# Patient Record
Sex: Female | Born: 1970 | Race: Black or African American | Hispanic: No | Marital: Married | Smoking: Former smoker
Health system: Southern US, Community
[De-identification: ages and names within clinical notes are randomized; demographics above are authoritative.]

## PROBLEM LIST (undated history)

## (undated) DIAGNOSIS — K219 Gastro-esophageal reflux disease without esophagitis: Secondary | ICD-10-CM

## (undated) DIAGNOSIS — T7840XA Allergy, unspecified, initial encounter: Secondary | ICD-10-CM

## (undated) DIAGNOSIS — I10 Essential (primary) hypertension: Secondary | ICD-10-CM

## (undated) DIAGNOSIS — F419 Anxiety disorder, unspecified: Secondary | ICD-10-CM

## (undated) DIAGNOSIS — I493 Ventricular premature depolarization: Secondary | ICD-10-CM

## (undated) DIAGNOSIS — E785 Hyperlipidemia, unspecified: Secondary | ICD-10-CM

## (undated) DIAGNOSIS — R000 Tachycardia, unspecified: Secondary | ICD-10-CM

## (undated) HISTORY — DX: Anxiety disorder, unspecified: F41.9

## (undated) HISTORY — DX: Hyperlipidemia, unspecified: E78.5

## (undated) HISTORY — DX: Gastro-esophageal reflux disease without esophagitis: K21.9

## (undated) HISTORY — DX: Allergy, unspecified, initial encounter: T78.40XA

## (undated) HISTORY — DX: Tachycardia, unspecified: R00.0

## (undated) HISTORY — DX: Ventricular premature depolarization: I49.3

## (undated) HISTORY — PX: TUBAL LIGATION: SHX77

## (undated) HISTORY — PX: WISDOM TOOTH EXTRACTION: SHX21

## (undated) HISTORY — PX: ABDOMINAL HYSTERECTOMY: SHX81

---

## 2006-12-27 ENCOUNTER — Ambulatory Visit: Payer: Self-pay | Admitting: Oncology

## 2007-01-26 ENCOUNTER — Ambulatory Visit: Payer: Self-pay | Admitting: Family Medicine

## 2007-01-26 ENCOUNTER — Encounter (INDEPENDENT_AMBULATORY_CARE_PROVIDER_SITE_OTHER): Payer: Self-pay | Admitting: Family Medicine

## 2007-01-26 DIAGNOSIS — I10 Essential (primary) hypertension: Secondary | ICD-10-CM | POA: Insufficient documentation

## 2007-01-28 ENCOUNTER — Encounter (INDEPENDENT_AMBULATORY_CARE_PROVIDER_SITE_OTHER): Payer: Self-pay | Admitting: Family Medicine

## 2007-01-28 DIAGNOSIS — D649 Anemia, unspecified: Secondary | ICD-10-CM | POA: Insufficient documentation

## 2007-01-28 LAB — CONVERTED CEMR LAB
BUN: 15 mg/dL (ref 6–23)
Calcium: 9.1 mg/dL (ref 8.4–10.5)
Chloride: 103 meq/L (ref 96–112)
Hemoglobin: 11.7 g/dL — ABNORMAL LOW (ref 12.0–15.0)
MCHC: 31.9 g/dL (ref 30.0–36.0)
MCV: 86.4 fL (ref 78.0–100.0)
Platelets: 498 10*3/uL — ABNORMAL HIGH (ref 150–400)
Potassium: 3.7 meq/L (ref 3.5–5.3)
RDW: 15.5 % — ABNORMAL HIGH (ref 11.5–14.0)
Saturation Ratios: 25 % (ref 20–55)
Sodium: 139 meq/L (ref 135–145)
TIBC: 430 ug/dL (ref 250–470)
WBC: 11.6 10*3/uL — ABNORMAL HIGH (ref 4.0–10.5)

## 2007-03-21 ENCOUNTER — Ambulatory Visit: Payer: Self-pay | Admitting: Oncology

## 2007-05-10 ENCOUNTER — Ambulatory Visit: Payer: Self-pay | Admitting: Oncology

## 2007-05-10 LAB — CHCC SMEAR

## 2007-05-10 LAB — CBC WITH DIFFERENTIAL/PLATELET
BASO%: 0.3 % (ref 0.0–2.0)
Eosinophils Absolute: 0.1 10*3/uL (ref 0.0–0.5)
HCT: 35.8 % (ref 34.8–46.6)
LYMPH%: 27.5 % (ref 14.0–48.0)
MCH: 28.5 pg (ref 26.0–34.0)
MCHC: 34.3 g/dL (ref 32.0–36.0)
MONO#: 0.4 10*3/uL (ref 0.1–0.9)
MONO%: 4.3 % (ref 0.0–13.0)
NEUT#: 5.7 10*3/uL (ref 1.5–6.5)
Platelets: 515 10*3/uL — ABNORMAL HIGH (ref 145–400)
RDW: 15.1 % — ABNORMAL HIGH (ref 11.3–14.5)

## 2007-05-10 LAB — ERYTHROCYTE SEDIMENTATION RATE: Sed Rate: 27 mm/hr (ref 0–30)

## 2007-05-10 LAB — IRON AND TIBC: UIBC: 358 ug/dL

## 2007-08-09 ENCOUNTER — Ambulatory Visit: Payer: Self-pay | Admitting: Oncology

## 2007-10-11 ENCOUNTER — Ambulatory Visit: Payer: Self-pay | Admitting: Oncology

## 2007-11-23 ENCOUNTER — Emergency Department (HOSPITAL_COMMUNITY): Admission: EM | Admit: 2007-11-23 | Discharge: 2007-11-23 | Payer: Self-pay | Admitting: Family Medicine

## 2007-11-24 ENCOUNTER — Inpatient Hospital Stay (HOSPITAL_COMMUNITY): Admission: EM | Admit: 2007-11-24 | Discharge: 2007-11-26 | Payer: Self-pay | Admitting: Emergency Medicine

## 2007-12-02 ENCOUNTER — Encounter: Admission: RE | Admit: 2007-12-02 | Discharge: 2007-12-02 | Payer: Self-pay | Admitting: Internal Medicine

## 2007-12-19 ENCOUNTER — Inpatient Hospital Stay (HOSPITAL_COMMUNITY): Admission: EM | Admit: 2007-12-19 | Discharge: 2007-12-22 | Payer: Self-pay | Admitting: Emergency Medicine

## 2008-01-16 ENCOUNTER — Encounter (INDEPENDENT_AMBULATORY_CARE_PROVIDER_SITE_OTHER): Payer: Self-pay | Admitting: *Deleted

## 2008-01-16 ENCOUNTER — Ambulatory Visit (HOSPITAL_COMMUNITY): Admission: RE | Admit: 2008-01-16 | Discharge: 2008-01-17 | Payer: Self-pay | Admitting: Obstetrics

## 2009-09-08 ENCOUNTER — Emergency Department (HOSPITAL_COMMUNITY): Admission: EM | Admit: 2009-09-08 | Discharge: 2009-09-08 | Payer: Self-pay | Admitting: Family Medicine

## 2010-02-12 ENCOUNTER — Encounter: Admission: RE | Admit: 2010-02-12 | Discharge: 2010-02-12 | Payer: Self-pay | Admitting: Emergency Medicine

## 2010-07-27 ENCOUNTER — Encounter: Payer: Self-pay | Admitting: Internal Medicine

## 2010-08-24 ENCOUNTER — Encounter: Payer: Self-pay | Admitting: *Deleted

## 2010-09-02 ENCOUNTER — Inpatient Hospital Stay (INDEPENDENT_AMBULATORY_CARE_PROVIDER_SITE_OTHER)
Admission: RE | Admit: 2010-09-02 | Discharge: 2010-09-02 | Disposition: A | Discharge status: Home / Self Care | Payer: No Typology Code available for payment source | Source: Ambulatory Visit | Attending: Emergency Medicine | Admitting: Emergency Medicine

## 2010-09-02 ENCOUNTER — Ambulatory Visit (INDEPENDENT_AMBULATORY_CARE_PROVIDER_SITE_OTHER): Payer: No Typology Code available for payment source

## 2010-09-02 DIAGNOSIS — S20219A Contusion of unspecified front wall of thorax, initial encounter: Secondary | ICD-10-CM

## 2010-09-02 DIAGNOSIS — S139XXA Sprain of joints and ligaments of unspecified parts of neck, initial encounter: Secondary | ICD-10-CM

## 2010-11-18 NOTE — Op Note (Signed)
NAME:  [name], [name] NO.:  [id number]   MEDICAL RECORD NO.:  [id number]          PATIENT TYPE:  INP   LOCATION:  1340                         FACILITY:  [facility]   PHYSICIAN:  Graylin Shiver, M.D.   DATE OF BIRTH:  [birth year]   DATE OF PROCEDURE:  12/20/2007  DATE OF DISCHARGE:                               OPERATIVE REPORT   PROCEDURE:  Esophagogastroduodenoscopy   INDICATIONS FOR PROCEDURE:  The patient is a 40 year old black female  who presented to the hospital with epigastric abdominal pain.  She had  had this pain previously when she was seen by Dr. Matthias Hughs.  A prior CT  of the abdomen was done during her last admission which showed small-  bowel wall thickening compatible with enteritis.  It was felt to be  either infectious or inflammatory.  The patient represented to the  hospital, again, showing similar but worsening findings on CT scan with  enteritis extending from proximal jejunum and down further into the  jejunum.  She continues to experience epigastric pain.  Endoscopy is  being done to further evaluate the upper GI tract.   Informed consent was obtained after explanation of the risks of  bleeding, infection and perforation.   PREMEDICATIONS:  Fentanyl 100 mcg IV, Versed 9 mg IV.   DESCRIPTION OF PROCEDURE:  With the patient in the left lateral  decubitus position, the Pentax gastroscope was inserted into the  oropharynx and passed down into the esophagus.  It was advanced down the  esophagus, then into the stomach, and into the duodenum.  The scope was  able to be advanced further.  I advanced it well beyond the region of  the papilla of Vater which I was able to see.  I believe that I was able  to advance the scope down into the proximal jejunum area, or least to  the region of the ligament of Treitz.  The scope could not be advanced  further.   The scope was brought out.  The mucosa of the small intestine visualized  all looked normal.  The  stomach mucosa looked normal.  The esophagus  looked normal in its entirety.  After removed the scope, I attempted to  a advance the pediatric gastroscope, but the patient did not tolerate  that advancement into the esophagus.  She continued to gag and could not  tolerate the scope in the oropharynx.  She tolerated the procedure  without complications.   IMPRESSION:  Normal upper endoscopy as described above.   PLAN:  I will order a small bowel follow-through per radiology tomorrow,  and see what this shows.  The patient could have Crohn's disease which  certainly would be a possibility.  We will see what the small-bowel  series shows.  If there is no evidence of stricture, we would then  consider capsule endoscopy.           ______________________________  Graylin Shiver, M.D.     SFG/MEDQ  D:  12/20/2007  T:  12/20/2007  Job:  [id number]   cc:   [employer] [facility]   [employer],  M.D.  Fax: [phone]

## 2010-11-18 NOTE — H&P (Signed)
[name], [name] NO.:  [id number]   MEDICAL RECORD NO.:  [id number]          PATIENT TYPE:  EMS   LOCATION:  ED                           FACILITY:  [facility]   PHYSICIAN:  Michaelyn Barter, M.D. DATE OF BIRTH:  [birth year]   DATE OF ADMISSION:  12/19/2007  DATE OF DISCHARGE:                              HISTORY & PHYSICAL   PRIMARY CARE DOCTOR:  Unassigned.   CHIEF COMPLAINT:  Abdominal pain.   HISTORY OF PRESENT ILLNESS:  [name] is a 40 year old female who was  recently admitted and treated during the time-frame of Nov 24, 2007 up  until Nov 26, 2007, for abdominal pain related to enteritis.  At that  particular time, she was treated with a combination of IV ciprofloxacin  and Flagyl.  Her abdominal pain improved and she was subsequently  discharged home on p.o. antibiotics.  She indicates that she completed  the course of antibiotics during the end of May, and appeared to be  symptom-free up until Saturday night which is two nights ago, at which  time she developed centrally located upper epigastric pain.  She states  that the pain that she currently is experiencing is identical to that  she experienced during her recent hospitalization.  Last night at  approximately 11:45 p.m., she began to vomit.  She states that she has  had at least six episodes of emesis since last night.  She denies having  any fevers or chills.  There are no aggravating factors.  Her pain again  is very similar to that, that occurred during her recent presentation.   PAST MEDICAL HISTORY:  1. Significant for enteritis which was diagnosed May 2009.  2. Hypertension.   ALLERGIES:  NO KNOWN DRUG ALLERGIES.   HOME MEDICATIONS:  1. Lisinopril.  2. Protonix.   SOCIAL HISTORY:  Cigarettes.  The patient denies alcohol.   FAMILY HISTORY:  The patient's mother has a history of hypertension.   REVIEW OF SYSTEMS:  As per HPI, otherwise all other systems are  negative.   PHYSICAL  EXAMINATION:  GENERAL:  The patient is awake.  She is  cooperative.  She is in no obvious respiratory distress.  VITAL SIGNS:  Her temperature is 98.2.  Blood pressure 110/75, heart  rate 108, respirations 20.  O2 sat 97% on room air.  HEENT:  Normocephalic, atraumatic.  Anicteric.  Extraocular movements  are intact.  Pupils are slightly constricted and there is decreased  reactivity to light bilaterally.  Oral mucosa is pink.  No thrush, no  exudates.  NECK:  Supple.  No JVD, no lymphadenopathy, no thyromegaly.  CARDIAC:  S1-S2 present.  Regular rate and rhythm.  RESPIRATORY:  No crackles or wheezes.  ABDOMEN:  Flat, soft, nondistended.  There is some generalized  discomfort to palpation.  There is no rebound or guarding.  No masses  palpated.  Bowel sounds are present, however, hypoactive.  EXTREMITIES:  No leg edema.  NEUROLOGIC:  The patient is alert and oriented x3.  MUSCULOSKELETAL:  5/5 upper and lower extremity strength.   LABORATORY DATA:  Pregnancy test is negative.  Urinalysis; WBCs are 3-6.  WBC 26.8, hemoglobin 12.7, hematocrit 38.1, platelet count 425.  Sodium  138, potassium 3.0, chloride 104, glucose 128, BUN 8, creatinine 0.8.  A  CT scan of the patient's abdomen revealed interval worsening in small  bowel wall thickening, compatible with infectious or inflammatory  enteritis.  There is no abscess or free air.  CT scan of the pelvis  reveals small to moderate volume of pelvic ascites.   ASSESSMENT/PLAN:  1. Acute abdominal pain.  The etiology of this is most likely      secondary to enteritis that is depicted on the patient's CAT scan.      Will start the patient on empiric IV antibiotics.  In light of the      radiologist indicating that there has been an interval worsening as      seen on CAT scan, will consult gastroenterology for further      recommendations.  Will also provide the patient with p.r.n. pain      medications to control her symptoms.  2. Nausea and  emesis.  Will initiate p.r.n. antiemetics.  3. Leukocytosis.  This is most likely secondary to the enteritis that      is depicted on the CAT scan.  Will again start the patient on      empiric IV antibiotics.  4. Hypokalemia.  Will provide potassium supplementation.  5. Gastrointestinal prophylaxis, will provide Protonix.      Michaelyn Barter, M.D.  Electronically Signed     OR/MEDQ  D:  12/19/2007  T:  12/19/2007  Job:  [id number]

## 2010-11-18 NOTE — Consult Note (Signed)
NAMEMarland Kitchen  Melissa, Reed NO.:  [id number]   MEDICAL RECORD NO.:  [id number]          PATIENT TYPE:  INP   LOCATION:  1530                         FACILITY:  [facility]   PHYSICIAN:  Bernette Redbird, M.D.   DATE OF BIRTH:  [birth year]   DATE OF CONSULTATION:  11/26/2007  DATE OF DISCHARGE:  11/26/2007                                 CONSULTATION   Dr. 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   [name] has enjoyed excellent health in the past apart from high blood  pressure, and specifically has no prior history of GI problems.   With that background, approximately 6 days ago she began to have sharp,  crampy upper abdominal pain characterized by food intolerance and  ultimately some nausea and vomiting.  She was seen at an Urgent Care  Center, sent home, did poorly at home and ultimately came back to the  emergency room 2 days ago and was admitted, at which time an abdominal  and pelvic CT showed marked thickening of a segment of the mid small  bowel (terminal ileum looked normal) and a small amount of free fluid  within the pelvis.  The patient was not febrile and no specific etiology  for her symptoms and findings was identified, but she was admitted and  treatment was initiated with IV Cipro and Flagyl.  Over the ensuing 48  hours since admission, she has felt progressively better and at this  point is asymptomatic, tolerating a regular diet without abdominal pain,  nausea or vomiting.   Dr. Roxan Hockey wanted our opinion as to whether or not any further  evaluation might be needed, and to assist with follow-up arrangements as  might be needed in light of the radiographic findings.   PAST MEDICAL HISTORY:   ALLERGIES:  No known allergies.   OUTPATIENT MEDICATIONS:  Lisinopril, HCTZ, Prevacid p.r.n.,  Diflucan,  Vicodin and Cipro (the latter 4 medicines were just started with the  current illness).   OPERATIONS:  None.   CHRONIC MEDICAL ILLNESSES:  Hypertension, otherwise negative.   HABITS:  Nonsmoker, nondrinker.   FAMILY HISTORY:  Sister has irritable bowel and gallbladder trouble,  otherwise negative for GI illnesses and specifically negative for  inflammatory bowel disease or celiac disease.   SOCIAL HISTORY:  The patient is married.  Her husband is at the bedside.  She is about to complete a program in sign language at [employer]   REVIEW OF SYSTEMS:  Negative for essentially any upper or lower tract GI  symptoms.  She does report that when she first starts eating in the  morning, her stomach feels tight but then it loosens up and she feels  okay.  This is not a problem for her later in the day.  No problem loss  of appetite, loss of weight, chronic abdominal pain, reflux symptoms,  dysphagia, anorexia, constipation, diarrhea or rectal bleeding.   PHYSICAL EXAM:  A lean, healthy-appearing, vivacious, pleasant,  articulate African  American female in no acute distress.  Neither  anxious nor depressed.  She is anicteric without frank pallor.  Chest and heart are clear.  The abdomen has normal bowel sounds and no organomegaly, guarding, mass  or tenderness.   LABS:  Admission white count was 11,300 and the next day it had fallen  to the normal range of 10,200.  Hemoglobin following admission was 10  with an MCV of 89, platelet count normal at 341,000.  Admission  differential showed 85 polys, 11 lymphs, 4 monocytes.  Chemistry panel  shows normal BUN, creatinine, electrolytes and glucose on admission.  Fecal lactoferrin is pending.  Blood culture so far is negative.  CT  scan:  I have reviewed the CT scan with the radiologist.  There is a  segment of small bowel, neither in the duodenum nor apparently in the  terminal ileum but perhaps in the more proximal ileum or jejunum; it  is  markedly thickened.  There is also a little bit of free fluid in the  abdominal cavity.  There is no evidence of vascular calcifications or  vascular disease.   DISCUSSION AND PLAN:  Almost certainly this was an infectious process.  A vascular etiology seems unlikely given the patient's age and  radiographic findings and the fact that only a short segment of bowel  was involved and that it had sort of a stuttering subacute onset rather  than an abrupt onset.  I doubt that she has a chronic underlying  inflammatory bowel disease process given the distribution of abnormality  and the rapid onset and resolution.   RECOMMENDATIONS:  1. Okay for discharge at this time.  2. When she goes home, I will probably give 3 additional days of Cipro      and Flagyl empirically, since she improved on that medication      (whether coincidentally or causally related is not clear).  3. I have asked the patient to follow up with me in the office in      about 3 weeks and have given her my card for that purpose.  At that      time, we might obtain a tissue transglutaminase, stool Hemoccults,      and probably we will arrange for a repeat CT scan of the abdomen to      verify resolution of the current abnormalities.  If they persist,      then the patient may be a candidate for a small bowel capsule      endoscopy.   I appreciate the opportunity to have seen this patient in consultation  with you.           ______________________________  Bernette Redbird, M.D.     RB/MEDQ  D:  11/26/2007  T:  11/26/2007  Job:  [id number]

## 2010-11-18 NOTE — H&P (Signed)
[name], [name]             ACCOUNT NO.:  [id number]   MEDICAL RECORD NO.:  [id number]          PATIENT TYPE:  INP   LOCATION:  1530                         [facility]ACILITY:  [facility]   PHYSICIAN:  Madaline Savage, MD        DATE OF BIRTH:  1970-11-22   DATE OF ADMISSION:  11/24/2007  DATE OF DISCHARGE:                              HISTORY & PHYSICAL   PRIMARY CARE PHYSICIAN:  This patient is unassigned to [place].   CHIEF COMPLAINTS:  Abdominal pain.   HISTORY OF PRESENT ILLNESS:  Ms. Mcghie is a 40 year old African  American lady with a history of hypertension who comes in with abdominal  pain for the last 4 days.  She states her pain started approximately 4  days ago which was located in the upper abdomen which has progressively  been getting worse.  The pain is described as a sharp pain and it goes  up to a 10 out of 10 on a scale of 1-10.  She says the pain has a waxing  and waning nature.  On Tuesday after having her lunch, the pain  worsened, and she felt like having spasms.  She got nauseated and  vomited times one.  That date she went to the Urgent Care.  At that  time, she was told that this is likely secondary to reflux, and she was  started on Prevacid, Diflucan and ciprofloxacin.  Symptoms did not  improve in spite of being on these medications, and so she decided to  come to the ER.  In the ER, she vomited again once after taking the  contrast for the CT scan.  She denies any fever or any chills.  She  denies any diarrhea.  She denies any sick contacts.   MEDICAL HISTORY:  History of hypertension.   PAST SURGICAL HISTORY:  Tubal ligation.   ALLERGIES:  NO KNOWN DRUG ALLERGIES.   CURRENT MEDICATIONS:  1. Lisinopril 20 mg daily.  2. Hydrochlorothiazide 25 grams daily.  3. She was started on Prevacid, Diflucan and Vicodin and ciprofloxacin      2 days ago from the Urgent Care.   SOCIAL HISTORY:  She is married.  She has three kids that are healthy.  She is a Chief of Staff.  Denies any history of smoking, alcohol,  drug abuse.   FAMILY HISTORY:  Her father is 31.  He is healthy.  Mother is 63, who  has hypertension.   REVIEW OF SYSTEMS:  GENERAL:  She denies any recent weight loss or  weight gain.  No fever or chills.  HEENT:  No headaches, no blurred  vision or sore throat.  CARDIOVASCULAR:  Denies chest pain, palpitations.  RESPIRATORY:  No  shortness breath or cough.  GI:  She does have abdominal pain and  nausea, no diarrhea or constipation. GENITOURINARY:  Denies dysuria or  polyuria.  EXTREMITIES:  No tingling, numbness of toes or fingers.  NEUROLOGICAL:  She is alert and oriented x3.   PHYSICAL EXAMINATION:  VITAL SIGNS:  Temperature 98, pulse rate of 102,  pulse rate 18, blood pressure is  108/79, oxygen sats were 97% room air.  HEENT:  Head atraumatic, normocephalic.  Pupils bilaterally equal and  reacting to light.  Mucous membranes appear moist.  NECK:  Supple.  No JVD, no carotid bruit.  CARDIOVASCULAR:.  S1, S2 heard.  Regular rate and rhythm.  No murmurs,  rubs or gallops.  CHEST:  Clear to auscultation.  ABDOMEN:  There is minimal epigastric tenderness.  There is decreased  bowel sounds.  No hepatosplenomegaly.  EXTREMITIES: No edema, cyanosis or clubbing.   LABORATORY DATA:  White count of 13.8, hemoglobin 13.5, hematocrit 40.8,  platelets 528.  Sodium is 136, potassium 3.7, chloride 101, bicarb is  24, BUN 15, creatinine of 0.8, glucose 119, calcium is 9.1, lipase is  36.  Urinalysis was negative.  Urine pregnancy test was negative.  AST  is 14, ALT 8, alkaline phosphatase was 54, total bilirubin is 0.8,  albumin 3.5.  CT of the abdomen and pelvis done in the ED showed  extensive small bowel thickening with sinusitis and findings were  suggestive for small bowel inflammatory process and enteritis.  Oral  contrast is only in the proximal small bowel and an obstructive process  cannot be excluded.  The CT of the pelvis showed  lesions along the  uterine fundus that measures to 0.9 cm for which is suspicious for a  fibroid and there is free fluid in the pelvis.   IMPRESSION:  1. Small bowel enteritis, infectious versus inflammatory  2. Leukocytosis.  3. History of hypertension.  4. Possible uterine fibroid.   PLAN:  1. This is a 40 year old lady who comes in with abdominal pain and CT      scan findings consistent with small-bowel enteritis.  This is      likely infectious versus inflammatory.  I will start her      empirically on Flagyl and Cipro at this time.  Will check for stool      for WBC and will consult the gastroenterologist for possible      endoscopy to rule out inflammatory bowel disease.  I will keep her      n.p.o. for now and will follow her clinically.  Her CT scan cannot      rule out an obstruction.  We will get a follow-up abdominal CT      tomorrow.  I will put her on IV fluids and narcotics for pain.  At      this time, her blood pressure is well-controlled.  We will restart      her blood pressure medications when she starts to take pills.  I      will put her on DVT prophylaxis.      Madaline Savage, MD  Electronically Signed     PKN/MEDQ  D:  11/24/2007  T:  11/24/2007  Job:  [id number]

## 2010-11-18 NOTE — Op Note (Signed)
[name], [name]             ACCOUNT NO.:  [id number]   MEDICAL RECORD NO.:  [id number]          PATIENT TYPE:  OIB   LOCATION:  9320                          FACILITY:  [facility]   PHYSICIAN:  [place] B. Earlene Plater, M.D.  DATE OF BIRTH:  [birth year]   DATE OF PROCEDURE:  DATE OF DISCHARGE:                               OPERATIVE REPORT   PREOPERATIVE DIAGNOSES:  1. Menorrhagia.  2. Dysmenorrhea.   POSTOPERATIVE DIAGNOSES:  1. Menorrhagia.  2. Dysmenorrhea.   PROCEDURE:  Total laparoscopic hysterectomy and cystoscopy.   SURGEON:  Verne Carrow, MD.   ASSISTANT:  Alphonzo Severance, MD.   ANESTHESIA:  General.   FINDINGS:  180 gram uterus and cervix, endometriosis left ovary ablated  with harmonic.   SPECIMENS:  To pathology.   ESTIMATED BLOOD LOSS:  100 mL.   COMPLICATIONS:  None.   INDICATIONS:  The patient with above history for definitive surgical  therapy.  Advised the risks of surgery including infection, bleeding,  the damage to surrounding organs.   PROCEDURE:  The patient was taken to the operating room and general  anesthesia obtained.  She was prepped and draped in standard fashion.  Foley catheter was inserted into the bladder.  Uterus sounded to 8 cm  with #8 RUMI tip inserted and secured.   A 10-mm incision placed in the umbilicus carried sharply upto the  fascia.  The fascia was divided sharply and elevated with Kocher clamps.  Posterior sheath and peritoneum were elevated and entered sharply.  Purse-string 0-vicryl placed around the facial defect.  Hasson cannula  inserted and secured.  Pneumoperitoneum obtained with CO2 gas.   A 5-mm ancillary port was placed at the left and right periumbilical and  left lower quadrant all under direct laparoscopic visualization.   Trendelenburg position is obtained, bowel mobilized superiorly, course  of the ureter identified and found to be well away.  Left round  ligament placed on traction, sealed and divided with  the harmonic ace.  Left tube did have some endometriosis implants in the line of  dissection.  This was sealed and divided with the harmonic.  The uterine  ovarian ligament sealed and divided.  Posterior leaf of the broad  ligament was skeletonized with the harmonic.  bladder flap created  sharply on the left side with the harmonic and bladder mobilized caudad.  The the KOH ring was elevated and the uterine artery skeletonized at the  level of the KOH ring.  Artery was then sealed and divided with bipolar.  Entire procedure was repeated on the right side in the same manner.  Colpotomy then made anteriorly and posteriorly with the harmonic, angle  was taken down with combination of bipolar and harmonic.  Uterus  delivered into the vagina, which maintained pneumoperitoneum.  Bleeder  noted from the right angle which was arterial bleeding.  This was made  hemostatic with figure-of-eight stitch of PDO Quill.  Remainder of the  vaginal cuff was closed with PDO Quill in running fashion.  Given the  angle stitch was a little bit lateral indigo carmine was given while  performing vaginal  cuff closure.   The pneumoperitoneum was taken down and the cuff inspected and was  hemostatic.  Cystoscopy was then performed, bilateral ureteral reflux  noted.  No bladder injury seen.   Laparoscopy then resumed and the angle re-inspected on the right.  It  was no longer bleeding.  Pelvis was copiously irrigated.  Hemostasis  noted.  Inferior ports removed, the sites were hemostatic.  Scope were  removed, gas released, Hasson cannula removed.  Umbilical incision  elevated with [employer] retractors.  Pursestring sutures down.  This  obliterated the facial defect and no intra-abdominal content were noted  prior to closure.  Skin is closed with 4-0 Vicryl and Dermabond.   The patient tolerated the procedure well.  There were no complications.  The patient was taken to the recovery room, awake, alert, and in  stable  condition with all counts correct per the operating room staff.      Gerri Spore B. Earlene Plater, M.D.  Electronically Signed     WBD/MEDQ  D:  01/16/2008  T:  01/17/2008  Job:  [id number]

## 2010-11-18 NOTE — Consult Note (Signed)
[name], STIGGERS             ACCOUNT NO.:  [id number]   MEDICAL RECORD NO.:  [id number]          PATIENT TYPE:  INP   LOCATION:  1340                         FACILITY:  [facility]   PHYSICIAN:  Graylin Shiver, M.D.   DATE OF BIRTH:  September 10, 1970   DATE OF CONSULTATION:  12/20/2007  DATE OF DISCHARGE:                                 CONSULTATION   We were asked to see Ms. Wilhoite by Dr. Lavera Guise for jejunal thickening and  recurrent abdominal pain.   HISTORY OF PRESENT ILLNESS:  This is a very pleasant 40 year old female  who was in the hospital with the same central epigastric pain on May  21st, 2009.  She was seen by Dr. Matthias Hughs at that time and treated with  antibiotics.  She improved and went home.  She finished her Cipro and  Flagyl approximately 2 weeks ago and was due to follow up with Dr.  Matthias Hughs when her pain started again Saturday night after eating a  cheeseburger.  She describes some extensive vomiting but no hematemesis.  She also is experiencing dry heaves.  She has had no bowel movement  since Saturday, no heartburn, acid reflux, or heavy NSAID use.   PAST MEDICAL HISTORY:  Significant for hypertension and uterine  fibroids.  She has had a bilateral tubal ligation and is due for a  hysterectomy next month.   CURRENT MEDICATIONS:  Include lisinopril and Protonix.   She has no known drug allergies.   SOCIAL HISTORY:  Negative for alcohol and tobacco.  She is married.   FAMILY HISTORY:  Significant for gallbladder disease in her sister and  irritable bowel syndrome but no irritable bowel disease or other  autoimmune disorders.   REVIEW OF SYSTEMS:  Positive for palpitations when her pain occurs.  The  rest is per HPI.   PHYSICAL EXAM:  She is alert and oriented in no apparent distress, very  pleasant to speak with.  Her temperature is 99, pulse is 78, respirations are 18, blood pressure  is 115/78.  Heart and lungs are clear.  ABDOMEN:  Soft, nondistended, tender in  the right upper quadrant.  She  has good bowel sounds.   LABS:  She was admitted with a white count of 26.8 that has dropped to  10.0 today, hemoglobin 9.2 with an MCV value of 83.2, hematocrit 27.3,  platelets 328,000.  BMET is within normal limits.  LFTs are within  normal limits.  Her serum albumin is 2.6.  CT scan done yesterday shows  interval worsening in the small bowel wall thickening, compatible with  infectious or inflammatory enteritis.  This area extends from the  proximal small-bowel into the jejunum.  There is no abscess or free air.  She does have moderate pelvic ascites.   ASSESSMENT:  Dr. Herbert Moors has seen and examined the patient,  collected a history, and reviewed her chart.  His impression is that  this is a 40 year old female who has enteritis on CT scan with  epigastric pain and vomiting.  Will proceed with endoscopy/enteroscopy  this afternoon.  Thanks very much for this consultation.  Stephani Police, PA    ______________________________  Graylin Shiver, M.D.    MLY/MEDQ  D:  12/20/2007  T:  12/20/2007  Job:  [id number]   cc:   Lonia Blood, M.D.   Graylin Shiver, M.D.  Fax: [phone]   Bernette Redbird, M.D.  Fax: [phone]

## 2010-11-18 NOTE — Discharge Summary (Signed)
[name], [name]             ACCOUNT NO.:  [id number]   MEDICAL RECORD NO.:  [id number]          PATIENT TYPE:  INP   LOCATION:  1340                         FACILITY:  [facility]   PHYSICIAN:  Hillery Aldo, M.D.   DATE OF BIRTH:  [birth year]   DATE OF ADMISSION:  12/19/2007  DATE OF DISCHARGE:  12/22/2007                               DISCHARGE SUMMARY   PRIMARY CARE PHYSICIAN:  Dr. Leveda Anna at the [facility].   GASTROENTEROLOGIST:  Dr. Ewing Schlein.   DISCHARGE DIAGNOSES:  1. Recurrent enteritis  2. Hypokalemia.  3. Hypertension.  4. Normocytic anemia.   DISCHARGE MEDICATIONS:  1. Lisinopril 20 mg daily.  2. Protonix 40 mg daily.  3. Cipro 500 mg b.i.d. times 11 days.  4. Flagyl 500 mg t.i.d. times 11 days.   CONSULTATIONS:  Dr. Ewing Schlein, of gastroenterology.   BRIEF ADMISSION HPI:  The patient is a 41 year old female who has a past  medical history of enteritis and presented to the hospital with  recurrent problems with abdominal pain.  She was actually hospitalized  at the end of May this year for a similar presentation.  She was treated  with Cipro and Flagyl at that time and was subsequently discharged home.  She has been doing well up until recently when she began to develop  recurrent problems with upper epigastric pain.  For the full details,  please see the dictated report done by Dr. Roxan Hockey.  Nevertheless, she  was found to have evidence of recurrent enteritis and therefore was  admitted for further evaluation and treatment.   PROCEDURES AND DIAGNOSTIC STUDIES:  1. CT scan of the abdomen and pelvis on December 19, 2007 showed interval      worsening and small bowel wall thickening compatible with      infectious or inflammatory enteritis.  No abscess or free air.      Small to moderate volume of pelvic ascites.  2. One-view of the abdomen on December 21, 2007 showed nonspecific      nonobstructive bowel gas pattern.  Contrast material noted  throughout the colon from recent CT scan.  3. Upper GI series with small-bowel follow-through on December 22, 2007      showed no pathological findings.  The abnormalities on the recent      CT scan appeared to have completely resolved.  4. Upper endoscopy on December 20, 2007 was normal with the scope advanced      well into the proximal jejunum.   DISCHARGE LABORATORY VALUES:  Sodium was 134, potassium 3.5, chloride  104, bicarb 24, BUN 1, creatinine 0.56, glucose 87.  White blood cell  count was 8.8, hemoglobin 10, hematocrit 29.9, platelets 350,000.   HOSPITAL COURSE BY PROBLEM:  1. Recurrent enteritis:  The patient was treated in May for a bout of      what was presumed to be an infectious enteritis.  One month later      she returned with similar symptoms and worsening on radiographic      imaging.  Given this, a GI consultation was requested and kindly  provided by Dr. Ewing Schlein.  The patient underwent upper endoscopy with      no visible abnormalities noted.  She also underwent upper GI series      with a small-bowel follow-through, which did not show any      abnormalities.  At this time, her symptoms have resolved on empiric      therapy with Cipro and Flagyl.  We will continue on a full 2-week      course of antibiotic therapy and have her follow up with Dr. Ewing Schlein      as an outpatient for consideration of a capsule endoscopy.  Tissue      transglutaminase antibodies were also checked and found to be      negative.  2. Hypokalemia:  The patient was appropriately repleted.  3. Hypertension:  The patient's blood pressure has been reasonable.      She can resume her lisinopril on discharge.  4. Normocytic anemia:  Likely secondary to menorrhagia in this      menstruating female.  Heme check of her stools have been negative.      An anemia panel has been obtained but is pending at the time of      this dictation.   DISPOSITION:  The patient will be discharged home later today if she  is  able to tolerate a regular diet and if cleared by Dr. Ewing Schlein of  gastroenterology.  She should follow up with her primary care physician  as needed and with Dr. Ewing Schlein next week for consideration of capsule  endoscopy as an outpatient.      Hillery Aldo, M.D.  Electronically Signed     CR/MEDQ  D:  12/22/2007  T:  12/22/2007  Job:  [id number]   cc:   William A. Leveda Anna, M.D.   Petra Kuba, M.D.  Fax: [phone]

## 2010-11-21 NOTE — Discharge Summary (Signed)
NAMESANII, [name]             ACCOUNT NO.:  [id number]   MEDICAL RECORD NO.:  [id number]          PATIENT TYPE:  INP   LOCATION:  1530                         FACILITY:  [facility]   PHYSICIAN:  Hillery Aldo, M.D.   DATE OF BIRTH:  08/04/1970   DATE OF ADMISSION:  11/23/2007  DATE OF DISCHARGE:  11/26/2007                               DISCHARGE SUMMARY   PRIMARY CARE PHYSICIAN:  Dr. Audria Nine at [facility].   DISCHARGE DIAGNOSES:  1. Abdominal pain.  2. Hypokalemia.  3. Hypertension.  4. Leukocytosis  5. Gastroenteritis.  6. Right renal cysts.  7. Uterine fibroid.   DISCHARGE MEDICATIONS:  1. Cipro 250 mg every 12 hours x3 days.  2. Metronidazole 500 mg every 8 hours x3 days.  3. Protonix 40 mg daily.  4. Lisinopril 20 mg daily.  5. Hydrochlorothiazide 25 mg daily.  6. Prevacid 30 mg daily.  7. Vicodin 5/500 one to two every 6 hours p.r.n.   CONSULTATIONS:  Dr. Matthias Hughs of gastroenterology.   BRIEF ADMISSION HPI:  Patient is a 40 year old female who presented to  the hospital with chief complaint of abdominal pain for approximately 4  days prior to presentation.  She described the pain as epigastric and  sharp in nature.  She was admitted for further evaluation and workup.  For full details, please see the dictated report done by Dr. Darene Lamer.   PROCEDURES AND DIAGNOSTIC STUDIES:  1. CT scan of the abdomen and pelvis on Nov 24, 2007, showed extensive      small bowel wall thickening with ascites.  Findings are suggestive      for small bowel inflammatory process and enteritis.  Oral contrast      was only in the proximal small bowel and an obstructive process      could not be completely excluded or right renal cysts.  Findings in      pelvis included free fluid and a probable uterine fibroid.  2. Acute abdominal series on Nov 25, 2007, showed no acute      cardiopulmonary process.  Persistently dilated small bowel loops      without evidence for obstruction.   DISCHARGE LABORATORY VALUES:  Blood cultures were completely negative.  Sodium was 137, potassium 3.3, chloride 105, bicarb 26, BUN 3,  creatinine 0.62, glucose 94.   HOSPITAL COURSE BY PROBLEM:  1. Abdominal pain secondary to enteritis:  Patient was admitted and      treated conservatively with empiric antibiotics including Flagyl      and Cipro.  Because of the finding on CT scanning, GI consultation      was requested and kindly provided by Dr. Matthias Hughs.  Dr. Matthias Hughs felt      that this was most likely an acute self-limited process and that      the patient did not need any further diagnostic evaluation.  He      recommended 3 more days of Cipro and Flagyl on Nov 26, 2007, and      cleared her for discharge.  He also recommended that she follow up      with him  and 3 weeks time to repeat a CT scan to confirm      resolution.  Accordingly, the patient was provided with Dr.      Donavan Burnet named and contact information and advised to call for an      appointment in 2 to 3 weeks from discharge.  Over the course of her      hospital stay, her abdominal pain seemed to improve.  There is no      evidence of any obstructive process.  2. Hypokalemia:  The patient's potassium was appropriately repleted      prior to discharge.  3. Hypertension:  The patient's blood pressure remained stable.   DISPOSITION:  The patient was deemed medically stable for discharge on,  Nov 26, 2007 and instructed to follow up Dr. Matthias Hughs as described above.      Hillery Aldo, M.D.  Electronically Signed     CR/MEDQ  D:  02/08/2008  T:  02/08/2008  Job:  [id number]   cc:   Maurice March, M.D.  Fax: [phone]

## 2011-03-11 ENCOUNTER — Inpatient Hospital Stay (INDEPENDENT_AMBULATORY_CARE_PROVIDER_SITE_OTHER)
Admission: RE | Admit: 2011-03-11 | Discharge: 2011-03-11 | Disposition: A | Discharge status: Home / Self Care | Payer: BC Managed Care – PPO | Source: Ambulatory Visit | Attending: Emergency Medicine | Admitting: Emergency Medicine

## 2011-03-11 DIAGNOSIS — J019 Acute sinusitis, unspecified: Secondary | ICD-10-CM

## 2011-04-01 LAB — COMPREHENSIVE METABOLIC PANEL
Albumin: 3.5
Alkaline Phosphatase: 54
BUN: 15
CO2: 24
Chloride: 101
GFR calc non Af Amer: >60
Glucose, Bld: 119 — ABNORMAL HIGH
Potassium: 3.7
Total Bilirubin: 0.8
Total Protein: 7.6

## 2011-04-01 LAB — POCT URINALYSIS DIP (DEVICE)
Glucose, UA: NEGATIVE
pH: 5.5

## 2011-04-01 LAB — BASIC METABOLIC PANEL
BUN: 10
BUN: 3 — ABNORMAL LOW
CO2: 22
CO2: 27
Calcium: 7.8 — ABNORMAL LOW
Calcium: 8 — ABNORMAL LOW
Chloride: 107
Creatinine, Ser: 0.61
Creatinine, Ser: 0.62
GFR calc Af Amer: >60
GFR calc non Af Amer: >60
Glucose, Bld: 94
Glucose, Bld: 95
Sodium: 137
Sodium: 142

## 2011-04-01 LAB — CBC
HCT: 33.2 — ABNORMAL LOW
HCT: 40.8
Hemoglobin: 10 — ABNORMAL LOW
Hemoglobin: 11.1 — ABNORMAL LOW
MCHC: 33.1
Platelets: 528 — ABNORMAL HIGH
RBC: 4.01
RDW: 16.8 — ABNORMAL HIGH
WBC: 11.3 — ABNORMAL HIGH
WBC: 13.8 — ABNORMAL HIGH

## 2011-04-01 LAB — DIFFERENTIAL
Basophils Absolute: 0.3 — ABNORMAL HIGH
Eosinophils Absolute: 0
Eosinophils Relative: 0
Lymphocytes Relative: 11 — ABNORMAL LOW
Lymphs Abs: 1.2
Lymphs Abs: 1.7
Monocytes Absolute: 0.4
Monocytes Relative: 4
Monocytes Relative: 5
Neutro Abs: 11.1 — ABNORMAL HIGH
Neutrophils Relative %: 80 — ABNORMAL HIGH

## 2011-04-01 LAB — POCT PREGNANCY, URINE: Operator id: 239701

## 2011-04-01 LAB — LIPASE, BLOOD: Lipase: 36

## 2011-04-01 LAB — CULTURE, BLOOD (ROUTINE X 2)

## 2011-04-02 LAB — RETICULOCYTES
RBC.: 3.67 — ABNORMAL LOW
Retic Count, Absolute: 33
Retic Ct Pct: 0.9

## 2011-04-02 LAB — COMPREHENSIVE METABOLIC PANEL
ALT: 9
AST: 11
Albumin: 2.6 — ABNORMAL LOW
Alkaline Phosphatase: 40
Calcium: 7.5 — ABNORMAL LOW
GFR calc Af Amer: >60
Glucose, Bld: 70
Potassium: 3.6
Sodium: 136
Total Protein: 5.5 — ABNORMAL LOW

## 2011-04-02 LAB — FERRITIN: Ferritin: 18 (ref 10–291)

## 2011-04-02 LAB — BASIC METABOLIC PANEL
BUN: 1 — ABNORMAL LOW
BUN: 1 — ABNORMAL LOW
CO2: 23
CO2: 24
Calcium: 8.2 — ABNORMAL LOW
Chloride: 107
Creatinine, Ser: 0.64
GFR calc non Af Amer: >60
Glucose, Bld: 86
Glucose, Bld: 87
Potassium: 3.2 — ABNORMAL LOW
Sodium: 134 — ABNORMAL LOW

## 2011-04-02 LAB — CBC
HCT: 28.5 — ABNORMAL LOW
HCT: 30.8 — ABNORMAL LOW
Hemoglobin: 10 — ABNORMAL LOW
Hemoglobin: 12.7
Hemoglobin: 9.2 — ABNORMAL LOW
Hemoglobin: 10.3 — ABNORMAL LOW
MCHC: 33.1
MCHC: 33.4
MCHC: 33.6
MCHC: 32.9
MCHC: 33.5
MCV: 83.2
MCV: 85.4
Platelets: 336
Platelets: 350
Platelets: 425 — ABNORMAL HIGH
Platelets: 425 — ABNORMAL HIGH
RBC: 3.61 — ABNORMAL LOW
RBC: 4.26
RDW: 16.9 — ABNORMAL HIGH
RDW: 17.1 — ABNORMAL HIGH
RDW: 17.1 — ABNORMAL HIGH
RDW: 17.3 — ABNORMAL HIGH
RDW: 16.3 — ABNORMAL HIGH
RDW: 16.6 — ABNORMAL HIGH
WBC: 12 — ABNORMAL HIGH

## 2011-04-02 LAB — URINALYSIS, ROUTINE W REFLEX MICROSCOPIC
Bilirubin Urine: NEGATIVE
Nitrite: NEGATIVE
Protein, ur: NEGATIVE
Specific Gravity, Urine: 1.029
Urobilinogen, UA: 0.2

## 2011-04-02 LAB — DIFFERENTIAL
Basophils Absolute: 0
Basophils Absolute: 0
Basophils Relative: 0
Eosinophils Absolute: 0
Eosinophils Absolute: 0.1
Eosinophils Relative: 1
Lymphocytes Relative: 13
Lymphocytes Relative: 25
Lymphs Abs: 1.9
Monocytes Absolute: 0.4
Monocytes Relative: 5
Neutro Abs: 5.1
Neutrophils Relative %: 85 — ABNORMAL HIGH
Neutrophils Relative %: 68

## 2011-04-02 LAB — URINE CULTURE

## 2011-04-02 LAB — SEDIMENTATION RATE: Sed Rate: 42 — ABNORMAL HIGH

## 2011-04-02 LAB — PREGNANCY, URINE
Preg Test, Ur: NEGATIVE
Preg Test, Ur: NEGATIVE

## 2011-04-02 LAB — IRON AND TIBC: UIBC: 293

## 2011-04-02 LAB — POCT I-STAT, CHEM 8
Chloride: 104
HCT: 40
Hemoglobin: 13.6
Potassium: 3 — ABNORMAL LOW
Sodium: 138

## 2011-04-02 LAB — OCCULT BLOOD X 1 CARD TO LAB, STOOL: Fecal Occult Bld: NEGATIVE

## 2011-04-02 LAB — URINE MICROSCOPIC-ADD ON

## 2011-04-02 LAB — TISSUE TRANSGLUTAMINASE, IGG: Tissue Transglut Ab: 0 U/mL (ref <7)

## 2011-04-02 LAB — MONONUCLEOSIS SCREEN: Mono Screen: NEGATIVE

## 2011-11-05 ENCOUNTER — Encounter (HOSPITAL_COMMUNITY): Payer: Self-pay | Admitting: Emergency Medicine

## 2011-11-05 ENCOUNTER — Emergency Department (HOSPITAL_COMMUNITY)
Admission: EM | Admit: 2011-11-05 | Discharge: 2011-11-05 | Disposition: A | Discharge status: Home / Self Care | Payer: Self-pay | Attending: Emergency Medicine | Admitting: Emergency Medicine

## 2011-11-05 ENCOUNTER — Emergency Department (HOSPITAL_COMMUNITY): Payer: Self-pay

## 2011-11-05 DIAGNOSIS — R55 Syncope and collapse: Secondary | ICD-10-CM | POA: Insufficient documentation

## 2011-11-05 DIAGNOSIS — Z79899 Other long term (current) drug therapy: Secondary | ICD-10-CM | POA: Insufficient documentation

## 2011-11-05 DIAGNOSIS — R42 Dizziness and giddiness: Secondary | ICD-10-CM | POA: Insufficient documentation

## 2011-11-05 DIAGNOSIS — I10 Essential (primary) hypertension: Secondary | ICD-10-CM | POA: Insufficient documentation

## 2011-11-05 DIAGNOSIS — R404 Transient alteration of awareness: Secondary | ICD-10-CM | POA: Insufficient documentation

## 2011-11-05 DIAGNOSIS — E876 Hypokalemia: Secondary | ICD-10-CM | POA: Insufficient documentation

## 2011-11-05 HISTORY — DX: Essential (primary) hypertension: I10

## 2011-11-05 LAB — COMPREHENSIVE METABOLIC PANEL
ALT: 25 U/L (ref 0–35)
BUN: 12 mg/dL (ref 6–23)
CO2: 24 mEq/L (ref 19–32)
Calcium: 9.6 mg/dL (ref 8.4–10.5)
GFR calc Af Amer: >90 mL/min (ref >90)
GFR calc non Af Amer: >90 mL/min (ref >90)
Glucose, Bld: 84 mg/dL (ref 70–99)
Sodium: 136 mEq/L (ref 135–145)
Total Bilirubin: 0.1 mg/dL — ABNORMAL LOW (ref 0.3–1.2)
Total Protein: 8.1 g/dL (ref 6.0–8.3)

## 2011-11-05 LAB — URINALYSIS, ROUTINE W REFLEX MICROSCOPIC
Bilirubin Urine: NEGATIVE
Hgb urine dipstick: NEGATIVE
Ketones, ur: NEGATIVE mg/dL
Nitrite: NEGATIVE
Protein, ur: NEGATIVE mg/dL
Specific Gravity, Urine: 1.019 (ref 1.005–1.030)
Urobilinogen, UA: 1 mg/dL (ref 0.0–1.0)
pH: 6.5 (ref 5.0–8.0)

## 2011-11-05 LAB — DIFFERENTIAL
Eosinophils Relative: 1 % (ref 0–5)
Lymphocytes Relative: 32 % (ref 12–46)
Lymphs Abs: 3.2 10*3/uL (ref 0.7–4.0)
Monocytes Absolute: 0.7 10*3/uL (ref 0.1–1.0)

## 2011-11-05 LAB — CBC
HCT: 38.4 % (ref 36.0–46.0)
Hemoglobin: 13 g/dL (ref 12.0–15.0)
MCV: 86.9 fL (ref 78.0–100.0)
Platelets: 398 10*3/uL (ref 150–400)
RBC: 4.42 MIL/uL (ref 3.87–5.11)
WBC: 10.1 10*3/uL (ref 4.0–10.5)

## 2011-11-05 MED ORDER — POTASSIUM CHLORIDE CRYS ER 20 MEQ PO TBCR
40.0000 meq | EXTENDED_RELEASE_TABLET | Freq: Once | ORAL | Status: AC
  Administered 2011-11-05: 40 meq via ORAL
  Filled 2011-11-05: qty 2

## 2011-11-05 NOTE — ED Provider Notes (Signed)
History     CSN: [id number]  Arrival date & time 11/05/11  0055   First MD Initiated Contact with Patient 11/05/11 0117      Chief Complaint  Patient presents with  . Loss of Consciousness    (Consider location/radiation/quality/duration/timing/severity/associated sxs/prior treatment) HPI 41 yo female presents to the ER c/o syncope.  No prior h/o same.  Pt reports she drank a [employer] Hard Lemonade, and felt dizzy/off balance and ill.  She splashed water on her face and laid down briefly and felt better.  She then went to the kitchen to make food, and began to feel unwell again.  Pt reports vision went gray, and she passed out for about a minute per family.  Pt feeling "shaky" now.  H/o htn, on amlodipine.  No chest pain, palpitations, focal weakness, numbness, tingling, headache.    Past Medical History  Diagnosis Date  . Hypertension     History reviewed. No pertinent past surgical history.  No family history on file.  History  Substance Use Topics  . Smoking status: Not on file  . Smokeless tobacco: Not on file  . Alcohol Use:     OB History    Grav Para Term Preterm Abortions TAB SAB Ect Mult Living                  Review of Systems  All other systems reviewed and are negative.    Allergies  Review of patient's allergies indicates no known allergies.  Home Medications   Current Outpatient Rx  Name Route Sig Dispense Refill  . AMLODIPINE BESYLATE 10 MG PO TABS Oral Take 10 mg by mouth daily.      BP 123/85  Pulse 106  Temp(Src) 98.2 F (36.8 C) (Oral)  Resp 16  SpO2 100%  Physical Exam  Nursing note and vitals reviewed. Constitutional: She is oriented to person, place, and time. She appears well-developed and well-nourished.  HENT:  Head: Normocephalic and atraumatic.  Left Ear: External ear normal.  Nose: Nose normal.  Mouth/Throat: Oropharynx is clear and moist.       Right tm blocked by cerumen  Eyes: Conjunctivae and EOM are normal. Pupils  are equal, round, and reactive to light.  Neck: Normal range of motion. Neck supple. No JVD present. No tracheal deviation present. No thyromegaly present.  Cardiovascular: Normal rate, regular rhythm, normal heart sounds and intact distal pulses.  Exam reveals no gallop and no friction rub.   No murmur heard. Pulmonary/Chest: Effort normal and breath sounds normal. No stridor. No respiratory distress. She has no wheezes. She has no rales. She exhibits no tenderness.  Abdominal: Soft. Bowel sounds are normal. She exhibits no distension and no mass. There is no tenderness. There is no rebound and no guarding.  Musculoskeletal: Normal range of motion. She exhibits no edema and no tenderness.  Lymphadenopathy:    She has no cervical adenopathy.  Neurological: She is alert and oriented to person, place, and time. She has normal reflexes. No cranial nerve deficit. She exhibits normal muscle tone. Coordination normal.  Skin: Skin is dry. No rash noted. No erythema. No pallor.  Psychiatric: She has a normal mood and affect. Her behavior is normal. Judgment and thought content normal.    ED Course  Procedures (including critical care time)  Labs Reviewed  COMPREHENSIVE METABOLIC PANEL - Abnormal; Notable for the following:    Potassium 3.0 (*)    Total Bilirubin 0.1 (*)    All other components  within normal limits  URINALYSIS, ROUTINE W REFLEX MICROSCOPIC - Abnormal; Notable for the following:    APPearance CLOUDY (*)    All other components within normal limits  ETHANOL - Abnormal; Notable for the following:    Alcohol, Ethyl (B) 45 (*)    All other components within normal limits  CBC  DIFFERENTIAL   Ct Head Wo Contrast  11/05/2011  *RADIOLOGY REPORT*  Clinical Data: Syncope, head injury.  CT HEAD WITHOUT CONTRAST  Technique:  Contiguous axial images were obtained from the base of the skull through the vertex without contrast.  Comparison: None.  Findings: There is no evidence for acute  hemorrhage, hydrocephalus, mass lesion, or abnormal extra-axial fluid collection.  No definite CT evidence for acute infarction.  There is partial opacification of the left maxillary sinus.  The visualized paranasal sinuses and mastoid air cells are predominately clear.  IMPRESSION: No acute intracranial abnormality.  Partially opacified left maxillary sinus, may represent acute sinusitis.  Original Report Authenticated By: Waneta Martins, M.D.    Date: 11/05/2011  Rate: 95  Rhythm: normal sinus rhythm  QRS Axis: normal  Intervals: normal  ST/T Wave abnormalities: nonspecific T wave changes  Conduction Disutrbances:none  Narrative Interpretation:   Old EKG Reviewed: none available    1. Syncope   2. Hypokalemia       MDM  41 yo female with episode of syncope.  Workup unremarkable.  Pt feeling better, back to baseline, ambulating without difficulty.  Will have her f/u with pcm.        Olivia Mackie, MD 11/06/11 [phone]

## 2011-11-05 NOTE — Discharge Instructions (Signed)
Drink plenty of fluids. Followup with your doctor for recheck in 3-5 days. Return to the emergency room for worsening condition or new concerning symptoms  Hypokalemia Hypokalemia means a low potassium level in the blood.Potassium is an electrolyte that helps regulate the amount of fluid in the body. It also stimulates muscle contraction and maintains a stable acid-base balance.Most of the body's potassium is inside of cells, and only a very small amount is in the blood. Because the amount in the blood is so small, minor changes can have big effects. PREPARATION FOR TEST Testing for potassium requires taking a blood sample taken by needle from a vein in the arm. The skin is cleaned thoroughly before the sample is drawn. There is no other special preparation needed. NORMAL VALUES Potassium levels below 3.5 mEq/L are abnormally low. Levels above 5.1 mEq/L are abnormally high. Ranges for normal findings may vary among different laboratories and hospitals. You should always check with your doctor after having lab work or other tests done to discuss the meaning of your test results and whether your values are considered within normal limits. MEANING OF TEST  Your caregiver will go over the test results with you and discuss the importance and meaning of your results, as well as treatment options and the need for additional tests, if necessary. A potassium level is frequently part of a routine medical exam. It is usually included as part of a whole "panel" of tests for several blood salts (such as Sodium and Chloride). It may be done as part of follow-up when a low potassium level was found in the past or other blood salts are suspected of being out of balance. A low potassium level might be suspected if you have one or more of the following:  Symptoms of weakness.   Abnormal heart rhythms.   High blood pressure and are taking medication to control this, especially water pills (diuretics).   Kidney  disease that can affect your potassium level .   Diabetes requiring the use of insulin. The potassium may fall after taking insulin, especially if the diabetes had been out of control for a while.   A condition requiring the use of cortisone-type medication or certain types of antibiotics.   Vomiting and/or diarrhea for more than a day or two.   A stomach or intestinal condition that may not permit appropriate absorption of potassium.   Fainting episodes.   Mental confusion.  OBTAINING TEST RESULTS It is your responsibility to obtain your test results. Ask the lab or department performing the test when and how you will get your results.  Please contact your caregiver directly if you have not received the results within one week. At that time, ask if there is anything different or new you should be doing in relation to the results. TREATMENT Hypokalemia can be treated with potassium supplements taken by mouth and/or adjustments in your current medications. A diet high in potassium is also helpful. Foods with high potassium content are:  Peas, lentils, lima beans, nuts, and dried fruit.   Whole grain and bran cereals and breads.   Fresh fruit, vegetables (bananas, cantaloupe, grapefruit, oranges, tomatoes, honeydew melons, potatoes).   Orange and tomato juices.   Meats. If potassium supplement has been prescribed for you today or your medications have been adjusted, see your personal caregiver in time02 for a re-check.  SEEK MEDICAL CARE IF:  There is a feeling of worsening weakness.   You experience repeated chest palpitations.   You  are diabetic and having difficulty keeping your blood sugars in the normal range.   You are experiencing vomiting and/or diarrhea.   You are having difficulty with any of your regular medications.  SEEK IMMEDIATE MEDICAL CARE IF:  You experience chest pain, shortness of breath, or episodes of dizziness.   You have been having vomiting or  diarrhea for more than 2 days.   You have a fainting episode.  MAKE SURE YOU:   Understand these instructions.   Will watch your condition.   Will get help right away if you are not doing well or get worse.  Document Released: 06/22/2005 Document Revised: 06/11/2011 Document Reviewed: 06/02/2008 [facility] Patient Information 2012 [place], [place].  Syncope You have had a fainting (syncopal) spell. A fainting episode is a sudden, short-lived loss of consciousness. It results in complete recovery. It occurs because there has been a temporary shortage of oxygen and/or sugar (glucose) to the brain. CAUSES   Blood pressure pills and other medications that may lower blood pressure below normal. Sudden changes in posture (sudden standing).   Over-medication. Take your medications as directed.   Standing too long. This can cause blood to pool in the legs.   Seizure disorders.   Low blood sugar (hypoglycemia) of diabetes. This more commonly causes coma.   Bearing down to go to the bathroom. This can cause your blood pressure to rise suddenly. Your body compensates by making the blood pressure too low when you stop bearing down.   Hardening of the arteries where the brain temporarily does not receive enough blood.   Irregular heart beat and circulatory problems.   Fear, emotional distress, injury, sight of blood, or illness.  Your caregiver will send you home if the syncope was from non-worrisome causes (benign). Depending on your age and health, you may stay to be monitored and observed. If you return home, have someone stay with you if your caregiver feels that is desirable. It is very important to keep all follow-up referrals and appointments in order to properly manage this condition. This is a serious problem which can lead to serious illness and death if not carefully managed.  WARNING: Do not drive or operate machinery until your caregiver feels that it is safe for you to do so. SEEK  IMMEDIATE MEDICAL CARE IF:   You have another fainting episode or faint while lying or sitting down. DO NOT DRIVE YOURSELF. Call 911 if no other help is available.   You have chest pain, are feeling sick to your stomach (nausea), vomiting or abdominal pain.   You have an irregular heartbeat or one that is very fast (pulse over 120 beats per minute).   You have a loss of feeling in some part of your body or lose movement in your arms or legs.   You have difficulty with speech, confusion, severe weakness, or visual problems.   You become sweaty and/or feel light headed.  Make sure you are rechecked as instructed. Document Released: 06/22/2005 Document Revised: 06/11/2011 Document Reviewed: 02/10/2007 [facility] Patient Information 2012 [place], [place].

## 2011-11-05 NOTE — ED Notes (Signed)
JYN:[id number]<AO> Expected date:<BR> Expected time:<BR> Means of arrival:<BR> Comments:<BR> EMS/syncopal episode

## 2011-11-05 NOTE — ED Notes (Signed)
Pt states she drank a [employer] Hard Lemonade, didn't feel right, put water on her face, went to the kitchen to make herself some food, passed out for approx a min per family. Carpet burn on chin. SP02 initially was 84 but came back up to 98 on RA. CBG 112.

## 2012-03-17 ENCOUNTER — Other Ambulatory Visit (HOSPITAL_COMMUNITY): Payer: Self-pay | Admitting: Family Medicine

## 2012-03-17 DIAGNOSIS — Z1231 Encounter for screening mammogram for malignant neoplasm of breast: Secondary | ICD-10-CM

## 2012-04-05 ENCOUNTER — Ambulatory Visit (HOSPITAL_COMMUNITY): Payer: Self-pay

## 2012-04-14 ENCOUNTER — Ambulatory Visit (HOSPITAL_COMMUNITY)
Admission: RE | Admit: 2012-04-14 | Discharge: 2012-04-14 | Disposition: A | Discharge status: Home / Self Care | Payer: Self-pay | Source: Ambulatory Visit | Attending: Family Medicine | Admitting: Family Medicine

## 2012-04-14 DIAGNOSIS — Z1231 Encounter for screening mammogram for malignant neoplasm of breast: Secondary | ICD-10-CM

## 2014-08-14 ENCOUNTER — Emergency Department (INDEPENDENT_AMBULATORY_CARE_PROVIDER_SITE_OTHER)
Admission: EM | Admit: 2014-08-14 | Discharge: 2014-08-14 | Disposition: A | Discharge status: Home / Self Care | Payer: BC Managed Care – PPO | Source: Home / Self Care | Attending: Family Medicine | Admitting: Family Medicine

## 2014-08-14 ENCOUNTER — Encounter (HOSPITAL_COMMUNITY): Payer: Self-pay | Admitting: *Deleted

## 2014-08-14 DIAGNOSIS — I10 Essential (primary) hypertension: Secondary | ICD-10-CM

## 2014-08-14 DIAGNOSIS — J069 Acute upper respiratory infection, unspecified: Secondary | ICD-10-CM

## 2014-08-14 MED ORDER — AMLODIPINE BESYLATE 10 MG PO TABS: 10.0000 mg | ORAL_TABLET | Freq: Every day | ORAL | Status: DC

## 2014-08-14 MED ORDER — DEXTROMETHORPHAN POLISTIREX 30 MG/5ML PO LQCR: 30.0000 mg | Freq: Two times a day (BID) | ORAL | Status: DC

## 2014-08-14 MED ORDER — IPRATROPIUM BROMIDE 0.06 % NA SOLN: 2.0000 | Freq: Four times a day (QID) | NASAL | Status: DC

## 2014-08-14 NOTE — ED Notes (Signed)
Pt      Reports     Symptoms  Of  Cough    Congestion        Sinus  Drainage           With   Symptoms  X  6  Days            Bp  Is  Elevated  As   Well     Has  Not  Taken  meds    Lately     She  Reports  Has  Been   Taking    OTC    meds    At  [facility]

## 2014-08-14 NOTE — Discharge Instructions (Signed)
No more salt, drink plenty of water, see your doctor for blood pressure recheck.

## 2014-08-14 NOTE — ED Provider Notes (Signed)
CSN: [id number]     Arrival date & time 08/14/14  1213 History   First MD Initiated Contact with Patient 08/14/14 1319     Chief Complaint  Patient presents with  . URI   (Consider location/radiation/quality/duration/timing/severity/associated sxs/prior Treatment) Patient is a 44 y.o. female presenting with URI. The history is provided by the patient.  URI Presenting symptoms: congestion, cough and rhinorrhea   Presenting symptoms: no fever and no sore throat   Severity:  Mild Onset quality:  Gradual Duration:  6 days Progression:  Unchanged Chronicity:  New Ineffective treatments:  Decongestant Associated symptoms: no headaches and no myalgias     Past Medical History  Diagnosis Date  . Hypertension    History reviewed. No pertinent past surgical history. History reviewed. No pertinent family history. History  Substance Use Topics  . Smoking status: Never Smoker   . Smokeless tobacco: Not on file  . Alcohol Use: Yes   OB History    No data available     Review of Systems  Constitutional: Negative.  Negative for fever and chills.  HENT: Positive for congestion, postnasal drip and rhinorrhea. Negative for sore throat.   Respiratory: Positive for cough.   Cardiovascular: Negative.   Gastrointestinal: Negative.   Genitourinary: Negative.   Musculoskeletal: Negative for myalgias.  Neurological: Negative for headaches.    Allergies  Review of patient's allergies indicates no known allergies.  Home Medications   Prior to Admission medications   Medication Sig Start Date End Date Taking? Authorizing Provider  amLODipine (NORVASC) 10 MG tablet Take 1 tablet (10 mg total) by mouth daily. 08/14/14   Linna HoffJames D Willadean Guyton, MD  dextromethorphan (DELSYM) 30 MG/5ML liquid Take 5 mLs (30 mg total) by mouth 2 (two) times daily. 08/14/14   Linna HoffJames D Diavian Furgason, MD  ipratropium (ATROVENT) 0.06 % nasal spray Place 2 sprays into the nose 4 (four) times daily. 08/14/14   Linna HoffJames D Ameri Cahoon, MD   BP  206/120 mmHg  Pulse 72  Temp(Src) 98.6 F (37 C) (Oral)  Resp 18  SpO2 100% Physical Exam  Constitutional: She is oriented to person, place, and time. She appears well-developed and well-nourished.  HENT:  Head: Normocephalic.  Right Ear: External ear normal.  Left Ear: External ear normal.  Mouth/Throat: Oropharynx is clear and moist.  Eyes: Conjunctivae are normal. Pupils are equal, round, and reactive to light.  Neck: Normal range of motion. Neck supple.  Cardiovascular: Regular rhythm, normal heart sounds and intact distal pulses.   Pulmonary/Chest: Effort normal and breath sounds normal.  Musculoskeletal: She exhibits no edema.  Lymphadenopathy:    She has no cervical adenopathy.  Neurological: She is alert and oriented to person, place, and time.  Skin: Skin is warm and dry.  Nursing note and vitals reviewed.   ED Course  Procedures (including critical care time) Labs Review Labs Reviewed - No data to display  Imaging Review No results found.   MDM   1. URI (upper respiratory infection)   2. Hypertension, essential, benign        Linna HoffJames D Keoni Risinger, MD 08/14/14 [phone]

## 2014-09-09 ENCOUNTER — Emergency Department (HOSPITAL_COMMUNITY)
Admission: EM | Admit: 2014-09-09 | Discharge: 2014-09-09 | Disposition: A | Discharge status: Home / Self Care | Payer: BC Managed Care – PPO | Attending: Emergency Medicine | Admitting: Emergency Medicine

## 2014-09-09 ENCOUNTER — Encounter (HOSPITAL_COMMUNITY): Payer: Self-pay | Admitting: *Deleted

## 2014-09-09 ENCOUNTER — Emergency Department (HOSPITAL_COMMUNITY): Payer: BC Managed Care – PPO

## 2014-09-09 DIAGNOSIS — R079 Chest pain, unspecified: Secondary | ICD-10-CM

## 2014-09-09 DIAGNOSIS — Z79899 Other long term (current) drug therapy: Secondary | ICD-10-CM | POA: Insufficient documentation

## 2014-09-09 DIAGNOSIS — R05 Cough: Secondary | ICD-10-CM | POA: Insufficient documentation

## 2014-09-09 DIAGNOSIS — M79602 Pain in left arm: Secondary | ICD-10-CM | POA: Insufficient documentation

## 2014-09-09 DIAGNOSIS — R0789 Other chest pain: Secondary | ICD-10-CM | POA: Insufficient documentation

## 2014-09-09 DIAGNOSIS — R000 Tachycardia, unspecified: Secondary | ICD-10-CM | POA: Insufficient documentation

## 2014-09-09 DIAGNOSIS — R0981 Nasal congestion: Secondary | ICD-10-CM | POA: Insufficient documentation

## 2014-09-09 DIAGNOSIS — I10 Essential (primary) hypertension: Secondary | ICD-10-CM | POA: Diagnosis not present

## 2014-09-09 LAB — CBC WITH DIFFERENTIAL/PLATELET
BASOS PCT: 0 % (ref 0–1)
Basophils Absolute: 0 10*3/uL (ref 0.0–0.1)
EOS ABS: 0 10*3/uL (ref 0.0–0.7)
EOS PCT: 0 % (ref 0–5)
HEMATOCRIT: 41.5 % (ref 36.0–46.0)
Hemoglobin: 13.6 g/dL (ref 12.0–15.0)
LYMPHS ABS: 2.4 10*3/uL (ref 0.7–4.0)
LYMPHS PCT: 16 % (ref 12–46)
MCH: 29.7 pg (ref 26.0–34.0)
MCHC: 32.8 g/dL (ref 30.0–36.0)
MCV: 90.6 fL (ref 78.0–100.0)
MONOS PCT: 6 % (ref 3–12)
Monocytes Absolute: 0.8 10*3/uL (ref 0.1–1.0)
NEUTROS PCT: 78 % — AB (ref 43–77)
Neutro Abs: 12.2 10*3/uL — ABNORMAL HIGH (ref 1.7–7.7)
Platelets: 413 10*3/uL — ABNORMAL HIGH (ref 150–400)
RBC: 4.58 MIL/uL (ref 3.87–5.11)
RDW: 15 % (ref 11.5–15.5)
WBC: 15.4 10*3/uL — ABNORMAL HIGH (ref 4.0–10.5)

## 2014-09-09 LAB — COMPREHENSIVE METABOLIC PANEL
ALBUMIN: 3.8 g/dL (ref 3.5–5.2)
ALT: 25 U/L (ref 0–35)
AST: 19 U/L (ref 0–37)
Alkaline Phosphatase: 77 U/L (ref 39–117)
Anion gap: 6 (ref 5–15)
BILIRUBIN TOTAL: 0.5 mg/dL (ref 0.3–1.2)
BUN: 11 mg/dL (ref 6–23)
CO2: 29 mmol/L (ref 19–32)
Calcium: 8.8 mg/dL (ref 8.4–10.5)
Chloride: 103 mmol/L (ref 96–112)
Creatinine, Ser: 0.6 mg/dL (ref 0.50–1.10)
GFR calc Af Amer: >90 mL/min (ref >90)
GFR calc non Af Amer: >90 mL/min (ref >90)
GLUCOSE: 90 mg/dL (ref 70–99)
POTASSIUM: 3.3 mmol/L — AB (ref 3.5–5.1)
Sodium: 138 mmol/L (ref 135–145)
Total Protein: 7.2 g/dL (ref 6.0–8.3)

## 2014-09-09 LAB — I-STAT TROPONIN, ED
TROPONIN I, POC: 0 ng/mL (ref 0.00–0.08)
Troponin i, poc: 0 ng/mL (ref 0.00–0.08)

## 2014-09-09 LAB — D-DIMER, QUANTITATIVE: D-Dimer, Quant: 0.44 ug/mL-FEU (ref 0.00–0.48)

## 2014-09-09 MED ORDER — ONDANSETRON 4 MG PO TBDP
8.0000 mg | ORAL_TABLET | Freq: Once | ORAL | Status: AC
  Administered 2014-09-09: 8 mg via ORAL
  Filled 2014-09-09: qty 2

## 2014-09-09 MED ORDER — OXYCODONE-ACETAMINOPHEN 5-325 MG PO TABS
2.0000 | ORAL_TABLET | Freq: Once | ORAL | Status: AC
  Administered 2014-09-09: 2 via ORAL
  Filled 2014-09-09: qty 2

## 2014-09-09 MED ORDER — ASPIRIN 81 MG PO CHEW
324.0000 mg | CHEWABLE_TABLET | Freq: Once | ORAL | Status: AC
  Administered 2014-09-09: 324 mg via ORAL
  Filled 2014-09-09: qty 4

## 2014-09-09 NOTE — ED Notes (Signed)
Pt reports recent cold symptoms, having productive cough. Pt began having left shoulder pain last night, radiates down her left arm and also has mid chest tightness and palpitations.

## 2014-09-09 NOTE — Discharge Instructions (Signed)

## 2014-09-09 NOTE — ED Provider Notes (Signed)
CSN: [id number]     Arrival date & time 09/09/14  4098 History   First MD Initiated Contact with Patient 09/09/14 1028     Chief Complaint  Patient presents with  . Arm Pain  . Chest Pain     (Consider location/radiation/quality/duration/timing/severity/associated sxs/prior Treatment) HPI  44 year old female who comes in today complaining of left chest tightness. She states that this began yesterday. She has been treated for upper respiratory infection over the past 2 and half weeks. She was not improving so her primary care physician on Wednesday which time she was started on an antibiotic and prednisone. She states last night she had some tightness in the left anterior chest radiating down her arm. It was present all night. She had an episode attendance was not present this morning and then returned. She has continued to have some coughing but it is nonproductive. Was productive previously. She has a history of hypertension and is taking antihypertensive medications. She denies any headache, neck stiffness, history of lung disease, nausea, vomiting, or diarrhea. She has had a hysterectomy.  Past Medical History  Diagnosis Date  . Hypertension    Past Surgical History  Procedure Laterality Date  . Abdominal hysterectomy     History reviewed. No pertinent family history. History  Substance Use Topics  . Smoking status: Never Smoker   . Smokeless tobacco: Not on file  . Alcohol Use: Yes   OB History    No data available     Review of Systems  Constitutional: Negative.   HENT: Positive for congestion.   Respiratory: Positive for cough and chest tightness.   Cardiovascular: Positive for chest pain. Negative for palpitations and leg swelling.  Gastrointestinal: Negative for abdominal pain and blood in stool.  Endocrine: Negative.   Genitourinary: Negative.   Musculoskeletal: Negative.   Allergic/Immunologic: Negative.   Neurological: Negative.   Hematological: Negative.     Psychiatric/Behavioral: Negative.       Allergies  Review of patient's allergies indicates no known allergies.  Home Medications   Prior to Admission medications   Medication Sig Start Date End Date Taking? Authorizing Provider  amLODipine (NORVASC) 10 MG tablet Take 1 tablet (10 mg total) by mouth daily. 08/14/14   Linna Hoff, MD  dextromethorphan (DELSYM) 30 MG/5ML liquid Take 5 mLs (30 mg total) by mouth 2 (two) times daily. 08/14/14   Linna Hoff, MD  ipratropium (ATROVENT) 0.06 % nasal spray Place 2 sprays into the nose 4 (four) times daily. 08/14/14   Linna Hoff, MD   BP 134/93 mmHg  Pulse 106  Temp(Src) 98.3 F (36.8 C) (Oral)  Resp 21  SpO2 100% Physical Exam  Constitutional: She is oriented to person, place, and time. She appears well-developed and well-nourished.  HENT:  Head: Normocephalic and atraumatic.  Right Ear: External ear normal.  Left Ear: External ear normal.  Nose: Nose normal.  Mouth/Throat: Oropharynx is clear and moist.  Eyes: Conjunctivae and EOM are normal. Pupils are equal, round, and reactive to light.  Neck: Normal range of motion. Neck supple.  Cardiovascular: Regular rhythm, normal heart sounds and intact distal pulses.  Tachycardia present.   Pulmonary/Chest: Effort normal and breath sounds normal.    Abdominal: Soft. Bowel sounds are normal.  Musculoskeletal: Normal range of motion.  Neurological: She is alert and oriented to person, place, and time. She has normal reflexes.  Skin: Skin is warm and dry.  Psychiatric: She has a normal mood and affect. Her behavior is  normal. Judgment and thought content normal.  Nursing note and vitals reviewed.   ED Course  Procedures (including critical care time) Labs Review Labs Reviewed  CBC WITH DIFFERENTIAL/PLATELET  COMPREHENSIVE METABOLIC PANEL  D-DIMER, QUANTITATIVE  I-STAT TROPOININ, ED    Imaging Review No results found.   EKG Interpretation   Date/Time:  Sunday September 09 2014  09:51:41 EST Ventricular Rate:  114 PR Interval:  152 QRS Duration: 80 QT Interval:  346 QTC Calculation: 476 R Axis:   55 Text Interpretation:  Sinus tachycardia Biatrial enlargement Cannot rule  out Anteroseptal infarct , age undetermined Abnormal ECG rate has  increased 19 b/m since last tracing of 05 Nov 2011 Confirmed by Maylin Freeburg MD,  Duwayne HeckANIELLE [phone]([id number]) on 09/09/2014 10:29:13 AM      MDM   Final diagnoses:  Chest pain    44 year old female smoker with recent URI symptoms present today with left-sided chest pain that this does not appear consistent with coronary artery disease. EKG here shows sinus tachycardia without any acute ischemic changes. Troponins were obtained 2 and are normal. Patient had chest x-Casey Maxfield that does not show any evidence of acute infiltrate. Pain is  reproducible.    Hilario Quarryanielle S Braelynne Garinger, MD 09/09/14 1556

## 2015-03-26 ENCOUNTER — Emergency Department (INDEPENDENT_AMBULATORY_CARE_PROVIDER_SITE_OTHER)
Admission: EM | Admit: 2015-03-26 | Discharge: 2015-03-26 | Disposition: A | Discharge status: Home / Self Care | Payer: BC Managed Care – PPO | Source: Home / Self Care | Attending: Emergency Medicine | Admitting: Emergency Medicine

## 2015-03-26 ENCOUNTER — Encounter (HOSPITAL_COMMUNITY): Payer: Self-pay | Admitting: Emergency Medicine

## 2015-03-26 DIAGNOSIS — J40 Bronchitis, not specified as acute or chronic: Secondary | ICD-10-CM | POA: Diagnosis not present

## 2015-03-26 DIAGNOSIS — I10 Essential (primary) hypertension: Secondary | ICD-10-CM

## 2015-03-26 MED ORDER — BENZONATATE 200 MG PO CAPS: 200.0000 mg | ORAL_CAPSULE | Freq: Two times a day (BID) | ORAL | Status: DC | PRN

## 2015-03-26 MED ORDER — AMLODIPINE BESYLATE 10 MG PO TABS: 10.0000 mg | ORAL_TABLET | Freq: Every day | ORAL | Status: DC

## 2015-03-26 MED ORDER — PREDNISONE 50 MG PO TABS: ORAL_TABLET | ORAL | Status: DC

## 2015-03-26 NOTE — Discharge Instructions (Signed)
You likely have a viral upper respiratory infection. I am concerned it is starting to move into your airways. Please take prednisone daily for 5 days. Use the Tessalon twice a day as needed for cough. Get over-the-counter Mucinex and take that twice a day. Please do not get any medications that have decongestants in them. I have provided a prescription for your amlodipine with several refills. Follow-up as needed.

## 2015-03-26 NOTE — ED Notes (Signed)
Pt c/o cold sx onset 4 days Sx incude cough, congestion, ST, HA and SOB  Also reports she has been out of her BP medication, amlodipine, x6 months BP today is 203/120... Denies CP Alert and oriented x4... No acute distress.

## 2015-03-26 NOTE — ED Provider Notes (Signed)
CSN: [id number]     Arrival date & time 03/26/15  1310 History   First MD Initiated Contact with Patient 03/26/15 1422     Chief Complaint  Patient presents with  . URI  . Hypertension   (Consider location/radiation/quality/duration/timing/severity/associated sxs/prior Treatment) HPI  She is a 44 year old woman here for evaluation of cough. She states her symptoms started 4 days ago with nasal congestion and a scratchy throat. Her symptoms gradually got worse over the last 4 days. She now describes nasal congestion, scratchy throat, headaches, cough, and wheeze. She denies any shortness of breath or fevers. No nausea or vomiting. No ear pain. She has taken several different over-the-counter medications, including decongestant, without improvement.  She also has a history of hypertension. She has not had her amlodipine in several months. She denies any chest pain, shortness of breath, vision changes. She does report some intermittent swelling in her feet, but states that this is overall improved from prior.  Past Medical History  Diagnosis Date  . Hypertension    Past Surgical History  Procedure Laterality Date  . Abdominal hysterectomy     No family history on file. Social History  Substance Use Topics  . Smoking status: Never Smoker   . Smokeless tobacco: None  . Alcohol Use: Yes   OB History    No data available     Review of Systems As in history of present illness Allergies  Review of patient's allergies indicates no known allergies.  Home Medications   Prior to Admission medications   Medication Sig Start Date End Date Taking? Authorizing Provider  amLODipine (NORVASC) 10 MG tablet Take 1 tablet (10 mg total) by mouth daily. 03/26/15   Charm Rings, MD  benzonatate (TESSALON) 200 MG capsule Take 1 capsule (200 mg total) by mouth 2 (two) times daily as needed for cough. 03/26/15   Charm Rings, MD  predniSONE (DELTASONE) 50 MG tablet Take 1 pill daily for 5 days. 03/26/15    Charm Rings, MD   Meds Ordered and Administered this Visit  Medications - No data to display  BP 203/120 mmHg  Pulse 88  Temp(Src) 98.2 F (36.8 C) (Oral)  Resp 16  SpO2 98% No data found.   Physical Exam  Constitutional: She is oriented to person, place, and time. She appears well-developed and well-nourished. No distress.  HENT:  Mouth/Throat: No oropharyngeal exudate.  Clear nasal discharge seen. Nasal term and slightly erythematous. Mild cobblestoning present.  Neck: Neck supple.  Cardiovascular: Normal rate, regular rhythm and normal heart sounds.   No murmur heard. Pulmonary/Chest: Effort normal and breath sounds normal. No respiratory distress. She has no wheezes. She has no rales.  Lymphadenopathy:    She has no cervical adenopathy.  Neurological: She is alert and oriented to person, place, and time.    ED Course  Procedures (including critical care time)  Labs Review Labs Reviewed - No data to display  Imaging Review No results found.    MDM   1. Bronchitis   2. Essential hypertension    I have provided a refill of her amlodipine with several refills as she is in the process of changing her doctor. We'll treat early bronchitis with prednisone and Tessalon. Follow-up as needed.    Charm Rings, MD 03/26/15 1452

## 2015-03-27 ENCOUNTER — Emergency Department (HOSPITAL_COMMUNITY)
Admission: EM | Admit: 2015-03-27 | Discharge: 2015-03-27 | Disposition: A | Discharge status: Home / Self Care | Payer: BC Managed Care – PPO | Attending: Emergency Medicine | Admitting: Emergency Medicine

## 2015-03-27 ENCOUNTER — Encounter (HOSPITAL_COMMUNITY): Payer: Self-pay | Admitting: Emergency Medicine

## 2015-03-27 DIAGNOSIS — G4489 Other headache syndrome: Secondary | ICD-10-CM | POA: Diagnosis not present

## 2015-03-27 DIAGNOSIS — I10 Essential (primary) hypertension: Secondary | ICD-10-CM | POA: Diagnosis not present

## 2015-03-27 DIAGNOSIS — R51 Headache: Secondary | ICD-10-CM | POA: Diagnosis present

## 2015-03-27 DIAGNOSIS — R05 Cough: Secondary | ICD-10-CM | POA: Insufficient documentation

## 2015-03-27 MED ORDER — DIPHENHYDRAMINE HCL 50 MG/ML IJ SOLN
25.0000 mg | Freq: Once | INTRAMUSCULAR | Status: AC
  Administered 2015-03-27: 25 mg via INTRAVENOUS
  Filled 2015-03-27: qty 1

## 2015-03-27 MED ORDER — KETOROLAC TROMETHAMINE 30 MG/ML IJ SOLN
30.0000 mg | Freq: Once | INTRAMUSCULAR | Status: AC
  Administered 2015-03-27: 30 mg via INTRAVENOUS
  Filled 2015-03-27: qty 1

## 2015-03-27 MED ORDER — METOCLOPRAMIDE HCL 5 MG/ML IJ SOLN
10.0000 mg | Freq: Once | INTRAMUSCULAR | Status: AC
  Administered 2015-03-27: 10 mg via INTRAVENOUS
  Filled 2015-03-27: qty 2

## 2015-03-27 NOTE — ED Provider Notes (Signed)
CSN: [id number]     Arrival date & time 03/27/15  0325 History   First MD Initiated Contact with Patient 03/27/15 0531     Chief Complaint  Patient presents with  . Headache    Patient is a 44 y.o. female presenting with headaches. The history is provided by the patient.  Headache Pain location:  Generalized Quality:  Dull Onset quality:  Gradual Duration:  1 week Timing:  Intermittent Progression:  Worsening Chronicity:  New Relieved by:  Nothing Exacerbated by: OTC meds. Associated symptoms: cough   Associated symptoms: no fever, no vomiting and no weakness   Pt presents for HA   She reports recent cough/congestion.  She has been taking OTC meds that likely caused her BP to elevate She reports gradual onset of HA She reports she has these headaches frequently but this episode lasted longer No fever/vomiting No CP She reports mild cough/sob No focal weakness She reports she has not been compliant with her BP meds but just got her prescriptions refilled She denies h/o CVA   Past Medical History  Diagnosis Date  . Hypertension    Past Surgical History  Procedure Laterality Date  . Abdominal hysterectomy     No family history on file. Social History  Substance Use Topics  . Smoking status: Never Smoker   . Smokeless tobacco: None  . Alcohol Use: Yes   OB History    No data available     Review of Systems  Constitutional: Negative for fever.  Respiratory: Positive for cough.   Cardiovascular: Negative for chest pain.  Gastrointestinal: Negative for vomiting.  Neurological: Positive for headaches. Negative for weakness.  All other systems reviewed and are negative.     Allergies  Review of patient's allergies indicates no known allergies.  Home Medications   Prior to Admission medications   Medication Sig Start Date End Date Taking? Authorizing Provider  amLODipine (NORVASC) 10 MG tablet Take 1 tablet (10 mg total) by mouth daily. 03/26/15   Charm Rings, MD  benzonatate (TESSALON) 200 MG capsule Take 1 capsule (200 mg total) by mouth 2 (two) times daily as needed for cough. 03/26/15   Charm Rings, MD  predniSONE (DELTASONE) 50 MG tablet Take 1 pill daily for 5 days. 03/26/15   Charm Rings, MD   BP 149/93 mmHg  Pulse 87  Temp(Src) 98.3 F (36.8 C) (Oral)  Resp 16  Ht 5' 3.5" (1.613 m)  Wt 143 lb (64.864 kg)  BMI 24.93 kg/m2  SpO2 95% Physical Exam CONSTITUTIONAL: Well developed/well nourished HEAD: Normocephalic/atraumatic EYES: EOMI/PERRL, no nystagmus, no ptosis ENMT: Mucous membranes moist NECK: supple no meningeal signs, no bruits SPINE/BACK:entire spine nontender CV: S1/S2 noted, no murmurs/rubs/gallops noted LUNGS: Lungs are clear to auscultation bilaterally, no apparent distress ABDOMEN: soft, nontender, no rebound or guarding GU:no cva tenderness NEURO:Awake/alert, facies symmetric, no arm or leg drift is noted Equal 5/5 strength with shoulder abduction, elbow flex/extension, wrist flex/extension in upper extremities and equal hand grips bilaterally Equal 5/5 strength with hip flexion,knee flex/extension, foot dorsi/plantar flexion Cranial nerves 3/4/5/6/01/11/09/11/12 tested and intact Gait normal without ataxia No past pointing Sensation to light touch intact in all extremities EXTREMITIES: pulses normal, full ROM SKIN: warm, color normal PSYCH: no abnormalities of mood noted, alert and oriented to situation   ED Course  Procedures   Medications  metoCLOPramide (REGLAN) injection 10 mg (10 mg Intravenous Given 03/27/15 0603)  diphenhydrAMINE (BENADRYL) injection 25 mg (25 mg Intravenous Given 03/27/15  1610)  ketorolac (TORADOL) 30 MG/ML injection 30 mg (30 mg Intravenous Given 03/27/15 0610)    Pt well appearing She is feeling improved She reports long h/o headaches but this episode was longer No signs of meningitis She has no neuro deficits I doubt SAH or other acute neurologic emergency Will d/c  home Pt is comfortable with this plan We discussed strict ER return precautions  MDM   Final diagnoses:  Other headache syndrome  Essential hypertension   Nursing notes including past medical history and social history reviewed and considered in documentation   Zadie Rhine, MD 03/27/15 [phone]

## 2015-03-27 NOTE — Discharge Instructions (Signed)

## 2015-03-27 NOTE — ED Notes (Signed)
Pt. Reports headache with nausea and photophobia onset last week , hypertensive at triage , pt. stated she has not taken he antihypertensive  medication for 3 months .

## 2015-03-27 NOTE — ED Notes (Signed)
Pt reports cold symptoms since 9/14.

## 2015-06-04 ENCOUNTER — Ambulatory Visit: Payer: BC Managed Care – PPO | Admitting: Internal Medicine

## 2015-06-04 DIAGNOSIS — Z0289 Encounter for other administrative examinations: Secondary | ICD-10-CM

## 2015-08-16 ENCOUNTER — Encounter (HOSPITAL_COMMUNITY): Payer: Self-pay | Admitting: *Deleted

## 2015-08-16 ENCOUNTER — Emergency Department (HOSPITAL_COMMUNITY)
Admission: EM | Admit: 2015-08-16 | Discharge: 2015-08-16 | Disposition: A | Discharge status: Home / Self Care | Payer: BC Managed Care – PPO | Attending: Emergency Medicine | Admitting: Emergency Medicine

## 2015-08-16 ENCOUNTER — Emergency Department (HOSPITAL_COMMUNITY): Payer: BC Managed Care – PPO

## 2015-08-16 DIAGNOSIS — I158 Other secondary hypertension: Secondary | ICD-10-CM | POA: Diagnosis not present

## 2015-08-16 DIAGNOSIS — Z79899 Other long term (current) drug therapy: Secondary | ICD-10-CM | POA: Diagnosis not present

## 2015-08-16 DIAGNOSIS — B349 Viral infection, unspecified: Secondary | ICD-10-CM

## 2015-08-16 DIAGNOSIS — M546 Pain in thoracic spine: Secondary | ICD-10-CM | POA: Insufficient documentation

## 2015-08-16 DIAGNOSIS — R05 Cough: Secondary | ICD-10-CM | POA: Diagnosis present

## 2015-08-16 LAB — CBC
HCT: 43.7 % (ref 36.0–46.0)
Hemoglobin: 14.1 g/dL (ref 12.0–15.0)
MCH: 30.9 pg (ref 26.0–34.0)
MCHC: 32.3 g/dL (ref 30.0–36.0)
MCV: 95.6 fL (ref 78.0–100.0)
PLATELETS: 366 10*3/uL (ref 150–400)
RBC: 4.57 MIL/uL (ref 3.87–5.11)
RDW: 15.3 % (ref 11.5–15.5)
WBC: 7.7 10*3/uL (ref 4.0–10.5)

## 2015-08-16 LAB — I-STAT TROPONIN, ED: Troponin i, poc: 0.02 ng/mL (ref 0.00–0.08)

## 2015-08-16 LAB — BASIC METABOLIC PANEL
Anion gap: 13 (ref 5–15)
BUN: 7 mg/dL (ref 6–20)
CO2: 28 mmol/L (ref 22–32)
Calcium: 9.4 mg/dL (ref 8.9–10.3)
Chloride: 101 mmol/L (ref 101–111)
Creatinine, Ser: 0.69 mg/dL (ref 0.44–1.00)
GFR calc Af Amer: >60 mL/min (ref >60)
GFR calc non Af Amer: >60 mL/min (ref >60)
Glucose, Bld: 94 mg/dL (ref 65–99)
Potassium: 3.4 mmol/L — ABNORMAL LOW (ref 3.5–5.1)
SODIUM: 142 mmol/L (ref 135–145)

## 2015-08-16 MED ORDER — METHOCARBAMOL 750 MG PO TABS: 750.0000 mg | ORAL_TABLET | Freq: Four times a day (QID) | ORAL | Status: DC

## 2015-08-16 NOTE — ED Notes (Signed)
Pt says she originally went to Urgent Care for back pain "like a knife being twisted in my back." Sts it started yesterday, went away and has returned, worse in the last hour.

## 2015-08-16 NOTE — ED Notes (Signed)
Pt has been off BP meds since Monday because she has been sick with the flu and has not felt like taking her meds. Went to urgent care this am, they were concerned for "ekg changes" which ems did not see any abnormalities. EKG will be obtained to confirm this.

## 2015-08-16 NOTE — ED Provider Notes (Signed)
CSN: [id number]     Arrival date & time 08/16/15  1224 History   First MD Initiated Contact with Patient 08/16/15 1537     Chief Complaint  Patient presents with  . Hypertension     (Consider location/radiation/quality/duration/timing/severity/associated sxs/prior Treatment) HPI Comments: Pt here c/o flu sx x several days--sx consist of low grade to 100.4, emesis, and myalgias--some cough and runny nose  Emesis has improved and pt using OTC meds for other sx Developed pin point left scapula back pain x 2 days that's  sharp and worse with movement and better w/ rest--no assoc. Angina or sob Went to urgent care center and was mildly hypertensive and ecg was abnl--pt recently non-compliant w/ her htn meds No tx given at urgent care  Pt sent here for eval  Patient is a 45 y.o. female presenting with hypertension. The history is provided by the patient.  Hypertension    Past Medical History  Diagnosis Date  . Hypertension    Past Surgical History  Procedure Laterality Date  . Abdominal hysterectomy     No family history on file. Social History  Substance Use Topics  . Smoking status: Never Smoker   . Smokeless tobacco: None  . Alcohol Use: Yes   OB History    No data available     Review of Systems  All other systems reviewed and are negative.     Allergies  Review of patient's allergies indicates no known allergies.  Home Medications   Prior to Admission medications   Medication Sig Start Date End Date Taking? Authorizing Provider  amLODipine (NORVASC) 10 MG tablet Take 1 tablet (10 mg total) by mouth daily. 03/26/15  Yes Charm Rings, MD  benzonatate (TESSALON) 200 MG capsule Take 1 capsule (200 mg total) by mouth 2 (two) times daily as needed for cough. 03/26/15  Yes Charm Rings, MD  ibuprofen (ADVIL,MOTRIN) 200 MG tablet Take 800 mg by mouth 2 (two) times daily as needed for headache.   Yes Historical Provider, MD  OVER THE COUNTER MEDICATION Take 2 tablets by  mouth 2 (two) times daily as needed (cold symptoms). Cold and allergy otc equate brand   Yes Historical Provider, MD   BP 172/109 mmHg  Pulse 87  Temp(Src) 98.6 F (37 C) (Oral)  Resp 18  SpO2 100% Physical Exam  Constitutional: She is oriented to person, place, and time. She appears well-developed and well-nourished.  Non-toxic appearance. No distress.  HENT:  Head: Normocephalic and atraumatic.  Eyes: Conjunctivae, EOM and lids are normal. Pupils are equal, round, and reactive to light.  Neck: Normal range of motion. Neck supple. No tracheal deviation present. No thyroid mass present.  Cardiovascular: Normal rate, regular rhythm and normal heart sounds.  Exam reveals no gallop.   No murmur heard. Pulmonary/Chest: Effort normal and breath sounds normal. No stridor. No respiratory distress. She has no decreased breath sounds. She has no wheezes. She has no rhonchi. She has no rales.  Abdominal: Soft. Normal appearance and bowel sounds are normal. She exhibits no distension. There is no tenderness. There is no rebound and no CVA tenderness.  Musculoskeletal: Normal range of motion. She exhibits no edema or tenderness.       Back:  Neurological: She is alert and oriented to person, place, and time. She has normal strength. No cranial nerve deficit or sensory deficit. GCS eye subscore is 4. GCS verbal subscore is 5. GCS motor subscore is 6.  Skin: Skin is warm and  dry. No abrasion and no rash noted.  Psychiatric: She has a normal mood and affect. Her speech is normal and behavior is normal.  Nursing note and vitals reviewed.   ED Course  Procedures (including critical care time) Labs Review Labs Reviewed  BASIC METABOLIC PANEL - Abnormal; Notable for the following:    Potassium 3.4 (*)    All other components within normal limits  CBC  I-STAT TROPOININ, ED    Imaging Review Dg Chest 2 View  08/16/2015  CLINICAL DATA:  45 year old female with right sided back and scapula pain for  2 days. Questionable EKG changes. EXAM: CHEST  2 VIEW COMPARISON:  09/09/2014 and 09/02/2010 FINDINGS: The cardiomediastinal silhouette is unremarkable. There is no evidence of focal airspace disease, pulmonary edema, suspicious pulmonary nodule/mass, pleural effusion, or pneumothorax. No acute bony abnormalities are identified. IMPRESSION: No active cardiopulmonary disease. Electronically Signed   By: Harmon Pier M.D.   On: 08/16/2015 13:39   I have personally reviewed and evaluated these images and lab results as part of my medical decision-making.   EKG Interpretation   Date/Time:  Friday August 16 2015 12:39:39 EST Ventricular Rate:  92 PR Interval:  175 QRS Duration: 91 QT Interval:  371 QTC Calculation: 459 R Axis:   27 Text Interpretation:  Sinus rhythm Left atrial enlargement Anterior  infarct, old No significant change since last tracing Confirmed by Camilo Mander   MD, Fallon Howerter ([id number]) on 08/16/2015 3:38:20 PM      MDM   Final diagnoses:  None    pts ecg w/o acute changes when compared to prior Current sx likely from viral illness No concern for acs/pe htn noted and pt hasn't taken her htn meds x 3 days--she was encouraged to take her meds as directed Stable for d/c    Lorre Nick, MD 08/16/15 1607

## 2015-08-16 NOTE — Discharge Instructions (Signed)
Hypertension Hypertension, commonly called high blood pressure, is when the force of blood pumping through your arteries is too strong. Your arteries are the blood vessels that carry blood from your heart throughout your body. A blood pressure reading consists of a higher number over a lower number, such as 110/72. The higher number (systolic) is the pressure inside your arteries when your heart pumps. The lower number (diastolic) is the pressure inside your arteries when your heart relaxes. Ideally you want your blood pressure below 120/80. Hypertension forces your heart to work harder to pump blood. Your arteries may become narrow or stiff. Having untreated or uncontrolled hypertension can cause heart attack, stroke, kidney disease, and other problems. RISK FACTORS Some risk factors for high blood pressure are controllable. Others are not.  Risk factors you cannot control include:   Race. You may be at higher risk if you are African American.  Age. Risk increases with age.  Gender. Men are at higher risk than women before age 52 years. After age 51, women are at higher risk than men. Risk factors you can control include:  Not getting enough exercise or physical activity.  Being overweight.  Getting too much fat, sugar, calories, or salt in your diet.  Drinking too much alcohol. SIGNS AND SYMPTOMS Hypertension does not usually cause signs or symptoms. Extremely high blood pressure (hypertensive crisis) may cause headache, anxiety, shortness of breath, and nosebleed. DIAGNOSIS To check if you have hypertension, your health care provider will measure your blood pressure while you are seated, with your arm held at the level of your heart. It should be measured at least twice using the same arm. Certain conditions can cause a difference in blood pressure between your right and left arms. A blood pressure reading that is higher than normal on one occasion does not mean that you need treatment.  If it is not clear whether you have high blood pressure, you may be asked to return on a different day to have your blood pressure checked again. Or, you may be asked to monitor your blood pressure at home for 1 or more weeks. TREATMENT Treating high blood pressure includes making lifestyle changes and possibly taking medicine. Living a healthy lifestyle can help lower high blood pressure. You may need to change some of your habits. Lifestyle changes may include:  Following the DASH diet. This diet is high in fruits, vegetables, and whole grains. It is low in salt, red meat, and added sugars.  Keep your sodium intake below 2,300 mg per day.  Getting at least 30-45 minutes of aerobic exercise at least 4 times per week.  Losing weight if necessary.  Not smoking.  Limiting alcoholic beverages.  Learning ways to reduce stress. Your health care provider may prescribe medicine if lifestyle changes are not enough to get your blood pressure under control, and if one of the following is true:  You are 34-43 years of age and your systolic blood pressure is above 140.  You are 20 years of age or older, and your systolic blood pressure is above 150.  Your diastolic blood pressure is above 90.  You have diabetes, and your systolic blood pressure is over [id number] or your diastolic blood pressure is over 90.  You have kidney disease and your blood pressure is above 140/90.  You have heart disease and your blood pressure is above 140/90. Your personal target blood pressure may vary depending on your medical conditions, your age, and other factors. HOME CARE INSTRUCTIONS  Have your blood pressure rechecked as directed by your health care provider.   Take medicines only as directed by your health care provider. Follow the directions carefully. Blood pressure medicines must be taken as prescribed. The medicine does not work as well when you skip doses. Skipping doses also puts you at risk for  problems.  Do not smoke.   Monitor your blood pressure at home as directed by your health care provider. SEEK MEDICAL CARE IF:   You think you are having a reaction to medicines taken.  You have recurrent headaches or feel dizzy.  You have swelling in your ankles.  You have trouble with your vision. SEEK IMMEDIATE MEDICAL CARE IF:  You develop a severe headache or confusion.  You have unusual weakness, numbness, or feel faint.  You have severe chest or abdominal pain.  You vomit repeatedly.  You have trouble breathing. MAKE SURE YOU:   Understand these instructions.  Will watch your condition.  Will get help right away if you are not doing well or get worse.   This information is not intended to replace advice given to you by your health care provider. Make sure you discuss any questions you have with your health care provider.   Document Released: 06/22/2005 Document Revised: 11/06/2014 Document Reviewed: 04/14/2013 Elsevier Interactive Patient Education 2016 Elsevier Inc[employer] Viral Infections A virus is a type of germ. Viruses can cause:  Minor sore throats.  Aches and pains.  Headaches.  Runny nose.  Rashes.  Watery eyes.  Tiredness.  Coughs.  Loss of appetite.  Feeling sick to your stomach (nausea).  Throwing up (vomiting).  Watery poop (diarrhea). HOME CARE   Only take medicines as told by your doctor.  Drink enough water and fluids to keep your pee (urine) clear or pale yellow. Sports drinks are a good choice.  Get plenty of rest and eat healthy. Soups and broths with crackers or rice are fine. GET HELP RIGHT AWAY IF:   You have a very bad headache.  You have shortness of breath.  You have chest pain or neck pain.  You have an unusual rash.  You cannot stop throwing up.  You have watery poop that does not stop.  You cannot keep fluids down.  You or your child has a temperature by mouth above 102 F (38.9 C), not controlled by  medicine.  Your baby is older than 3 months with a rectal temperature of 102 F (38.9 C) or higher.  Your baby is 53 months old or younger with a rectal temperature of 100.4 F (38 C) or higher. MAKE SURE YOU:   Understand these instructions.  Will watch this condition.  Will get help right away if you are not doing well or get worse.   This information is not intended to replace advice given to you by your health care provider. Make sure you discuss any questions you have with your health care provider.   Document Released: 06/04/2008 Document Revised: 09/14/2011 Document Reviewed: 11/28/2014 Elsevier Interactive Patient Education [employer]

## 2015-09-27 ENCOUNTER — Ambulatory Visit: Payer: BC Managed Care – PPO | Admitting: Internal Medicine

## 2015-11-22 ENCOUNTER — Ambulatory Visit: Payer: BC Managed Care – PPO | Admitting: Internal Medicine

## 2015-11-22 DIAGNOSIS — Z0289 Encounter for other administrative examinations: Secondary | ICD-10-CM

## 2015-12-30 ENCOUNTER — Encounter: Payer: Self-pay | Admitting: Internal Medicine

## 2015-12-30 ENCOUNTER — Ambulatory Visit (INDEPENDENT_AMBULATORY_CARE_PROVIDER_SITE_OTHER): Payer: BC Managed Care – PPO | Admitting: Internal Medicine

## 2015-12-30 VITALS — BP 192/120 | HR 70 | Temp 98.3°F | Ht 63.5 in | Wt 136.0 lb

## 2015-12-30 DIAGNOSIS — R4586 Emotional lability: Secondary | ICD-10-CM

## 2015-12-30 DIAGNOSIS — N951 Menopausal and female climacteric states: Secondary | ICD-10-CM

## 2015-12-30 DIAGNOSIS — R232 Flushing: Secondary | ICD-10-CM

## 2015-12-30 DIAGNOSIS — F39 Unspecified mood [affective] disorder: Secondary | ICD-10-CM

## 2015-12-30 DIAGNOSIS — I10 Essential (primary) hypertension: Secondary | ICD-10-CM

## 2015-12-30 DIAGNOSIS — R002 Palpitations: Secondary | ICD-10-CM

## 2015-12-30 LAB — CBC
HCT: 43 % (ref 36.0–46.0)
Hemoglobin: 14 g/dL (ref 12.0–15.0)
MCHC: 32.6 g/dL (ref 30.0–36.0)
MCV: 93.7 fl (ref 78.0–100.0)
PLATELETS: 397 10*3/uL (ref 150.0–400.0)
RBC: 4.58 Mil/uL (ref 3.87–5.11)
RDW: 15.1 % (ref 11.5–15.5)
WBC: 11.6 10*3/uL — AB (ref 4.0–10.5)

## 2015-12-30 LAB — COMPREHENSIVE METABOLIC PANEL
ALT: 22 U/L (ref 0–35)
AST: 22 U/L (ref 0–37)
Albumin: 4.2 g/dL (ref 3.5–5.2)
Alkaline Phosphatase: 66 U/L (ref 39–117)
BUN: 11 mg/dL (ref 6–23)
CALCIUM: 9.5 mg/dL (ref 8.4–10.5)
CHLORIDE: 102 meq/L (ref 96–112)
CO2: 28 meq/L (ref 19–32)
Creatinine, Ser: 0.63 mg/dL (ref 0.40–1.20)
GFR: 130.82 mL/min (ref >60.00)
Glucose, Bld: 94 mg/dL (ref 70–99)
POTASSIUM: 3.5 meq/L (ref 3.5–5.1)
Sodium: 137 mEq/L (ref 135–145)
TOTAL PROTEIN: 7.3 g/dL (ref 6.0–8.3)
Total Bilirubin: 0.4 mg/dL (ref 0.2–1.2)

## 2015-12-30 LAB — LUTEINIZING HORMONE: LH: 72.14 m[IU]/mL

## 2015-12-30 LAB — MAGNESIUM: MAGNESIUM: 2 mg/dL (ref 1.5–2.5)

## 2015-12-30 LAB — TSH: TSH: 0.93 u[IU]/mL (ref 0.35–4.50)

## 2015-12-30 LAB — FOLLICLE STIMULATING HORMONE: FSH: 23.9 m[IU]/mL

## 2015-12-30 MED ORDER — AMLODIPINE BESY-BENAZEPRIL HCL 10-20 MG PO CAPS: 1.0000 | ORAL_CAPSULE | Freq: Every day | ORAL | Status: DC

## 2015-12-30 NOTE — Assessment & Plan Note (Addendum)
BP recheck 200/13221m given 0.1 mg Clonidine PO BP recheck after 15 minutes 190/120, given additional 0.1 mg Clonidine PO BP recheck 15 minutes after 180/100  eRx for Amlodipine-Benazeparil 10-20 mg daily, start today Return precautions given  RTC on Friday for BP check

## 2015-12-30 NOTE — Progress Notes (Signed)
HPI  Patient presents to the clinic today to establish care and for management of the conditions listed below. She is transferring care from [facility] urgent care.  Flu: 03/2014 Tetanus: < 10 years ago, 2011 [facility]([place]) Pap Smear: 2012, partial hysterectomy in 2009 [facility]: [facility] Vision Screening: yearly Dentist: biannually  HTN: Her blood pressure today is 192/120. She is prescribed Norvasc 10 mg daily  but reports she has not taken it in the last 2 weeks. Even when she was taking it, she did not feel like it was effective. She does not monitor her blood pressure at home. She is experiencing headaches and intermittent dizziness. The headaches are located in the back of her head. She describes the pain as throbbing. She denies visual changes. She is having some palpitations, feeling like her heart races for 10-15 seconds and then resolves after deep breathing. She had a EKG in February 2017 which was normal. She does feel anxious at times. She was on lisinopril in the past but reports it caused a cough.  Past Medical History  Diagnosis Date  . Hypertension     Current Outpatient Prescriptions  Medication Sig Dispense Refill  . amLODipine (NORVASC) 10 MG tablet Take 1 tablet (10 mg total) by mouth daily. 30 tablet 2  . [DISCONTINUED] ipratropium (ATROVENT) 0.06 % nasal spray Place 2 sprays into the nose 4 (four) times daily. (Patient not taking: Reported on 09/09/2014) 15 mL 12   No current facility-administered medications for this visit.    No Known Allergies  Family History  Problem Relation Age of Onset  . Hypertension Mother   . Ovarian cancer Paternal Grandmother     Social History   Social History  . Marital Status: Married    Spouse Name: N/A  . Number of Children: N/A  . Years of Education: N/A   Occupational History  . Not on file.   Social History Main Topics  . Smoking status: Light Tobacco Smoker -- 0.30 packs/day  . Smokeless  tobacco: Not on file  . Alcohol Use: Yes     Comment: occasional   . Drug Use: Not on file  . Sexual Activity: Yes    Birth Control/ Protection: Surgical   Other Topics Concern  . Not on file   Social History Narrative    ROS:  Constitutional: Pt reports headache, hot flashes. Denies fever, malaise, fatigue, or abrupt weight changes.  HEENT: Denies eye pain, eye redness, ear pain, ringing in the ears, wax buildup, runny nose, nasal congestion, bloody nose, or sore throat. Respiratory: Denies difficulty breathing, shortness of breath, cough or sputum production.   Cardiovascular: Denies chest pain, chest tightness, palpitations or swelling in the hands or feet.  Gastrointestinal: Pt reports occasional reflux. Denies abdominal pain, bloating, constipation, diarrhea or blood in the stool.  Neurological: Pt reports intermittent dizziness. Denies difficulty with memory, difficulty with speech or problems with balance and coordination.  Psych: Pt reports irritability and anxiety. Denies depression, SI/HI.  No other specific complaints in a complete review of systems (except as listed in HPI above).  PE:  BP 192/120 mmHg  Pulse 70  Temp(Src) 98.3 F (36.8 C)  Ht 5' 3.5" (1.613 m)  Wt 136 lb (61.689 kg)  BMI 23.71 kg/m2  SpO2 98% Wt Readings from Last 3 Encounters:  12/30/15 136 lb (61.689 kg)  03/27/15 143 lb (64.864 kg)  01/26/07 117 lb 11.2 oz (53.388 kg)    General: Appears her stated age, well  developed, well nourished in NAD. Skin: Dry and intact. HEENT: Head: normal shape and size; Eyes: sclera white, no icterus, conjunctiva pink, PERRLA and EOMs intact;  Neck: Neck supple, trachea midline. No masses, lumps or thyromegaly present.  Cardiovascular: Normal rate and rhythm. S1,S2 noted.  No murmur, rubs or gallops noted. Trace BLE edema.  Pulmonary/Chest: Normal effort and positive vesicular breath sounds. No respiratory distress. No wheezes, rales or ronchi noted.   Neurological: Alert and oriented.  Coordination normal.  Psychiatric: She is mildly anxious appearing today.  BMET    Component Value Date/Time   NA 142 08/16/2015 1302   K 3.4* 08/16/2015 1302   CL 101 08/16/2015 1302   CO2 28 08/16/2015 1302   GLUCOSE 94 08/16/2015 1302   BUN 7 08/16/2015 1302   CREATININE 0.69 08/16/2015 1302   CALCIUM 9.4 08/16/2015 1302   GFRNONAA >60 08/16/2015 1302   GFRAA >60 08/16/2015 1302    Lipid Panel  No results found for: CHOL, TRIG, HDL, CHOLHDL, VLDL, LDLCALC  CBC    Component Value Date/Time   WBC 7.7 08/16/2015 1302   WBC 8.6 05/10/2007 1305   RBC 4.57 08/16/2015 1302   RBC 3.67* 12/22/2007 0405   RBC 4.32 05/10/2007 1305   HGB 14.1 08/16/2015 1302   HGB 12.3 05/10/2007 1305   HCT 43.7 08/16/2015 1302   HCT 35.8 05/10/2007 1305   PLT 366 08/16/2015 1302   PLT 515* 05/10/2007 1305   MCV 95.6 08/16/2015 1302   MCV 82.8 05/10/2007 1305   MCH 30.9 08/16/2015 1302   MCH 28.5 05/10/2007 1305   MCHC 32.3 08/16/2015 1302   MCHC 34.3 05/10/2007 1305   RDW 15.3 08/16/2015 1302   RDW 15.1* 05/10/2007 1305   LYMPHSABS 2.4 09/09/2014 1054   LYMPHSABS 2.4 05/10/2007 1305   MONOABS 0.8 09/09/2014 1054   MONOABS 0.4 05/10/2007 1305   EOSABS 0.0 09/09/2014 1054   EOSABS 0.1 05/10/2007 1305   BASOSABS 0.0 09/09/2014 1054   BASOSABS 0.0 05/10/2007 1305    Hgb A1C No results found for: HGBA1C   Assessment and Plan:  Hot flashes and mood changes hot flashes and mood changes:  Will check FSH and LH today  Palpitations:  EKG reviewed We'll check CBC, TSH, CMET and magnesium If labs are normal, will consider cardiology referral for Holter Monitor

## 2015-12-30 NOTE — Patient Instructions (Signed)
Hypertension Hypertension, commonly called high blood pressure, is when the force of blood pumping through your arteries is too strong. Your arteries are the blood vessels that carry blood from your heart throughout your body. A blood pressure reading consists of a higher number over a lower number, such as 110/72. The higher number (systolic) is the pressure inside your arteries when your heart pumps. The lower number (diastolic) is the pressure inside your arteries when your heart relaxes. Ideally you want your blood pressure below 120/80. Hypertension forces your heart to work harder to pump blood. Your arteries may become narrow or stiff. Having untreated or uncontrolled hypertension can cause heart attack, stroke, kidney disease, and other problems. RISK FACTORS Some risk factors for high blood pressure are controllable. Others are not.  Risk factors you cannot control include:   Race. You may be at higher risk if you are African American.  Age. Risk increases with age.  Gender. Men are at higher risk than women before age 45 years. After age 65, women are at higher risk than men. Risk factors you can control include:  Not getting enough exercise or physical activity.  Being overweight.  Getting too much fat, sugar, calories, or salt in your diet.  Drinking too much alcohol. SIGNS AND SYMPTOMS Hypertension does not usually cause signs or symptoms. Extremely high blood pressure (hypertensive crisis) may cause headache, anxiety, shortness of breath, and nosebleed. DIAGNOSIS To check if you have hypertension, your health care provider will measure your blood pressure while you are seated, with your arm held at the level of your heart. It should be measured at least twice using the same arm. Certain conditions can cause a difference in blood pressure between your right and left arms. A blood pressure reading that is higher than normal on one occasion does not mean that you need treatment. If  it is not clear whether you have high blood pressure, you may be asked to return on a different day to have your blood pressure checked again. Or, you may be asked to monitor your blood pressure at home for 1 or more weeks. TREATMENT Treating high blood pressure includes making lifestyle changes and possibly taking medicine. Living a healthy lifestyle can help lower high blood pressure. You may need to change some of your habits. Lifestyle changes may include:  Following the DASH diet. This diet is high in fruits, vegetables, and whole grains. It is low in salt, red meat, and added sugars.  Keep your sodium intake below 2,300 mg per day.  Getting at least 30-45 minutes of aerobic exercise at least 4 times per week.  Losing weight if necessary.  Not smoking.  Limiting alcoholic beverages.  Learning ways to reduce stress. Your health care provider may prescribe medicine if lifestyle changes are not enough to get your blood pressure under control, and if one of the following is true:  You are 18-59 years of age and your systolic blood pressure is above 140.  You are 60 years of age or older, and your systolic blood pressure is above 150.  Your diastolic blood pressure is above 90.  You have diabetes, and your systolic blood pressure is over 140 or your diastolic blood pressure is over 90.  You have kidney disease and your blood pressure is above 140/90.  You have heart disease and your blood pressure is above 140/90. Your personal target blood pressure may vary depending on your medical conditions, your age, and other factors. HOME CARE INSTRUCTIONS    Have your blood pressure rechecked as directed by your health care provider.   Take medicines only as directed by your health care provider. Follow the directions carefully. Blood pressure medicines must be taken as prescribed. The medicine does not work as well when you skip doses. Skipping doses also puts you at risk for  problems.  Do not smoke.   Monitor your blood pressure at home as directed by your health care provider. SEEK MEDICAL CARE IF:   You think you are having a reaction to medicines taken.  You have recurrent headaches or feel dizzy.  You have swelling in your ankles.  You have trouble with your vision. SEEK IMMEDIATE MEDICAL CARE IF:  You develop a severe headache or confusion.  You have unusual weakness, numbness, or feel faint.  You have severe chest or abdominal pain.  You vomit repeatedly.  You have trouble breathing. MAKE SURE YOU:   Understand these instructions.  Will watch your condition.  Will get help right away if you are not doing well or get worse.   This information is not intended to replace advice given to you by your health care provider. Make sure you discuss any questions you have with your health care provider.   Document Released: 06/22/2005 Document Revised: 11/06/2014 Document Reviewed: 04/14/2013 Elsevier Interactive Patient Education 2016 Elsevier Inc.  

## 2016-01-03 ENCOUNTER — Encounter: Payer: Self-pay | Admitting: Internal Medicine

## 2016-01-03 ENCOUNTER — Ambulatory Visit (INDEPENDENT_AMBULATORY_CARE_PROVIDER_SITE_OTHER): Payer: BC Managed Care – PPO | Admitting: Internal Medicine

## 2016-01-03 VITALS — BP 154/108 | HR 84 | Temp 98.8°F | Wt 134.0 lb

## 2016-01-03 DIAGNOSIS — I10 Essential (primary) hypertension: Secondary | ICD-10-CM

## 2016-01-03 MED ORDER — AMLODIPINE BESY-BENAZEPRIL HCL 10-40 MG PO CAPS: 1.0000 | ORAL_CAPSULE | Freq: Every day | ORAL | Status: DC

## 2016-01-03 NOTE — Progress Notes (Signed)
Pre visit review using our clinic review tool, if applicable. No additional management support is needed unless otherwise documented below in the visit note. 

## 2016-01-03 NOTE — Assessment & Plan Note (Signed)
Better, but still not at goal.  Increase Lotrel to 10-40  RTC in 3 weeks to follow up BP

## 2016-01-03 NOTE — Progress Notes (Signed)
Subjective:    Patient ID: [name], female    DOB: February 05, 1971, 45 y.o.   MRN: [id number]  HPI  Pt presents to the clinic today for follow up of HTN. At her establish care apt, her BP was 192/120. She was given Clonidine x 2 in the office and given RX for Amlodipine-Benazepril 10-20. She has been taking the medication as directed and denies adverse side effects. Her BP today is. ECG from 08/2015 reviewed.  Review of Systems      Past Medical History  Diagnosis Date  . Hypertension     Current Outpatient Prescriptions  Medication Sig Dispense Refill  . amLODipine (NORVASC) 10 MG tablet Take 1 tablet (10 mg total) by mouth daily. 30 tablet 2  . amLODipine-benazepril (LOTREL) 10-20 MG capsule Take 1 capsule by mouth daily. 30 capsule 0  . [DISCONTINUED] ipratropium (ATROVENT) 0.06 % nasal spray Place 2 sprays into the nose 4 (four) times daily. (Patient not taking: Reported on 09/09/2014) 15 mL 12   No current facility-administered medications for this visit.    No Known Allergies  Family History  Problem Relation Age of Onset  . Hypertension Mother   . Ovarian cancer Paternal Grandmother     Social History   Social History  . Marital Status: Married    Spouse Name: N/A  . Number of Children: N/A  . Years of Education: N/A   Occupational History  . Not on file.   Social History Main Topics  . Smoking status: Light Tobacco Smoker -- 0.30 packs/day  . Smokeless tobacco: Not on file  . Alcohol Use: Yes     Comment: occasional   . Drug Use: Not on file  . Sexual Activity: Yes    Birth Control/ Protection: Surgical   Other Topics Concern  . Not on file   Social History Narrative     Constitutional: Denies fever, malaise, fatigue, headache or abrupt weight changes.  Respiratory: Denies difficulty breathing, shortness of breath, cough or sputum production.   Cardiovascular: Denies chest pain, chest tightness, palpitations or swelling in the hands or feet.   Neurological: Denies dizziness, difficulty with memory, difficulty with speech or problems with balance and coordination.    No other specific complaints in a complete review of systems (except as listed in HPI above).  Objective:   Physical Exam  BP 154/108 mmHg  Pulse 84  Temp(Src) 98.8 F (37.1 C) (Oral)  Wt 134 lb (60.782 kg)  SpO2 98% Wt Readings from Last 3 Encounters:  01/03/16 134 lb (60.782 kg)  12/30/15 136 lb (61.689 kg)  03/27/15 143 lb (64.864 kg)    General: Appears her stated age, well developed, well nourished in NAD. HEENT: Head: normal shape and size; Eyes: sclera white, no icterus, conjunctiva pink, PERRLA and EOMs intact;  Cardiovascular: Normal rate and rhythm. S1,S2 noted.  No murmur, rubs or gallops noted.  Pulmonary/Chest: Normal effort and positive vesicular breath sounds. No respiratory distress. No wheezes, rales or ronchi noted.  Neurological: Alert and oriented.    BMET    Component Value Date/Time   NA 137 12/30/2015 1201   K 3.5 12/30/2015 1201   CL 102 12/30/2015 1201   CO2 28 12/30/2015 1201   GLUCOSE 94 12/30/2015 1201   BUN 11 12/30/2015 1201   CREATININE 0.63 12/30/2015 1201   CALCIUM 9.5 12/30/2015 1201   GFRNONAA >60 08/16/2015 1302   GFRAA >60 08/16/2015 1302    Lipid Panel  No results found for:  CHOL, TRIG, HDL, CHOLHDL, VLDL, LDLCALC  CBC    Component Value Date/Time   WBC 11.6* 12/30/2015 1201   WBC 8.6 05/10/2007 1305   RBC 4.58 12/30/2015 1201   RBC 3.67* 12/22/2007 0405   RBC 4.32 05/10/2007 1305   HGB 14.0 12/30/2015 1201   HGB 12.3 05/10/2007 1305   HCT 43.0 12/30/2015 1201   HCT 35.8 05/10/2007 1305   PLT 397.0 12/30/2015 1201   PLT 515* 05/10/2007 1305   MCV 93.7 12/30/2015 1201   MCV 82.8 05/10/2007 1305   MCH 30.9 08/16/2015 1302   MCH 28.5 05/10/2007 1305   MCHC 32.6 12/30/2015 1201   MCHC 34.3 05/10/2007 1305   RDW 15.1 12/30/2015 1201   RDW 15.1* 05/10/2007 1305   LYMPHSABS 2.4 09/09/2014 1054     LYMPHSABS 2.4 05/10/2007 1305   MONOABS 0.8 09/09/2014 1054   MONOABS 0.4 05/10/2007 1305   EOSABS 0.0 09/09/2014 1054   EOSABS 0.1 05/10/2007 1305   BASOSABS 0.0 09/09/2014 1054   BASOSABS 0.0 05/10/2007 1305    Hgb A1C No results found for: HGBA1C       Assessment & Plan:

## 2016-01-03 NOTE — Patient Instructions (Signed)
Hypertension Hypertension, commonly called high blood pressure, is when the force of blood pumping through your arteries is too strong. Your arteries are the blood vessels that carry blood from your heart throughout your body. A blood pressure reading consists of a higher number over a lower number, such as 110/72. The higher number (systolic) is the pressure inside your arteries when your heart pumps. The lower number (diastolic) is the pressure inside your arteries when your heart relaxes. Ideally you want your blood pressure below 120/80. Hypertension forces your heart to work harder to pump blood. Your arteries may become narrow or stiff. Having untreated or uncontrolled hypertension can cause heart attack, stroke, kidney disease, and other problems. RISK FACTORS Some risk factors for high blood pressure are controllable. Others are not.  Risk factors you cannot control include:   Race. You may be at higher risk if you are African American.  Age. Risk increases with age.  Gender. Men are at higher risk than women before age 45 years. After age 65, women are at higher risk than men. Risk factors you can control include:  Not getting enough exercise or physical activity.  Being overweight.  Getting too much fat, sugar, calories, or salt in your diet.  Drinking too much alcohol. SIGNS AND SYMPTOMS Hypertension does not usually cause signs or symptoms. Extremely high blood pressure (hypertensive crisis) may cause headache, anxiety, shortness of breath, and nosebleed. DIAGNOSIS To check if you have hypertension, your health care provider will measure your blood pressure while you are seated, with your arm held at the level of your heart. It should be measured at least twice using the same arm. Certain conditions can cause a difference in blood pressure between your right and left arms. A blood pressure reading that is higher than normal on one occasion does not mean that you need treatment. If  it is not clear whether you have high blood pressure, you may be asked to return on a different day to have your blood pressure checked again. Or, you may be asked to monitor your blood pressure at home for 1 or more weeks. TREATMENT Treating high blood pressure includes making lifestyle changes and possibly taking medicine. Living a healthy lifestyle can help lower high blood pressure. You may need to change some of your habits. Lifestyle changes may include:  Following the DASH diet. This diet is high in fruits, vegetables, and whole grains. It is low in salt, red meat, and added sugars.  Keep your sodium intake below 2,300 mg per day.  Getting at least 30-45 minutes of aerobic exercise at least 4 times per week.  Losing weight if necessary.  Not smoking.  Limiting alcoholic beverages.  Learning ways to reduce stress. Your health care provider may prescribe medicine if lifestyle changes are not enough to get your blood pressure under control, and if one of the following is true:  You are 18-59 years of age and your systolic blood pressure is above 140.  You are 60 years of age or older, and your systolic blood pressure is above 150.  Your diastolic blood pressure is above 90.  You have diabetes, and your systolic blood pressure is over 140 or your diastolic blood pressure is over 90.  You have kidney disease and your blood pressure is above 140/90.  You have heart disease and your blood pressure is above 140/90. Your personal target blood pressure may vary depending on your medical conditions, your age, and other factors. HOME CARE INSTRUCTIONS    Have your blood pressure rechecked as directed by your health care provider.   Take medicines only as directed by your health care provider. Follow the directions carefully. Blood pressure medicines must be taken as prescribed. The medicine does not work as well when you skip doses. Skipping doses also puts you at risk for  problems.  Do not smoke.   Monitor your blood pressure at home as directed by your health care provider. SEEK MEDICAL CARE IF:   You think you are having a reaction to medicines taken.  You have recurrent headaches or feel dizzy.  You have swelling in your ankles.  You have trouble with your vision. SEEK IMMEDIATE MEDICAL CARE IF:  You develop a severe headache or confusion.  You have unusual weakness, numbness, or feel faint.  You have severe chest or abdominal pain.  You vomit repeatedly.  You have trouble breathing. MAKE SURE YOU:   Understand these instructions.  Will watch your condition.  Will get help right away if you are not doing well or get worse.   This information is not intended to replace advice given to you by your health care provider. Make sure you discuss any questions you have with your health care provider.   Document Released: 06/22/2005 Document Revised: 11/06/2014 Document Reviewed: 04/14/2013 Elsevier Interactive Patient Education 2016 Elsevier Inc.  

## 2016-01-28 ENCOUNTER — Ambulatory Visit (INDEPENDENT_AMBULATORY_CARE_PROVIDER_SITE_OTHER): Payer: BC Managed Care – PPO | Admitting: Internal Medicine

## 2016-01-28 ENCOUNTER — Encounter: Payer: Self-pay | Admitting: Internal Medicine

## 2016-01-28 VITALS — BP 130/90 | HR 98 | Temp 98.9°F | Wt 134.0 lb

## 2016-01-28 DIAGNOSIS — R002 Palpitations: Secondary | ICD-10-CM

## 2016-01-28 DIAGNOSIS — I10 Essential (primary) hypertension: Secondary | ICD-10-CM | POA: Diagnosis not present

## 2016-01-28 DIAGNOSIS — R0602 Shortness of breath: Secondary | ICD-10-CM

## 2016-01-28 MED ORDER — AMLODIPINE BESY-BENAZEPRIL HCL 10-20 MG PO CAPS: 1.0000 | ORAL_CAPSULE | Freq: Every day | ORAL | 5 refills | Status: DC

## 2016-01-28 NOTE — Patient Instructions (Signed)
Hypertension Hypertension, commonly called high blood pressure, is when the force of blood pumping through your arteries is too strong. Your arteries are the blood vessels that carry blood from your heart throughout your body. A blood pressure reading consists of a higher number over a lower number, such as 110/72. The higher number (systolic) is the pressure inside your arteries when your heart pumps. The lower number (diastolic) is the pressure inside your arteries when your heart relaxes. Ideally you want your blood pressure below 120/80. Hypertension forces your heart to work harder to pump blood. Your arteries may become narrow or stiff. Having untreated or uncontrolled hypertension can cause heart attack, stroke, kidney disease, and other problems. RISK FACTORS Some risk factors for high blood pressure are controllable. Others are not.  Risk factors you cannot control include:   Race. You may be at higher risk if you are African American.  Age. Risk increases with age.  Gender. Men are at higher risk than women before age 45 years. After age 65, women are at higher risk than men. Risk factors you can control include:  Not getting enough exercise or physical activity.  Being overweight.  Getting too much fat, sugar, calories, or salt in your diet.  Drinking too much alcohol. SIGNS AND SYMPTOMS Hypertension does not usually cause signs or symptoms. Extremely high blood pressure (hypertensive crisis) may cause headache, anxiety, shortness of breath, and nosebleed. DIAGNOSIS To check if you have hypertension, your health care provider will measure your blood pressure while you are seated, with your arm held at the level of your heart. It should be measured at least twice using the same arm. Certain conditions can cause a difference in blood pressure between your right and left arms. A blood pressure reading that is higher than normal on one occasion does not mean that you need treatment. If  it is not clear whether you have high blood pressure, you may be asked to return on a different day to have your blood pressure checked again. Or, you may be asked to monitor your blood pressure at home for 1 or more weeks. TREATMENT Treating high blood pressure includes making lifestyle changes and possibly taking medicine. Living a healthy lifestyle can help lower high blood pressure. You may need to change some of your habits. Lifestyle changes may include:  Following the DASH diet. This diet is high in fruits, vegetables, and whole grains. It is low in salt, red meat, and added sugars.  Keep your sodium intake below 2,300 mg per day.  Getting at least 30-45 minutes of aerobic exercise at least 4 times per week.  Losing weight if necessary.  Not smoking.  Limiting alcoholic beverages.  Learning ways to reduce stress. Your health care provider may prescribe medicine if lifestyle changes are not enough to get your blood pressure under control, and if one of the following is true:  You are 18-59 years of age and your systolic blood pressure is above 140.  You are 60 years of age or older, and your systolic blood pressure is above 150.  Your diastolic blood pressure is above 90.  You have diabetes, and your systolic blood pressure is over 140 or your diastolic blood pressure is over 90.  You have kidney disease and your blood pressure is above 140/90.  You have heart disease and your blood pressure is above 140/90. Your personal target blood pressure may vary depending on your medical conditions, your age, and other factors. HOME CARE INSTRUCTIONS    Have your blood pressure rechecked as directed by your health care provider.   Take medicines only as directed by your health care provider. Follow the directions carefully. Blood pressure medicines must be taken as prescribed. The medicine does not work as well when you skip doses. Skipping doses also puts you at risk for  problems.  Do not smoke.   Monitor your blood pressure at home as directed by your health care provider. SEEK MEDICAL CARE IF:   You think you are having a reaction to medicines taken.  You have recurrent headaches or feel dizzy.  You have swelling in your ankles.  You have trouble with your vision. SEEK IMMEDIATE MEDICAL CARE IF:  You develop a severe headache or confusion.  You have unusual weakness, numbness, or feel faint.  You have severe chest or abdominal pain.  You vomit repeatedly.  You have trouble breathing. MAKE SURE YOU:   Understand these instructions.  Will watch your condition.  Will get help right away if you are not doing well or get worse.   This information is not intended to replace advice given to you by your health care provider. Make sure you discuss any questions you have with your health care provider.   Document Released: 06/22/2005 Document Revised: 11/06/2014 Document Reviewed: 04/14/2013 Elsevier Interactive Patient Education 2016 Elsevier Inc.  

## 2016-01-28 NOTE — Progress Notes (Signed)
Pre visit review using our clinic review tool, if applicable. No additional management support is needed unless otherwise documented below in the visit note. 

## 2016-01-28 NOTE — Progress Notes (Signed)
   Subjective:    Patient ID: [name], female    DOB: 03/28/1971, 45 y.o.   MRN: [id number]  HPI  Pt presents to the clinic today for follow-up of hypertension.  At her visit to establish care on 01/03/16, her blood pressure was 192/120, and she was started on Amlodipine-Benazapril 10-20.  At the follow-up visit 4 days later her blood pressure was 154/108, and she was prescribed Amlodipine-Benazapril 10-40, but reports she never took the higher dose and continued the lower dose.  Today, her blood pressure is 130/74mmHg and she reports feeling much better overall.  She admits to daily palpitations several times a day x 1 month that last a few seconds at a time and resolve on their own, and daily shortness of breath with activity.  She also reports daily cough with occasional clear sputum production, but notes the cough is manageable and was much worse when she took Lisinopril in the past.  She denies dizziness, headaches, vision changes, chest pain, or swelling in extremities.     Review of Systems   Past Medical History:  Diagnosis Date  . Hypertension     Current Outpatient Prescriptions  Medication Sig Dispense Refill  . amLODipine-benazepril (LOTREL) 10-20 MG capsule Take 1 capsule by mouth daily. 30 capsule 5   No current facility-administered medications for this visit.     No Known Allergies  Family History  Problem Relation Age of Onset  . Hypertension Mother   . Ovarian cancer Paternal Grandmother     Social History   Social History  . Marital status: Married    Spouse name: N/A  . Number of children: N/A  . Years of education: N/A   Occupational History  . Not on file.   Social History Main Topics  . Smoking status: Light Tobacco Smoker    Packs/day: 0.30  . Smokeless tobacco: Never Used  . Alcohol use Yes     Comment: occasional   . Drug use: Unknown  . Sexual activity: Yes    Birth control/ protection: Surgical   Other Topics Concern  . Not  on file   Social History Narrative  . No narrative on file     Const: Denies dizziness or headaches. Pulm: Pt reports shortness of breath, cough, and sputum production. CV: Pt reports palpitations. Denies chest pain. Ext: Denies swelling in hands or feet.   No other specific complaints in a complete review of systems (except as listed in HPI above).     Objective:   Physical Exam BP 130/90   Pulse 98   Temp 98.9 F (37.2 C) (Oral)   Wt 134 lb (60.8 kg)   SpO2 99%   BMI 23.36 kg/m   General:  Well-appearing, in no acute distress. Pulm: Clear to auscultation bilaterally.  No wheezes, rales, or rhonchi. CV:  Regular rate and rhythm.  No murmurs, rubs, or gallops.  No carotid bruits noted.   Ext: No peripheral edema present.      Assessment & Plan:

## 2016-01-28 NOTE — Assessment & Plan Note (Addendum)
Now at goal Some palpitations, intermittent shortness of breath- referral to cardiology to assess need for stress test ECG, TSH and CMET reviewed Continue Lotrel 10-20 at this time  RTC in 6 months for your annual exam

## 2016-02-04 ENCOUNTER — Encounter: Payer: Self-pay | Admitting: Cardiology

## 2016-02-17 NOTE — Progress Notes (Signed)
Cardiology Office Note    Date:  02/18/2016   ID:  Melissa PeeksMichelle R Delaluz, DOB [birth year], MRN [id number]  PCP:  Nicki ReaperBAITY, REGINA, NP  Cardiologist:  Armanda Magicraci Derrell Milanes, MD   Chief Complaint  Patient presents with  . Palpitations  . Shortness of Breath  . Hypertension    History of Present Illness:  [name] is a 45 y.o. female with a history of HTN who presents today for evaluation of palpitations.  She says that sometimes her heart will feel like it is racing and then sometimes it is jumping.  This has been going on for about 2-3 months.  The episodes last about 5 minutes and then resolve but will occur several times a day.  She denies any dizziness or syncope.  She occasionally will notice some SOB with the episodes.  She drinks ice tea all day but no other caffeine.  She has also noticed some DOE when doing some that strenuous activity.  She denies any chest pain but occasionally notices pressure in her chest but only with the palpitations.      Past Medical History:  Diagnosis Date  . Hypertension     Past Surgical History:  Procedure Laterality Date  . ABDOMINAL HYSTERECTOMY    . TUBAL LIGATION      Current Medications: Outpatient Medications Prior to Visit  Medication Sig Dispense Refill  . amLODipine-benazepril (LOTREL) 10-20 MG capsule Take 1 capsule by mouth daily. 30 capsule 5   No facility-administered medications prior to visit.      Allergies:   Review of patient's allergies indicates no known allergies.   Social History   Social History  . Marital status: Married    Spouse name: N/A  . Number of children: N/A  . Years of education: N/A   Social History Main Topics  . Smoking status: Light Tobacco Smoker    Packs/day: 0.30  . Smokeless tobacco: Never Used  . Alcohol use Yes     Comment: occasional   . Drug use: Unknown  . Sexual activity: Yes    Birth control/ protection: Surgical   Other Topics Concern  . None   Social History Narrative  .  None     Family History:  The patient's family history includes Heart murmur in her father; Hypertension in her mother; Lupus in her sister; Ovarian cancer in her paternal grandmother.   ROS:   Please see the history of present illness.    ROS All other systems reviewed and are negative.   PHYSICAL EXAM:   VS:  BP 124/86   Pulse 96   Ht 5' 3.5" (1.613 m)   Wt 133 lb 1.9 oz (60.4 kg)   SpO2 97%   BMI 23.21 kg/m    GEN: Well nourished, well developed, in no acute distress  HEENT: normal  Neck: no JVD, carotid bruits, or masses Cardiac: RRR; no murmurs, rubs, or gallops,no edema.  Intact distal pulses bilaterally.  Respiratory:  clear to auscultation bilaterally, normal work of breathing GI: soft, nontender, nondistended, + BS MS: no deformity or atrophy  Skin: warm and dry, no rash Neuro:  Alert and Oriented x 3, Strength and sensation are intact Psych: euthymic mood, full affect  Wt Readings from Last 3 Encounters:  02/18/16 133 lb 1.9 oz (60.4 kg)  01/28/16 134 lb (60.8 kg)  01/03/16 134 lb (60.8 kg)      Studies/Labs Reviewed:   EKG:  EKG is ordered today.  The ekg ordered today  demonstrates NSR at 87bpm with no ST changes and septal infarct  Recent Labs: 12/30/2015: ALT 22; BUN 11; Creatinine, Ser 0.63; Hemoglobin 14.0; Magnesium 2.0; Platelets 397.0; Potassium 3.5; Sodium 137; TSH 0.93   Lipid Panel No results found for: CHOL, TRIG, HDL, CHOLHDL, VLDL, LDLCALC, LDLDIRECT  Additional studies/ records that were reviewed today include:  OV notes from PCP    ASSESSMENT:    1. Heart palpitations   2. HYPERTENSION, BENIGN ESSENTIAL   3. SOB (shortness of breath)      PLAN:  In order of problems listed above:  1. Heart Palpitations - I will get a 30 day heart monitor to assess for arrhythmias.   2. HTN - BP controlled on current medical therapy.  Continue Lotrel. 3. 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  She has no exertional CP but does have some CP with the  palpitations.  I suspect this is due to poorly controlled BP and diastolic dysfunction but she does have CRF including HTN and occasional tobacco use so will get a stress myoview to rule out ischemia and Check 2D echo to assess LVF and diastolic function.     Medication Adjustments/Labs and Tests Ordered: Current medicines are reviewed at length with the patient today.  Concerns regarding medicines are outlined above.  Medication changes, Labs and Tests ordered today are listed in the Patient Instructions below.  Patient Instructions  Medication Instructions:  Your physician recommends that you continue on your current medications as directed. Please refer to the Current Medication list given to you today.   Labwork: None  Testing/Procedures: Your physician has requested that you have an echocardiogram. Echocardiography is a painless test that uses sound waves to create images of your heart. It provides your doctor with information about the size and shape of your heart and how well your heart's chambers and valves are working. This procedure takes approximately one hour. There are no restrictions for this procedure.  Your physician has recommended that you wear an event monitor. Event monitors are medical devices that record the heart's electrical activity. Doctors most often us these monitors to diagnose arrhythmias. Arrhythmias are problems with the speed or rhythm of the heartbeat. The monitor is a small, portable device. You can wear one while you do your normal daily activities. This is usually used to diagnose what is causing palpitations/syncope (passing out).   Dr. Mayford Knifeurner recommends you have a NUCLEAR STRESS TEST.  Follow-Up: Your physician recommends that you schedule a follow-up appointment AS NEEDED with Dr. Mayford Knifeurner pending study results.   Any Other Special Instructions Will Be Listed Below (If Applicable).     If you need a refill on your cardiac medications before your next  appointment, please call your pharmacy.      Signed, Armanda Magicraci Sricharan Lacomb, MD  02/18/2016 8:06 PM    [facility] [address], [place], [place]  [address] Phone: [phone]; Fax: [phone]

## 2016-02-18 ENCOUNTER — Encounter: Payer: Self-pay | Admitting: Cardiology

## 2016-02-18 ENCOUNTER — Ambulatory Visit (INDEPENDENT_AMBULATORY_CARE_PROVIDER_SITE_OTHER): Payer: BC Managed Care – PPO | Admitting: Cardiology

## 2016-02-18 VITALS — BP 124/86 | HR 96 | Ht 63.5 in | Wt 133.1 lb

## 2016-02-18 DIAGNOSIS — I10 Essential (primary) hypertension: Secondary | ICD-10-CM

## 2016-02-18 DIAGNOSIS — R0602 Shortness of breath: Secondary | ICD-10-CM | POA: Diagnosis not present

## 2016-02-18 DIAGNOSIS — R002 Palpitations: Secondary | ICD-10-CM

## 2016-02-18 NOTE — Patient Instructions (Signed)
Medication Instructions:  Your physician recommends that you continue on your current medications as directed. Please refer to the Current Medication list given to you today.   Labwork: None  Testing/Procedures: Your physician has requested that you have an echocardiogram. Echocardiography is a painless test that uses sound waves to create images of your heart. It provides your doctor with information about the size and shape of your heart and how well your heart's chambers and valves are working. This procedure takes approximately one hour. There are no restrictions for this procedure.   Your physician has recommended that you wear an event monitor. Event monitors are medical devices that record the heart's electrical activity. Doctors most often us these monitors to diagnose arrhythmias. Arrhythmias are problems with the speed or rhythm of the heartbeat. The monitor is a small, portable device. You can wear one while you do your normal daily activities. This is usually used to diagnose what is causing palpitations/syncope (passing out).  Dr. Turner recommends you have a NUCLEAR STRESS TEST.  Follow-Up: Your physician recommends that you schedule a follow-up appointment AS NEEDED with Dr. Turner pending study results.   Any Other Special Instructions Will Be Listed Below (If Applicable).     If you need a refill on your cardiac medications before your next appointment, please call your pharmacy.    

## 2016-02-19 ENCOUNTER — Ambulatory Visit: Payer: BC Managed Care – PPO | Admitting: Cardiology

## 2016-02-21 ENCOUNTER — Telehealth: Payer: Self-pay

## 2016-02-21 DIAGNOSIS — R079 Chest pain, unspecified: Secondary | ICD-10-CM

## 2016-02-21 NOTE — Telephone Encounter (Signed)
-----   Message from [facility] LLPCharmaine M Hall sent at 02/21/2016  9:09 AM EDT ----- Regarding: FW: Almost denied myoview Katie,  Can you put order in for gxt?  Will need to let pt know.    Deb  Can you please canx nuc test and schedule gxt?  Thanks  Charmaine ----- Message ----- From: Quintella Reichertraci R Turner, MD Sent: 02/20/2016   4:17 PM To: Britt Bologneseharmaine M Hall Subject: RE: Almost denied myoview                      Change to GXT  Armanda Magicraci Turner, MD ----- Message ----- From: Britt Bologneseharmaine M Hall Sent: 02/20/2016   2:33 PM To: Quintella Reichertraci R Turner, MD, Henrietta DineKathryn A Jalacia Mattila, RN Subject: Almost denied myoview                          Dr. Mayford Knifeurner  Case is pending denial by insurance MD director. Case closes tomorrow.  They state pt need to have a regular treadmill.  I discussed w/nurse sob, cp, and septal infarct ekg.  No go.  Do you want to do md review or change to gxt?  Please call [employer], [phone] and follow prompts for MD review  Policy#[id number]  Thanks  Charmaine

## 2016-02-21 NOTE — Telephone Encounter (Signed)
Informed patient [name] was not covered by insurance, so nuclear stress test is cancelled. Reviewed GXT instructions with patient.  GXT ordered for scheduling. Patient was grateful for call.

## 2016-02-27 ENCOUNTER — Ambulatory Visit (INDEPENDENT_AMBULATORY_CARE_PROVIDER_SITE_OTHER): Payer: BC Managed Care – PPO

## 2016-02-27 DIAGNOSIS — R002 Palpitations: Secondary | ICD-10-CM

## 2016-02-28 ENCOUNTER — Ambulatory Visit: Payer: BC Managed Care – PPO | Admitting: Cardiology

## 2016-02-28 ENCOUNTER — Other Ambulatory Visit (HOSPITAL_COMMUNITY): Payer: BC Managed Care – PPO

## 2016-02-28 ENCOUNTER — Encounter (HOSPITAL_COMMUNITY): Payer: BC Managed Care – PPO

## 2016-03-05 ENCOUNTER — Other Ambulatory Visit: Payer: Self-pay

## 2016-03-05 ENCOUNTER — Ambulatory Visit (INDEPENDENT_AMBULATORY_CARE_PROVIDER_SITE_OTHER): Payer: BC Managed Care – PPO

## 2016-03-05 ENCOUNTER — Ambulatory Visit (HOSPITAL_COMMUNITY): Payer: BC Managed Care – PPO | Attending: Cardiology

## 2016-03-05 DIAGNOSIS — R079 Chest pain, unspecified: Secondary | ICD-10-CM | POA: Diagnosis not present

## 2016-03-05 DIAGNOSIS — R0600 Dyspnea, unspecified: Secondary | ICD-10-CM | POA: Diagnosis present

## 2016-03-05 DIAGNOSIS — R002 Palpitations: Secondary | ICD-10-CM | POA: Insufficient documentation

## 2016-03-05 DIAGNOSIS — I119 Hypertensive heart disease without heart failure: Secondary | ICD-10-CM | POA: Insufficient documentation

## 2016-03-05 DIAGNOSIS — R0602 Shortness of breath: Secondary | ICD-10-CM | POA: Diagnosis not present

## 2016-03-05 LAB — EXERCISE TOLERANCE TEST
CHL RATE OF PERCEIVED EXERTION: 12
CSEPED: 5 min
CSEPHR: 87 %
Estimated workload: 7 METS
Exercise duration (sec): 1 s
MPHR: 174 {beats}/min
Peak HR: 153 {beats}/min
Rest HR: 92 {beats}/min

## 2016-03-10 ENCOUNTER — Telehealth: Payer: Self-pay

## 2016-03-10 NOTE — Telephone Encounter (Signed)
Can you call and get more info on her cough. Is she wanting to change BP medication?

## 2016-03-10 NOTE — Telephone Encounter (Signed)
Pt left v/m; pt was seen 01/28/16; pts cough has worsened; pt thinks related to amlodipine-benazepril. Pt request cb [facility].

## 2016-03-11 ENCOUNTER — Telehealth: Payer: Self-pay

## 2016-03-11 DIAGNOSIS — I10 Essential (primary) hypertension: Secondary | ICD-10-CM

## 2016-03-11 NOTE — Telephone Encounter (Signed)
Patient returned Melanie's call. Please call patient back at [phone].

## 2016-03-11 NOTE — Telephone Encounter (Signed)
Informed patient of results and verbal understanding expressed.  24 hour BP monitor ordered for scheduling. Patient agrees with treatment plan. 

## 2016-03-11 NOTE — Telephone Encounter (Signed)
-----   Message from Quintella Reichertraci R Turner, MD sent at 03/06/2016 12:59 PM EDT ----- No ischemia on stress test.  Please order a 24 hour Holter due to hypertensive BP on stress test

## 2016-03-11 NOTE — Telephone Encounter (Signed)
Left message on voicemail.

## 2016-03-12 NOTE — Telephone Encounter (Signed)
This sounds allergy related and I think it is coincidental that it worsened with the BP medication. Is she taking any Zyrtec or Flonase OTC?

## 2016-03-12 NOTE — Telephone Encounter (Signed)
Pt reports she is having a wet cough and productive at times clear sputum---assoc. Sx of facial pressure---pt reports the cough was intermittent in the beginning and worse at night---but pt reports she had a cough before starting medication but has increased in severity since starting BP meds... Please advise

## 2016-03-17 ENCOUNTER — Telehealth: Payer: Self-pay | Admitting: Cardiology

## 2016-03-17 NOTE — Telephone Encounter (Signed)
[name] is calling because her monitor keeps going off , she states that she has changed the battery and it's still going off and its's also irritating her skin . Please call   Thanks

## 2016-03-17 NOTE — Telephone Encounter (Signed)
Instructed patient to call the company of the monitor she has on. Informed patient that they can trouble shoot her issues, and they can also send her skin sensitive leads. Patient verbalized understanding.

## 2016-03-17 NOTE — Telephone Encounter (Signed)
Left detailed msg on VM per HIPAA for update on Sx

## 2016-03-18 NOTE — Telephone Encounter (Signed)
Patient said she started taking Zyrtec today and her cough is not as bad today.  It is a productive cough. Patient said if it gets worse, she'll call back.

## 2016-03-18 NOTE — Telephone Encounter (Signed)
noted 

## 2016-03-23 ENCOUNTER — Encounter: Payer: Self-pay | Admitting: Cardiology

## 2016-03-24 ENCOUNTER — Ambulatory Visit (INDEPENDENT_AMBULATORY_CARE_PROVIDER_SITE_OTHER): Payer: BC Managed Care – PPO

## 2016-03-24 DIAGNOSIS — I10 Essential (primary) hypertension: Secondary | ICD-10-CM | POA: Diagnosis not present

## 2016-03-26 NOTE — Telephone Encounter (Signed)
Patient's daughter,Aunyae,called.  Patient's cough has gotten worse.  She has been taking the Zyrtec every day. Please cal patient back after 3:30.

## 2016-03-26 NOTE — Telephone Encounter (Signed)
I recommend she make an appt to be seen

## 2016-03-27 NOTE — Telephone Encounter (Signed)
Spoke to pt and [facility]. States she has taken some OTC meds and has begin to feel better

## 2016-04-14 ENCOUNTER — Telehealth: Payer: Self-pay

## 2016-04-14 DIAGNOSIS — I471 Supraventricular tachycardia: Secondary | ICD-10-CM

## 2016-04-14 DIAGNOSIS — R000 Tachycardia, unspecified: Secondary | ICD-10-CM

## 2016-04-14 NOTE — Telephone Encounter (Signed)
24 hour holter ordered for scheduling. Patient agrees with treatment plan.

## 2016-04-14 NOTE — Telephone Encounter (Signed)
-----   Message from Quintella Reichertraci R Turner, MD sent at 04/06/2016  7:45 PM EDT ----- Please get a 24 hour HOlter to assess average heart rate

## 2016-04-16 ENCOUNTER — Telehealth: Payer: Self-pay

## 2016-04-16 MED ORDER — AMLODIPINE BESYLATE 10 MG PO TABS: 10.0000 mg | ORAL_TABLET | Freq: Every day | ORAL | 0 refills | Status: DC

## 2016-04-16 MED ORDER — HYDROCHLOROTHIAZIDE 12.5 MG PO TABS: 12.5000 mg | ORAL_TABLET | Freq: Every day | ORAL | 0 refills | Status: DC

## 2016-04-16 NOTE — Addendum Note (Signed)
Addended by: Roena MaladyEVONTENNO, Laney Louderback Y on: 04/16/2016 03:46 PM   Modules accepted: Orders

## 2016-04-16 NOTE — Telephone Encounter (Signed)
Pt left v/m; amlodipine-benazepril is causing a cough; pt said cough is worse and request different BP med to [facility].pt has been taking Zyrtec with no relief of cough. There is slight congestion but pt thinks cough still related to BP med. Pt request cb. Seen 01/28/16.

## 2016-04-16 NOTE — Telephone Encounter (Signed)
D/c Lotrel Send in Amlodipine 10 mg and HCTZ 12.5 mg daily and have her follow up in 2 weeks to recheck BP

## 2016-04-16 NOTE — Telephone Encounter (Signed)
Pt is aware and is agreeable to the change---Rx sent through e-scribe 2 wk OV scheduled

## 2016-04-28 ENCOUNTER — Encounter (INDEPENDENT_AMBULATORY_CARE_PROVIDER_SITE_OTHER): Payer: Self-pay

## 2016-04-28 ENCOUNTER — Ambulatory Visit (INDEPENDENT_AMBULATORY_CARE_PROVIDER_SITE_OTHER): Payer: BC Managed Care – PPO

## 2016-04-28 DIAGNOSIS — R000 Tachycardia, unspecified: Secondary | ICD-10-CM

## 2016-05-01 ENCOUNTER — Ambulatory Visit: Payer: BC Managed Care – PPO | Admitting: Internal Medicine

## 2016-05-04 ENCOUNTER — Ambulatory Visit (INDEPENDENT_AMBULATORY_CARE_PROVIDER_SITE_OTHER): Payer: BC Managed Care – PPO | Admitting: Internal Medicine

## 2016-05-04 ENCOUNTER — Encounter: Payer: Self-pay | Admitting: Internal Medicine

## 2016-05-04 VITALS — BP 120/86 | HR 96 | Temp 98.2°F | Wt 137.0 lb

## 2016-05-04 DIAGNOSIS — J329 Chronic sinusitis, unspecified: Secondary | ICD-10-CM

## 2016-05-04 DIAGNOSIS — I10 Essential (primary) hypertension: Secondary | ICD-10-CM | POA: Diagnosis not present

## 2016-05-04 DIAGNOSIS — B9789 Other viral agents as the cause of diseases classified elsewhere: Secondary | ICD-10-CM

## 2016-05-04 MED ORDER — HYDROCODONE-HOMATROPINE 5-1.5 MG/5ML PO SYRP
5.0000 mL | ORAL_SOLUTION | Freq: Three times a day (TID) | ORAL | 0 refills | Status: DC | PRN
Start: 2016-05-04 — End: 2016-05-27

## 2016-05-04 MED ORDER — HYDROCHLOROTHIAZIDE 25 MG PO TABS: 25.0000 mg | ORAL_TABLET | Freq: Every day | ORAL | 2 refills | Status: DC

## 2016-05-04 NOTE — Patient Instructions (Signed)

## 2016-05-04 NOTE — Progress Notes (Signed)
Subjective:    Patient ID: Jetty PeeksMichelle R Wesche, female    DOB: [birth year], 45 y.o.   MRN: [id number]  HPI  Pt presents to the clinic today to follow up HTN. She was changed from Amlodipine-Benazapril secondary to cough. She was started on Amlodipine and HCTZ. She has been taking the medication as directed and reports the cough has improved. Her BP today is 120/86.  She also c/o headache, facial pian and pressure, nasal congestion, sore throat and cough. This started 4-5 days ago. She is blowing green mucous out of her nose. She denies difficulty swallowing. The cough is productive of green mucous. She denies fever, chills or body aches. She has taken Tessalon and Coricidin with minimal relief. She does have a history of seasonal allergies. She has had sick contacts.  Review of Systems      Past Medical History:  Diagnosis Date  . Hypertension     Current Outpatient Prescriptions  Medication Sig Dispense Refill  . amLODipine (NORVASC) 10 MG tablet Take 1 tablet (10 mg total) by mouth daily. 30 tablet 0  . hydrochlorothiazide (HYDRODIURIL) 25 MG tablet Take 1 tablet (25 mg total) by mouth daily. 30 tablet 2  . HYDROcodone-homatropine (HYCODAN) 5-1.5 MG/5ML syrup Take 5 mLs by mouth every 8 (eight) hours as needed for cough. 120 mL 0   No current facility-administered medications for this visit.     No Known Allergies  Family History  Problem Relation Age of Onset  . Hypertension Mother   . Heart murmur Father   . Ovarian cancer Paternal Grandmother   . Lupus Sister     Social History   Social History  . Marital status: Married    Spouse name: N/A  . Number of children: N/A  . Years of education: N/A   Occupational History  . Not on file.   Social History Main Topics  . Smoking status: Light Tobacco Smoker    Packs/day: 0.30  . Smokeless tobacco: Never Used  . Alcohol use Yes     Comment: occasional   . Drug use: No  . Sexual activity: Yes    Birth control/  protection: Surgical   Other Topics Concern  . Not on file   Social History Narrative  . No narrative on file     Constitutional: Pt reports headache. Denies fever, malaise, fatigue, or abrupt weight changes.  HEENT: Pt reports facial pain and pressure, nasal congestion, sore throat. Denies eye pain, eye redness, ear pain, ringing in the ears, wax buildup, runny nose, bloody nose. Respiratory: Pt reports cough. Denies difficulty breathing, shortness of breath.   Cardiovascular: Denies chest pain, chest tightness, palpitations or swelling in the hands or feet.  Neurological: Denies dizziness, difficulty with memory, difficulty with speech or problems with balance and coordination.    No other specific complaints in a complete review of systems (except as listed in HPI above).  Objective:   Physical Exam  BP 120/86   Pulse 96   Temp 98.2 F (36.8 C) (Oral)   Wt 137 lb (62.1 kg)   SpO2 98%   BMI 23.89 kg/m  Wt Readings from Last 3 Encounters:  05/04/16 137 lb (62.1 kg)  02/18/16 133 lb 1.9 oz (60.4 kg)  01/28/16 134 lb (60.8 kg)    General: Appears her stated age, well developed, well nourished in NAD. HEENT: Head: normal shape and size, maxillary sinus tenderness noted; Eyes: sclera white, no icterus, conjunctiva pink; Ears: Tm's gray and intact, normal  light reflex; Nose: mucosa boggy and moist, septum midline; Throat/Mouth: Teeth present, mucosa erythematous and moist, no exudate, lesions or ulcerations noted.  Neck:  No adenopathy noted Cardiovascular: Normal rate and rhythm. S1,S2 noted.  No murmur, rubs or gallops noted.  Pulmonary/Chest: Normal effort and positive vesicular breath sounds. No respiratory distress. No wheezes, rales or ronchi noted.  Neurological: Alert and oriented.   BMET    Component Value Date/Time   NA 137 12/30/2015 1201   K 3.5 12/30/2015 1201   CL 102 12/30/2015 1201   CO2 28 12/30/2015 1201   GLUCOSE 94 12/30/2015 1201   BUN 11 12/30/2015  1201   CREATININE 0.63 12/30/2015 1201   CALCIUM 9.5 12/30/2015 1201   GFRNONAA >60 08/16/2015 1302   GFRAA >60 08/16/2015 1302    Lipid Panel  No results found for: CHOL, TRIG, HDL, CHOLHDL, VLDL, LDLCALC  CBC    Component Value Date/Time   WBC 11.6 (H) 12/30/2015 1201   RBC 4.58 12/30/2015 1201   HGB 14.0 12/30/2015 1201   HGB 12.3 05/10/2007 1305   HCT 43.0 12/30/2015 1201   HCT 35.8 05/10/2007 1305   PLT 397.0 12/30/2015 1201   PLT 515 (H) 05/10/2007 1305   MCV 93.7 12/30/2015 1201   MCV 82.8 05/10/2007 1305   MCH 30.9 08/16/2015 1302   MCHC 32.6 12/30/2015 1201   RDW 15.1 12/30/2015 1201   RDW 15.1 (H) 05/10/2007 1305   LYMPHSABS 2.4 09/09/2014 1054   LYMPHSABS 2.4 05/10/2007 1305   MONOABS 0.8 09/09/2014 1054   MONOABS 0.4 05/10/2007 1305   EOSABS 0.0 09/09/2014 1054   EOSABS 0.1 05/10/2007 1305   BASOSABS 0.0 09/09/2014 1054   BASOSABS 0.0 05/10/2007 1305    Hgb A1C No results found for: HGBA1C     Assessment & Plan:   Viral Sinusitis:  Advised her to start Flonase and Xyzal OTC RX for Hycodan for cough  HTN:  Will increase HCTZ to 25 mg daily Continue Amlodipine  RTC for your annual exam Nicki ReaperBAITY, Hilliary Jock, NP

## 2016-05-12 ENCOUNTER — Telehealth: Payer: Self-pay

## 2016-05-12 MED ORDER — METOPROLOL SUCCINATE ER 25 MG PO TB24: 25.0000 mg | ORAL_TABLET | Freq: Every day | ORAL | 11 refills | Status: DC

## 2016-05-12 NOTE — Telephone Encounter (Signed)
-----   Message from Quintella Reichertraci R Turner, MD sent at 05/10/2016 10:32 PM EST ----- Heart rate too fast overall.  Add Toprol XL 25mg  daily and had her followup in HTN clinic in 1 week.  Occasional PVCs noted which are benign

## 2016-05-12 NOTE — Telephone Encounter (Signed)
Informed patient of results and verbal understanding expressed.   Instructed patient to START TOPROL XL 25 mg daily. HTN Clinic OV scheduled 11/14. Patient agrees with treatment plan.

## 2016-05-14 ENCOUNTER — Other Ambulatory Visit: Payer: Self-pay | Admitting: Internal Medicine

## 2016-05-19 ENCOUNTER — Ambulatory Visit (INDEPENDENT_AMBULATORY_CARE_PROVIDER_SITE_OTHER): Payer: BC Managed Care – PPO | Admitting: Pharmacist

## 2016-05-19 VITALS — BP 120/82 | HR 83

## 2016-05-19 DIAGNOSIS — I10 Essential (primary) hypertension: Secondary | ICD-10-CM | POA: Diagnosis not present

## 2016-05-19 NOTE — Patient Instructions (Signed)
Continue current blood pressure medication as directed.   Follow-up with primary care provider and obstetrician concerning anxiety and pre-menopause.   Follow-up with Dr. Mayford Knifeurner to assess status of tachycardic episodes.   Call the clinic with any questions or concerns about your medication.

## 2016-05-19 NOTE — Progress Notes (Signed)
Patient ID: [name]                 DOB: [birth year]                      MRN: [id number]     HPI: [name] is a 45 y.o. female a patient of Dr. Mayford Knifeurner is referred to HTN clinic for medication management. PMH is significant for HTN, palpitations with occasional benign PVCs, and anxiety. Patient was on Lotrel for blood pressure control, but was changed to amlodipine and HCTZ due to ACE-induced cough. Her blood pressure has been controlled on this current regimen stabilizing in the 120s/80s mmHg. She was started on Toprol XL 25 mg daily on 11/07 to lower HR 2/2 holter monitor reporting sinus tachycardia.   Today pt reports feeling lightheaded to which she attributes to the Toprol XL. However, she has experienced multiple syncopal episodes in the past prior to starting Toprol XL. She endorses intermittent CP which happens with activity mostly and at rest, as well as dizziness which is resolved with sitting. She denies SOB and blurred vision. During our conversation, the patient reports that she experiences episodes of anxiety which has worsened over the past few years. She thinks she might be pre-menopausal as well. Her HR tends to go up significantly when she experiences these episodes and feels faint.   Current HTN meds: amlodipine 10 mg daily, HCTZ 25 daily, Toprol XL 25 mg daily Previously tried: amlodipine-benazapril 10-20 mg (cough) BP goal: <140/90 mmHg  Family History: HTN (mother), heart murmur (father), lupus (sister)  Social History: Smokes cigarettes, 0.3 PPD. Endorses occasional alcohol and denies illicit drug use.   Wt Readings from Last 3 Encounters:  05/04/16 137 lb (62.1 kg)  02/18/16 133 lb 1.9 oz (60.4 kg)  01/28/16 134 lb (60.8 kg)   BP Readings from Last 3 Encounters:  05/04/16 120/86  02/18/16 124/86  01/28/16 130/90   Pulse Readings from Last 3 Encounters:  05/04/16 96  02/18/16 96  01/28/16 98    Renal function: CrCl cannot be calculated  (Patient's most recent lab result is older than the maximum 21 days allowed.).  Past Medical History:  Diagnosis Date  . Hypertension     Current Outpatient Prescriptions on File Prior to Visit  Medication Sig Dispense Refill  . amLODipine (NORVASC) 10 MG tablet TAKE 1 TABLET(10 MG) BY MOUTH DAILY 30 tablet 5  . hydrochlorothiazide (HYDRODIURIL) 25 MG tablet Take 1 tablet (25 mg total) by mouth daily. 30 tablet 2  . HYDROcodone-homatropine (HYCODAN) 5-1.5 MG/5ML syrup Take 5 mLs by mouth every 8 (eight) hours as needed for cough. 120 mL 0  . metoprolol succinate (TOPROL XL) 25 MG 24 hr tablet Take 1 tablet (25 mg total) by mouth daily. 30 tablet 11  . [DISCONTINUED] ipratropium (ATROVENT) 0.06 % nasal spray Place 2 sprays into the nose 4 (four) times daily. (Patient not taking: Reported on 09/09/2014) 15 mL 12   No current facility-administered medications on file prior to visit.     No Known Allergies   Assessment/Plan:  1. Hypertension - Patient's blood pressure is still well-controlled and below the goal of <140/90 mmHg. Will continue with current medication regimen of amlodipine 10 mg daily, HCTZ 25 daily, and Toprol XL 25 mg daily. Patient's tachycardic episodes are likely exacerbated by anxiety. Advised her to follow-up with her PCP and OBGYN to assess anxiety and premenopausal symptoms. She will follow-up with Dr. Mayford Knifeurner as  needed for her tachycardia.   Patient seen by Adline PotterSabrina Dunham, [employer] student.   Megan E. Supple, PharmD, CPP [facility] [address], [place], [place] [address] Phone: [phone]; Fax: [phone] 05/19/2016 4:34 PM

## 2016-05-27 ENCOUNTER — Encounter: Payer: Self-pay | Admitting: Internal Medicine

## 2016-05-27 ENCOUNTER — Ambulatory Visit (INDEPENDENT_AMBULATORY_CARE_PROVIDER_SITE_OTHER): Payer: BC Managed Care – PPO | Admitting: Internal Medicine

## 2016-05-27 VITALS — BP 118/80 | HR 86 | Temp 98.0°F | Wt 134.5 lb

## 2016-05-27 DIAGNOSIS — F411 Generalized anxiety disorder: Secondary | ICD-10-CM | POA: Diagnosis not present

## 2016-05-27 DIAGNOSIS — R000 Tachycardia, unspecified: Secondary | ICD-10-CM | POA: Diagnosis not present

## 2016-05-27 DIAGNOSIS — N951 Menopausal and female climacteric states: Secondary | ICD-10-CM

## 2016-05-27 DIAGNOSIS — I10 Essential (primary) hypertension: Secondary | ICD-10-CM | POA: Diagnosis not present

## 2016-05-27 MED ORDER — VENLAFAXINE HCL ER 37.5 MG PO CP24: 37.5000 mg | ORAL_CAPSULE | Freq: Every day | ORAL | 1 refills | Status: DC

## 2016-05-27 NOTE — Patient Instructions (Signed)
Generalized Anxiety Disorder Generalized anxiety disorder (GAD) is a mental disorder. It interferes with life functions, including relationships, work, and school. GAD is different from normal anxiety, which everyone experiences at some point in their lives in response to specific life events and activities. Normal anxiety actually helps us prepare for and get through these life events and activities. Normal anxiety goes away after the event or activity is over.  GAD causes anxiety that is not necessarily related to specific events or activities. It also causes excess anxiety in proportion to specific events or activities. The anxiety associated with GAD is also difficult to control. GAD can vary from mild to severe. People with severe GAD can have intense waves of anxiety with physical symptoms (panic attacks).  SYMPTOMS The anxiety and worry associated with GAD are difficult to control. This anxiety and worry are related to many life events and activities and also occur more days than not for 6 months or longer. People with GAD also have three or more of the following symptoms (one or more in children):  Restlessness.   Fatigue.  Difficulty concentrating.   Irritability.  Muscle tension.  Difficulty sleeping or unsatisfying sleep. DIAGNOSIS GAD is diagnosed through an assessment by your health care provider. Your health care provider will ask you questions aboutyour mood,physical symptoms, and events in your life. Your health care provider may ask you about your medical history and use of alcohol or drugs, including prescription medicines. Your health care provider may also do a physical exam and blood tests. Certain medical conditions and the use of certain substances can cause symptoms similar to those associated with GAD. Your health care provider may refer you to a mental health specialist for further evaluation. TREATMENT The following therapies are usually used to treat GAD:    Medication. Antidepressant medication usually is prescribed for long-term daily control. Antianxiety medicines may be added in severe cases, especially when panic attacks occur.   Talk therapy (psychotherapy). Certain types of talk therapy can be helpful in treating GAD by providing support, education, and guidance. A form of talk therapy called cognitive behavioral therapy can teach you healthy ways to think about and react to daily life events and activities.  Stress managementtechniques. These include yoga, meditation, and exercise and can be very helpful when they are practiced regularly. A mental health specialist can help determine which treatment is best for you. Some people see improvement with one therapy. However, other people require a combination of therapies. This information is not intended to replace advice given to you by your health care provider. Make sure you discuss any questions you have with your health care provider. Document Released: 10/17/2012 Document Revised: 07/13/2014 Document Reviewed: 10/17/2012 Elsevier Interactive Patient Education  2017 Elsevier Inc.  

## 2016-05-27 NOTE — Progress Notes (Signed)
Subjective:    Patient ID: [name], female    DOB: 06-02-1971, 45 y.o.   MRN: [id number]  HPI  Pt presents to the clinic today to discuss anxiety. She reports she has been having symptoms of elevated blood pressure, tachycardia, chest pain, lightheadedness and dizziness. She has had a full workup done by cardiology, including ECG, echo, stress test. She is taking Norvasc, HCTZ and was started on Toprol by cardiology for her tachycardia. They felt like her symptoms listed above were likely a result of ongoing anxiety and premenopausal issues. She had her FSH/LH drawn 01/2016 which showed that she was not menopausal at that time. She reports she feels "foggy" and dizzy since starting the Metoprolol. She reports she is not sure what triggers her anxiety. She reports she has no tolerance, irritable and mood swings. She has never been treated for anxiety in the past. She denies depression, SI/HI.  Review of Systems      Past Medical History:  Diagnosis Date  . Hypertension     Current Outpatient Prescriptions  Medication Sig Dispense Refill  . amLODipine (NORVASC) 10 MG tablet TAKE 1 TABLET(10 MG) BY MOUTH DAILY 30 tablet 5  . hydrochlorothiazide (HYDRODIURIL) 25 MG tablet Take 1 tablet (25 mg total) by mouth daily. 30 tablet 2  . metoprolol succinate (TOPROL XL) 25 MG 24 hr tablet Take 1 tablet (25 mg total) by mouth daily. 30 tablet 11   No current facility-administered medications for this visit.     Allergies  Allergen Reactions  . Lotrel [Amlodipine Besy-Benazepril Hcl] Cough    Family History  Problem Relation Age of Onset  . Hypertension Mother   . Heart murmur Father   . Ovarian cancer Paternal Grandmother   . Lupus Sister     Social History   Social History  . Marital status: Married    Spouse name: N/A  . Number of children: N/A  . Years of education: N/A   Occupational History  . Not on file.   Social History Main Topics  . Smoking status: Light  Tobacco Smoker    Packs/day: 0.30  . Smokeless tobacco: Never Used  . Alcohol use Yes     Comment: occasional   . Drug use: No  . Sexual activity: Yes    Birth control/ protection: Surgical   Other Topics Concern  . Not on file   Social History Narrative  . No narrative on file     Constitutional: Denies fever, malaise, fatigue, headache or abrupt weight changes.  Respiratory: Denies difficulty breathing, shortness of breath, cough or sputum production.   Cardiovascular: Pt reports tachycardia. Denies chest pain, chest tightness, palpitations or swelling in the hands or feet.  Neurological: Pt reports dizziness. Denies difficulty with memory, difficulty with speech or problems with balance and coordination.  Psych: Pt reports anxiety. Denies depression, SI/HI.  No other specific complaints in a complete review of systems (except as listed in HPI above).  Objective:   Physical Exam  BP 118/80   Pulse 86   Temp 98 F (36.7 C) (Oral)   Wt 134 lb 8 oz (61 kg)   SpO2 98%   BMI 23.45 kg/m  Wt Readings from Last 3 Encounters:  05/27/16 134 lb 8 oz (61 kg)  05/04/16 137 lb (62.1 kg)  02/18/16 133 lb 1.9 oz (60.4 kg)    General: Appears her stated age, well developed, well nourished in NAD. Cardiovascular: Normal rate and rhythm. S1,S2 noted.  No  murmur, rubs or gallops noted. No JVD or BLE edema.  Pulmonary/Chest: Normal effort and positive vesicular breath sounds. No respiratory distress. No wheezes, rales or ronchi noted.  Neurological: Alert and oriented. Coordination normal.  Psychiatric: Mood and affect normal. Behavior is normal. Judgment and thought content normal.    BMET    Component Value Date/Time   NA 137 12/30/2015 1201   K 3.5 12/30/2015 1201   CL 102 12/30/2015 1201   CO2 28 12/30/2015 1201   GLUCOSE 94 12/30/2015 1201   BUN 11 12/30/2015 1201   CREATININE 0.63 12/30/2015 1201   CALCIUM 9.5 12/30/2015 1201   GFRNONAA >60 08/16/2015 1302   GFRAA >60  08/16/2015 1302    Lipid Panel  No results found for: CHOL, TRIG, HDL, CHOLHDL, VLDL, LDLCALC  CBC    Component Value Date/Time   WBC 11.6 (H) 12/30/2015 1201   RBC 4.58 12/30/2015 1201   HGB 14.0 12/30/2015 1201   HGB 12.3 05/10/2007 1305   HCT 43.0 12/30/2015 1201   HCT 35.8 05/10/2007 1305   PLT 397.0 12/30/2015 1201   PLT 515 (H) 05/10/2007 1305   MCV 93.7 12/30/2015 1201   MCV 82.8 05/10/2007 1305   MCH 30.9 08/16/2015 1302   MCHC 32.6 12/30/2015 1201   RDW 15.1 12/30/2015 1201   RDW 15.1 (H) 05/10/2007 1305   LYMPHSABS 2.4 09/09/2014 1054   LYMPHSABS 2.4 05/10/2007 1305   MONOABS 0.8 09/09/2014 1054   MONOABS 0.4 05/10/2007 1305   EOSABS 0.0 09/09/2014 1054   EOSABS 0.1 05/10/2007 1305   BASOSABS 0.0 09/09/2014 1054   BASOSABS 0.0 05/10/2007 1305    Hgb A1C No results found for: HGBA1C          Assessment & Plan:   Tachycardia secondary to anxiety and perimenopause:  Support offered today Discussed treatment options Will start Effexor 37.5 mg daily (will hold Metoprolol for now) Continue Amlodipine and HCTZ for now She will update me in 3 weeks to follow up on this  RTC in 3 months for your annual exam Nicki ReaperBAITY, Eupha Lobb, NP

## 2016-06-01 ENCOUNTER — Telehealth: Payer: Self-pay | Admitting: Internal Medicine

## 2016-06-01 NOTE — Telephone Encounter (Signed)
Take 1 tablet every other day x 1 week, then every 3 days x 1 week then stop

## 2016-06-01 NOTE — Telephone Encounter (Signed)
When patient went to pick up the venlafaxine XR (EFFEXOR-XR) 37.5 MG 24 hr capsule  , she was told by pharmacist that she could not "cold [place]" stop toprol medication, that she needed to be weaned off.  She would like to know how to proceed.  Please call pt at [phone]

## 2016-06-02 ENCOUNTER — Telehealth: Payer: Self-pay | Admitting: Cardiology

## 2016-06-02 NOTE — Telephone Encounter (Signed)
New message  Pt wants to know if she is to continue heart medication since she is now on anti-anxiety meds  Please call back to advise

## 2016-06-02 NOTE — Telephone Encounter (Signed)
Pt is aware as instructed 

## 2016-06-03 ENCOUNTER — Encounter: Payer: Self-pay | Admitting: Cardiology

## 2016-06-03 NOTE — Telephone Encounter (Signed)
Left message to call back  

## 2016-06-03 NOTE — Telephone Encounter (Signed)
The patient stated she started Effexor 37.5 mg daily yesterday by her PCP. She is weaning herself of Toprol. She will take a tablet every other day for a few days and notify when she is done to update medication list. She was instructed by her PCP to call and make sure the Effexor is OK to take with her other cardiac medications.  To PharmD.

## 2016-06-03 NOTE — Telephone Encounter (Signed)
New message ° °Pt is returning call  ° °Please call back °

## 2016-06-03 NOTE — Telephone Encounter (Signed)
This encounter was created in error - please disregard.

## 2016-06-04 NOTE — Telephone Encounter (Signed)
Confirmed with patient she may take Effexor with her meds. She was grateful for follow-up.

## 2016-06-04 NOTE — Telephone Encounter (Signed)
Yes - Effexor is fine to take with her other cardiac meds.

## 2016-07-09 ENCOUNTER — Encounter: Payer: BC Managed Care – PPO | Admitting: Internal Medicine

## 2016-07-09 NOTE — Progress Notes (Deleted)
   Subjective:    Patient ID: [name], female    DOB: 1971/05/19, 46 y.o.   MRN: [id number]  HPI  Pt presents to the clinic today for her annual exam.  Flu: Tetanus: Pap Smear: Mammogram: 04/2012 Vision Screening: Dentist:  Diet: Exercise:  Review of Systems      Past Medical History:  Diagnosis Date  . Hypertension     Current Outpatient Prescriptions  Medication Sig Dispense Refill  . amLODipine (NORVASC) 10 MG tablet TAKE 1 TABLET(10 MG) BY MOUTH DAILY 30 tablet 5  . hydrochlorothiazide (HYDRODIURIL) 25 MG tablet Take 1 tablet (25 mg total) by mouth daily. 30 tablet 2  . metoprolol succinate (TOPROL XL) 25 MG 24 hr tablet Take 1 tablet (25 mg total) by mouth daily. 30 tablet 11  . venlafaxine XR (EFFEXOR-XR) 37.5 MG 24 hr capsule Take 1 capsule (37.5 mg total) by mouth daily with breakfast. 30 capsule 1   No current facility-administered medications for this visit.     Allergies  Allergen Reactions  . Lotrel [Amlodipine Besy-Benazepril Hcl] Cough    Family History  Problem Relation Age of Onset  . Hypertension Mother   . Heart murmur Father   . Ovarian cancer Paternal Grandmother   . Lupus Sister     Social History   Social History  . Marital status: Married    Spouse name: N/A  . Number of children: N/A  . Years of education: N/A   Occupational History  . Not on file.   Social History Main Topics  . Smoking status: Light Tobacco Smoker    Packs/day: 0.30  . Smokeless tobacco: Never Used  . Alcohol use Yes     Comment: occasional   . Drug use: No  . Sexual activity: Yes    Birth control/ protection: Surgical   Other Topics Concern  . Not on file   Social History Narrative  . No narrative on file     Constitutional: Denies fever, malaise, fatigue, headache or abrupt weight changes.  HEENT: Denies eye pain, eye redness, ear pain, ringing in the ears, wax buildup, runny nose, nasal congestion, bloody nose, or sore  throat. Respiratory: Denies difficulty breathing, shortness of breath, cough or sputum production.   Cardiovascular: Denies chest pain, chest tightness, palpitations or swelling in the hands or feet.  Gastrointestinal: Denies abdominal pain, bloating, constipation, diarrhea or blood in the stool.  GU: Denies urgency, frequency, pain with urination, burning sensation, blood in urine, odor or discharge. Musculoskeletal: Denies decrease in range of motion, difficulty with gait, muscle pain or joint pain and swelling.  Skin: Denies redness, rashes, lesions or ulcercations.  Neurological: Denies dizziness, difficulty with memory, difficulty with speech or problems with balance and coordination.  Psych: Denies anxiety, depression, SI/HI.  No other specific complaints in a complete review of systems (except as listed in HPI above).  Objective:   Physical Exam        Assessment & Plan:

## 2016-07-20 ENCOUNTER — Ambulatory Visit (INDEPENDENT_AMBULATORY_CARE_PROVIDER_SITE_OTHER): Payer: BC Managed Care – PPO | Admitting: Cardiology

## 2016-07-20 ENCOUNTER — Encounter: Payer: Self-pay | Admitting: Cardiology

## 2016-07-20 ENCOUNTER — Telehealth: Payer: Self-pay

## 2016-07-20 ENCOUNTER — Encounter: Payer: Self-pay | Admitting: Internal Medicine

## 2016-07-20 ENCOUNTER — Ambulatory Visit (INDEPENDENT_AMBULATORY_CARE_PROVIDER_SITE_OTHER): Payer: BC Managed Care – PPO | Admitting: Internal Medicine

## 2016-07-20 ENCOUNTER — Encounter (INDEPENDENT_AMBULATORY_CARE_PROVIDER_SITE_OTHER): Payer: Self-pay

## 2016-07-20 VITALS — BP 120/76 | HR 74 | Temp 98.0°F | Ht 64.0 in | Wt 127.0 lb

## 2016-07-20 VITALS — BP 120/64 | HR 68 | Ht 63.5 in | Wt 128.0 lb

## 2016-07-20 DIAGNOSIS — Z0000 Encounter for general adult medical examination without abnormal findings: Secondary | ICD-10-CM | POA: Diagnosis not present

## 2016-07-20 DIAGNOSIS — Z1231 Encounter for screening mammogram for malignant neoplasm of breast: Secondary | ICD-10-CM | POA: Diagnosis not present

## 2016-07-20 DIAGNOSIS — I493 Ventricular premature depolarization: Secondary | ICD-10-CM | POA: Insufficient documentation

## 2016-07-20 DIAGNOSIS — R000 Tachycardia, unspecified: Secondary | ICD-10-CM | POA: Insufficient documentation

## 2016-07-20 DIAGNOSIS — E876 Hypokalemia: Secondary | ICD-10-CM | POA: Diagnosis not present

## 2016-07-20 DIAGNOSIS — I10 Essential (primary) hypertension: Secondary | ICD-10-CM | POA: Diagnosis not present

## 2016-07-20 DIAGNOSIS — Z1239 Encounter for other screening for malignant neoplasm of breast: Secondary | ICD-10-CM

## 2016-07-20 DIAGNOSIS — E785 Hyperlipidemia, unspecified: Secondary | ICD-10-CM

## 2016-07-20 HISTORY — DX: Ventricular premature depolarization: I49.3

## 2016-07-20 HISTORY — DX: Tachycardia, unspecified: R00.0

## 2016-07-20 LAB — COMPREHENSIVE METABOLIC PANEL
ALT: 18 U/L (ref 0–35)
AST: 16 U/L (ref 0–37)
Albumin: 4.5 g/dL (ref 3.5–5.2)
Alkaline Phosphatase: 62 U/L (ref 39–117)
BUN: 12 mg/dL (ref 6–23)
CALCIUM: 9.8 mg/dL (ref 8.4–10.5)
CO2: 32 meq/L (ref 19–32)
Chloride: 98 mEq/L (ref 96–112)
Creatinine, Ser: 0.58 mg/dL (ref 0.40–1.20)
GFR: 143.57 mL/min (ref >60.00)
Glucose, Bld: 80 mg/dL (ref 70–99)
Potassium: 2.7 mEq/L — CL (ref 3.5–5.1)
Sodium: 137 mEq/L (ref 135–145)
Total Bilirubin: 0.3 mg/dL (ref 0.2–1.2)
Total Protein: 8.1 g/dL (ref 6.0–8.3)

## 2016-07-20 LAB — CBC
HCT: 37.2 % (ref 36.0–46.0)
HEMOGLOBIN: 12.8 g/dL (ref 12.0–15.0)
MCHC: 34.4 g/dL (ref 30.0–36.0)
MCV: 87.8 fl (ref 78.0–100.0)
PLATELETS: 507 10*3/uL — AB (ref 150.0–400.0)
RBC: 4.23 Mil/uL (ref 3.87–5.11)
RDW: 13.6 % (ref 11.5–15.5)
WBC: 10.8 10*3/uL — ABNORMAL HIGH (ref 4.0–10.5)

## 2016-07-20 LAB — LIPID PANEL
CHOL/HDL RATIO: 4
Cholesterol: 226 mg/dL — ABNORMAL HIGH (ref 0–200)
HDL: 50.8 mg/dL (ref >39.00)
LDL CALC: 149 mg/dL — AB (ref 0–99)
NonHDL: 174.84
TRIGLYCERIDES: 129 mg/dL (ref 0.0–149.0)
VLDL: 25.8 mg/dL (ref 0.0–40.0)

## 2016-07-20 MED ORDER — HYDROCODONE-HOMATROPINE 5-1.5 MG/5ML PO SYRP: 5.0000 mL | ORAL_SOLUTION | Freq: Three times a day (TID) | ORAL | 0 refills | Status: DC | PRN

## 2016-07-20 NOTE — Telephone Encounter (Addendum)
[facility] called with critical result on potassium. Potassium is 2.7.  Melanie, CMA made aware of result.

## 2016-07-20 NOTE — Progress Notes (Signed)
Cardiology Office Note    Date:  07/20/2016   ID:  Melissa PeeksMichelle R Nill, DOB 08-04-1970, MRN [id number]  PCP:  Nicki ReaperBAITY, REGINA, NP  Cardiologist:  Armanda Magicraci Anika Shore, MD   Chief Complaint  Patient presents with  . Follow-up    PVCs, HTN, sinus tachycardia    History of Present Illness:  [name] is a 46 y.o. female with a history of HTN and  palpitations.  She wore a heart monitor showing sinus tachycardia and occasional PVCs and was started on Toprol.  2D echo showed normal LVF with Basal septal hypertrophy and ETT showed no ischemia. She saw her PCP because there was some concern that her tachycardia could be related to anxiety and perimenopausal state.  She was started on Effexor and weaned herself off the BB.  She now is here for followup.  She is doing well.  She denies any chest pain, SOB, DOE, LE edema, dizziness or syncope. Occasionally she will feel her heart race some with some skipping but only lasts a few seconds.  She drinks a lot of tea all day so I have encouraged her to cut back on that.   Past Medical History:  Diagnosis Date  . Hypertension   . PVC (premature ventricular contraction) 07/20/2016  . Sinus tachycardia 07/20/2016    Past Surgical History:  Procedure Laterality Date  . ABDOMINAL HYSTERECTOMY    . TUBAL LIGATION      Current Medications: Outpatient Medications Prior to Visit  Medication Sig Dispense Refill  . amLODipine (NORVASC) 10 MG tablet TAKE 1 TABLET(10 MG) BY MOUTH DAILY 30 tablet 5  . hydrochlorothiazide (HYDRODIURIL) 25 MG tablet Take 1 tablet (25 mg total) by mouth daily. 30 tablet 2  . venlafaxine XR (EFFEXOR-XR) 37.5 MG 24 hr capsule Take 1 capsule (37.5 mg total) by mouth daily with breakfast. 30 capsule 1   No facility-administered medications prior to visit.      Allergies:   Lotrel [amlodipine besy-benazepril hcl]   Social History   Social History  . Marital status: Married    Spouse name: N/A  . Number of children: N/A  .  Years of education: N/A   Social History Main Topics  . Smoking status: Light Tobacco Smoker    Packs/day: 0.30  . Smokeless tobacco: Never Used  . Alcohol use Yes     Comment: occasional   . Drug use: No  . Sexual activity: Yes    Birth control/ protection: Surgical   Other Topics Concern  . Not on file   Social History Narrative  . No narrative on file     Family History:  The patient's family history includes Heart murmur in her father; Hypertension in her mother; Lupus in her sister; Ovarian cancer in her paternal grandmother.   ROS:   Please see the history of present illness.    ROS All other systems reviewed and are negative.  No flowsheet data found.     PHYSICAL EXAM:   VS:  BP 120/64   Pulse 68   Ht 5' 3.5" (1.613 m)   Wt 128 lb (58.1 kg)   BMI 22.32 kg/m    GEN: Well nourished, well developed, in no acute distress  HEENT: normal  Neck: no JVD, carotid bruits, or masses Cardiac: RRR; no murmurs, rubs, or gallops,no edema.  Intact distal pulses bilaterally.  Respiratory:  clear to auscultation bilaterally, normal work of breathing GI: soft, nontender, nondistended, + BS MS: no deformity or atrophy  Skin: warm and dry, no rash Neuro:  Alert and Oriented x 3, Strength and sensation are intact Psych: euthymic mood, full affect  Wt Readings from Last 3 Encounters:  07/20/16 128 lb (58.1 kg)  05/27/16 134 lb 8 oz (61 kg)  05/04/16 137 lb (62.1 kg)      Studies/Labs Reviewed:   EKG:  EKG is not ordered today.    Recent Labs: 12/30/2015: ALT 22; BUN 11; Creatinine, Ser 0.63; Hemoglobin 14.0; Magnesium 2.0; Platelets 397.0; Potassium 3.5; Sodium 137; TSH 0.93   Lipid Panel No results found for: CHOL, TRIG, HDL, CHOLHDL, VLDL, LDLCALC, LDLDIRECT  Additional studies/ records that were reviewed today include:  none    ASSESSMENT:    1. HYPERTENSION, BENIGN ESSENTIAL   2. PVC (premature ventricular contraction)   3. Sinus tachycardia       PLAN:  In order of problems listed above:  1. HTN - BP controlled on diuretic and amlodipine. 2. PVC's - asymptomatic for the most part and I encouraged her to cut back on her caffeine intake and limit alcohol intake to 1 glass.  3. Sinus tachycardia - resolved after going on Effexor and most likely due to anxiety.    Medication Adjustments/Labs and Tests Ordered: Current medicines are reviewed at length with the patient today.  Concerns regarding medicines are outlined above.  Medication changes, Labs and Tests ordered today are listed in the Patient Instructions below.  There are no Patient Instructions on file for this visit.   Signed, Armanda Magic, MD  07/20/2016 9:41 AM    [facility] [address] [place], [place], [place]  [address] Phone: [phone]; Fax: [phone]

## 2016-07-20 NOTE — Addendum Note (Signed)
Addended by: BAITY, REGINA W on: 07/20/2016 02:06 PM   Modules accepted: Orders  

## 2016-07-20 NOTE — Progress Notes (Signed)
Subjective:    Patient ID: [name], female    DOB: January 27, 1971, 46 y.o.   MRN: [id number]  HPI  Pt presents to the clinic today for her annual exam.  Flu: 05/2016 Tetanus: 2009 at [facility] Department Pap Smear: ? 2012, partial hysterectomy Mammogram: 05/2015, [facility]: every 2 years Dentist: binnually  Diet: She does eat meat. She consumes veggies daily, fruits some days. She does eat fried food. She drinks mostly water. Exercise: She has been doing the "booty challenge", squats and lunges daily.  Review of Systems      Past Medical History:  Diagnosis Date  . Hypertension   . PVC (premature ventricular contraction) 07/20/2016  . Sinus tachycardia 07/20/2016    Current Outpatient Prescriptions  Medication Sig Dispense Refill  . amLODipine (NORVASC) 10 MG tablet TAKE 1 TABLET(10 MG) BY MOUTH DAILY 30 tablet 5  . hydrochlorothiazide (HYDRODIURIL) 25 MG tablet Take 1 tablet (25 mg total) by mouth daily. 30 tablet 2  . venlafaxine XR (EFFEXOR-XR) 37.5 MG 24 hr capsule Take 1 capsule (37.5 mg total) by mouth daily with breakfast. 30 capsule 1   No current facility-administered medications for this visit.     Allergies  Allergen Reactions  . Lotrel [Amlodipine Besy-Benazepril Hcl] Cough    Family History  Problem Relation Age of Onset  . Hypertension Mother   . Heart murmur Father   . Ovarian cancer Paternal Grandmother   . Lupus Sister     Social History   Social History  . Marital status: Married    Spouse name: N/A  . Number of children: N/A  . Years of education: N/A   Occupational History  . Not on file.   Social History Main Topics  . Smoking status: Light Tobacco Smoker    Packs/day: 0.30  . Smokeless tobacco: Never Used  . Alcohol use Yes     Comment: occasional   . Drug use: No  . Sexual activity: Yes    Birth control/ protection: Surgical   Other Topics Concern  . Not on file   Social History Narrative    . No narrative on file     Constitutional: Denies fever, malaise, fatigue, headache or abrupt weight changes.  HEENT: Denies eye pain, eye redness, ear pain, ringing in the ears, wax buildup, runny nose, nasal congestion, bloody nose, or sore throat. Respiratory: Denies difficulty breathing, shortness of breath, cough or sputum production.   Cardiovascular: Pt reports intermittent palpitations (ongoing issue, has had a full cardiac workup). Denies chest pain, chest tightness, palpitations or swelling in the hands or feet.  Gastrointestinal: Pt reports reflux. Denies abdominal pain, bloating, constipation, diarrhea or blood in the stool.  GU: Denies urgency, frequency, pain with urination, burning sensation, blood in urine, odor or discharge. Musculoskeletal: Denies decrease in range of motion, difficulty with gait, muscle pain or joint pain and swelling.  Skin: Denies redness, rashes, lesions or ulcercations.  Neurological: Denies dizziness, difficulty with memory, difficulty with speech or problems with balance and coordination.  Psych: Pt reports mild anxiety. Denies depression, SI/HI.  No other specific complaints in a complete review of systems (except as listed in HPI above).  Objective:   Physical Exam   BP 120/76   Pulse 74   Temp 98 F (36.7 C) (Oral)   Ht 5\' 4"  (1.626 m)   Wt 127 lb (57.6 kg)   SpO2 99%   BMI 21.80 kg/m  Wt Readings from Last 3 Encounters:  07/20/16 127 lb (57.6 kg)  07/20/16 128 lb (58.1 kg)  05/27/16 134 lb 8 oz (61 kg)    General: Appears her stated age, well developed, well nourished in NAD. Skin: Warm, dry and intact.  HEENT: Head: normal shape and size; Eyes: sclera white, no icterus, conjunctiva pink, PERRLA and EOMs intact; Ears: bilateral cerumen impaction; Throat/Mouth: Teeth present, mucosa pink and moist, no exudate, lesions or ulcerations noted.  Neck:  Neck supple, trachea midline. No masses, lumps or thyromegaly present.   Cardiovascular: Normal rate and rhythm. S1,S2 noted.  No murmur, rubs or gallops noted. No JVD or BLE edema. No carotid bruits noted. Pulmonary/Chest: Normal effort and positive vesicular breath sounds. No respiratory distress. No wheezes, rales or ronchi noted.  Abdomen: Soft and nontender. Normal bowel sounds. No distention or masses noted. Liver, spleen and kidneys non palpable. Pelvic: Normal female anatomy. Adnexa non palpable.  Musculoskeletal: Normal range of motion. Strength 5/5 BUE/BLE. No difficulty with gait.  Neurological: Alert and oriented. Cranial nerves II-XII grossly intact. Coordination normal.  Psychiatric: Mood and affect normal. Behavior is normal. Judgment and thought content normal.    BMET    Component Value Date/Time   NA 137 12/30/2015 1201   K 3.5 12/30/2015 1201   CL 102 12/30/2015 1201   CO2 28 12/30/2015 1201   GLUCOSE 94 12/30/2015 1201   BUN 11 12/30/2015 1201   CREATININE 0.63 12/30/2015 1201   CALCIUM 9.5 12/30/2015 1201   GFRNONAA >60 08/16/2015 1302   GFRAA >60 08/16/2015 1302    Lipid Panel  No results found for: CHOL, TRIG, HDL, CHOLHDL, VLDL, LDLCALC  CBC    Component Value Date/Time   WBC 11.6 (H) 12/30/2015 1201   RBC 4.58 12/30/2015 1201   HGB 14.0 12/30/2015 1201   HGB 12.3 05/10/2007 1305   HCT 43.0 12/30/2015 1201   HCT 35.8 05/10/2007 1305   PLT 397.0 12/30/2015 1201   PLT 515 (H) 05/10/2007 1305   MCV 93.7 12/30/2015 1201   MCV 82.8 05/10/2007 1305   MCH 30.9 08/16/2015 1302   MCHC 32.6 12/30/2015 1201   RDW 15.1 12/30/2015 1201   RDW 15.1 (H) 05/10/2007 1305   LYMPHSABS 2.4 09/09/2014 1054   LYMPHSABS 2.4 05/10/2007 1305   MONOABS 0.8 09/09/2014 1054   MONOABS 0.4 05/10/2007 1305   EOSABS 0.0 09/09/2014 1054   EOSABS 0.1 05/10/2007 1305   BASOSABS 0.0 09/09/2014 1054   BASOSABS 0.0 05/10/2007 1305    Hgb A1C No results found for: HGBA1C         Assessment & Plan:   Preventative Health Maintenance:  Flu  and tetanus UTD Pelvic exam today Mammogram ordered- she will [facility] to schedule- number provided Encouraged her to consume a balanced diet and exercise regimen Advised her to see an eye doctor and dentist annually Will check CBC, CMET, Lipid profile today  RTC in 1 year, sooner if needed Nicki ReaperBAITY, REGINA, NP

## 2016-07-20 NOTE — Patient Instructions (Signed)

## 2016-07-20 NOTE — Patient Instructions (Signed)
Medication Instructions:  Your physician recommends that you continue on your current medications as directed. Please refer to the Current Medication list given to you today.  Labwork: NONE  Testing/Procedures: NONE  Follow-Up: AS NEEDED AT THIS TIME  Any Other Special Instructions Will Be Listed Below (If Applicable).  If you need a refill on your cardiac medications before your next appointment, please call your pharmacy. 

## 2016-07-21 MED ORDER — POTASSIUM CHLORIDE ER 10 MEQ PO TBCR: 10.0000 meq | EXTENDED_RELEASE_TABLET | Freq: Two times a day (BID) | ORAL | 0 refills | Status: DC

## 2016-07-21 MED ORDER — ATORVASTATIN CALCIUM 10 MG PO TABS: 10.0000 mg | ORAL_TABLET | Freq: Every day | ORAL | 2 refills | Status: DC

## 2016-07-21 NOTE — Telephone Encounter (Signed)
Noted, will address through result note

## 2016-07-21 NOTE — Addendum Note (Signed)
Addended by: Roena MaladyEVONTENNO, Merek Niu Y on: 07/21/2016 05:27 PM   Modules accepted: Orders

## 2016-07-21 NOTE — Addendum Note (Signed)
Addended by: Lorre MunroeBAITY, REGINA W on: 07/21/2016 09:46 AM   Modules accepted: Orders

## 2016-07-23 ENCOUNTER — Ambulatory Visit: Payer: BC Managed Care – PPO | Admitting: Cardiology

## 2016-07-31 ENCOUNTER — Other Ambulatory Visit: Payer: Self-pay | Admitting: Internal Medicine

## 2016-08-01 NOTE — Telephone Encounter (Signed)
Please advise if okay to continue--last filled 05/27/16 with 1 refill

## 2016-08-03 ENCOUNTER — Other Ambulatory Visit: Payer: Self-pay | Admitting: Internal Medicine

## 2016-08-03 DIAGNOSIS — I10 Essential (primary) hypertension: Secondary | ICD-10-CM

## 2016-08-05 ENCOUNTER — Other Ambulatory Visit: Payer: BC Managed Care – PPO

## 2016-08-06 ENCOUNTER — Other Ambulatory Visit: Payer: BC Managed Care – PPO

## 2016-08-11 ENCOUNTER — Other Ambulatory Visit: Payer: BC Managed Care – PPO

## 2016-08-21 ENCOUNTER — Other Ambulatory Visit: Payer: Self-pay | Admitting: Internal Medicine

## 2016-08-26 ENCOUNTER — Other Ambulatory Visit: Payer: BC Managed Care – PPO

## 2016-08-28 ENCOUNTER — Encounter (INDEPENDENT_AMBULATORY_CARE_PROVIDER_SITE_OTHER): Payer: Self-pay

## 2016-08-28 ENCOUNTER — Other Ambulatory Visit (INDEPENDENT_AMBULATORY_CARE_PROVIDER_SITE_OTHER): Payer: BC Managed Care – PPO

## 2016-08-28 DIAGNOSIS — E785 Hyperlipidemia, unspecified: Secondary | ICD-10-CM | POA: Diagnosis not present

## 2016-08-28 DIAGNOSIS — E876 Hypokalemia: Secondary | ICD-10-CM

## 2016-08-28 LAB — LIPID PANEL
CHOL/HDL RATIO: 3
Cholesterol: 154 mg/dL (ref 0–200)
HDL: 56.6 mg/dL (ref >39.00)
LDL CALC: 75 mg/dL (ref 0–99)
NONHDL: 97.22
Triglycerides: 112 mg/dL (ref 0.0–149.0)
VLDL: 22.4 mg/dL (ref 0.0–40.0)

## 2016-08-28 LAB — POTASSIUM: Potassium: 3.5 mEq/L (ref 3.5–5.1)

## 2016-08-30 ENCOUNTER — Other Ambulatory Visit: Payer: Self-pay | Admitting: Internal Medicine

## 2016-08-31 NOTE — Telephone Encounter (Signed)
Last filled 08/03/16--please advise if okay for pt to continue

## 2016-09-07 ENCOUNTER — Other Ambulatory Visit: Payer: Self-pay | Admitting: Internal Medicine

## 2016-09-07 DIAGNOSIS — I10 Essential (primary) hypertension: Secondary | ICD-10-CM

## 2016-09-23 ENCOUNTER — Ambulatory Visit: Payer: BC Managed Care – PPO | Admitting: Family Medicine

## 2016-09-23 ENCOUNTER — Ambulatory Visit (INDEPENDENT_AMBULATORY_CARE_PROVIDER_SITE_OTHER): Payer: BC Managed Care – PPO | Admitting: Family Medicine

## 2016-09-23 ENCOUNTER — Encounter: Payer: Self-pay | Admitting: Family Medicine

## 2016-09-23 VITALS — BP 122/76 | HR 96 | Temp 98.2°F | Ht 64.0 in | Wt 128.0 lb

## 2016-09-23 DIAGNOSIS — J069 Acute upper respiratory infection, unspecified: Secondary | ICD-10-CM

## 2016-09-23 MED ORDER — HYDROCODONE-HOMATROPINE 5-1.5 MG/5ML PO SYRP: 5.0000 mL | ORAL_SOLUTION | Freq: Three times a day (TID) | ORAL | 0 refills | Status: DC | PRN

## 2016-09-23 NOTE — Progress Notes (Signed)
SUBJECTIVE:  [name] is a 46 y.o. female who complains of coryza, congestion and productive cough for 5 days. She denies a history of anorexia and chest pain and denies a history of asthma. Patient admits to smoke cigarettes.   Current Outpatient Prescriptions on File Prior to Visit  Medication Sig Dispense Refill  . amLODipine (NORVASC) 10 MG tablet TAKE 1 TABLET(10 MG) BY MOUTH DAILY 30 tablet 5  . atorvastatin (LIPITOR) 10 MG tablet Take 1 tablet (10 mg total) by mouth daily. 30 tablet 2  . hydrochlorothiazide (HYDRODIURIL) 25 MG tablet TAKE 1 TABLET(25 MG) BY MOUTH DAILY 30 tablet 8  . potassium chloride (K-DUR) 10 MEQ tablet TAKE 1 TABLET(10 MEQ) BY MOUTH TWICE DAILY 60 tablet 5  . venlafaxine XR (EFFEXOR-XR) 37.5 MG 24 hr capsule TAKE 1 CAPSULE(37.5 MG) BY MOUTH DAILY WITH BREAKFAST 30 capsule 3  . [DISCONTINUED] ipratropium (ATROVENT) 0.06 % nasal spray Place 2 sprays into the nose 4 (four) times daily. (Patient not taking: Reported on 09/09/2014) 15 mL 12   No current facility-administered medications on file prior to visit.     Allergies  Allergen Reactions  . Lotrel [Amlodipine Besy-Benazepril Hcl] Cough    Past Medical History:  Diagnosis Date  . Hypertension   . PVC (premature ventricular contraction) 07/20/2016  . Sinus tachycardia 07/20/2016    Past Surgical History:  Procedure Laterality Date  . ABDOMINAL HYSTERECTOMY    . TUBAL LIGATION      Family History  Problem Relation Age of Onset  . Hypertension Mother   . Heart murmur Father   . Ovarian cancer Paternal Grandmother   . Lupus Sister     Social History   Social History  . Marital status: Married    Spouse name: N/A  . Number of children: N/A  . Years of education: N/A   Occupational History  . Not on file.   Social History Main Topics  . Smoking status: Light Tobacco Smoker    Packs/day: 0.30  . Smokeless tobacco: Never Used  . Alcohol use Yes     Comment: occasional   . Drug use:  No  . Sexual activity: Yes    Birth control/ protection: Surgical   Other Topics Concern  . Not on file   Social History Narrative  . No narrative on file   The PMH, PSH, Social History, Family History, Medications, and allergies [facility], and have been updated if relevant.  OBJECTIVE: BP 122/76   Pulse 96   Temp 98.2 F (36.8 C)   Ht 5\' 4"  (1.626 m)   Wt 128 lb (58.1 kg)   SpO2 96%   BMI 21.97 kg/m   She appears well, vital signs are as noted. Ears normal.  Throat and pharynx normal.  Neck supple. No adenopathy in the neck. Nose is congested. Sinuses non tender. The chest is clear, without wheezes or rales.  ASSESSMENT:  viral upper respiratory illness  PLAN: Symptomatic therapy suggested: push fluids, rest and return office visit prn if symptoms persist or worsen. Lack of antibiotic effectiveness discussed with her. Call or return to clinic prn if these symptoms worsen or fail to improve as anticipated.

## 2016-09-28 ENCOUNTER — Other Ambulatory Visit: Payer: Self-pay

## 2016-09-28 ENCOUNTER — Telehealth: Payer: Self-pay | Admitting: Family Medicine

## 2016-09-28 MED ORDER — HYDROCODONE-HOMATROPINE 5-1.5 MG/5ML PO SYRP: 5.0000 mL | ORAL_SOLUTION | Freq: Three times a day (TID) | ORAL | 0 refills | Status: DC | PRN

## 2016-09-28 NOTE — Telephone Encounter (Signed)
Called pt about Rx ready to be pick up

## 2016-09-28 NOTE — Telephone Encounter (Signed)
Notified pt rx. Ready to be pick up front office

## 2016-09-28 NOTE — Telephone Encounter (Signed)
pts daughter (DPR signed) said pt is taking hycodan round the clock and pt still had lingering cough.request refill hycodan. Call when ready for pick up. Pt was seen 09/23/16 and given hycodan # 120 on 09/23/16.

## 2016-10-08 ENCOUNTER — Other Ambulatory Visit: Payer: Self-pay | Admitting: Internal Medicine

## 2016-10-22 ENCOUNTER — Other Ambulatory Visit: Payer: BC Managed Care – PPO

## 2016-10-22 ENCOUNTER — Ambulatory Visit (INDEPENDENT_AMBULATORY_CARE_PROVIDER_SITE_OTHER): Payer: BC Managed Care – PPO | Admitting: Internal Medicine

## 2016-10-22 ENCOUNTER — Ambulatory Visit (INDEPENDENT_AMBULATORY_CARE_PROVIDER_SITE_OTHER)
Admission: RE | Admit: 2016-10-22 | Discharge: 2016-10-22 | Disposition: A | Discharge status: Home / Self Care | Payer: BC Managed Care – PPO | Source: Ambulatory Visit | Attending: Internal Medicine | Admitting: Internal Medicine

## 2016-10-22 ENCOUNTER — Encounter: Payer: Self-pay | Admitting: Internal Medicine

## 2016-10-22 VITALS — BP 120/80 | HR 78 | Temp 98.3°F | Wt 129.5 lb

## 2016-10-22 DIAGNOSIS — M79644 Pain in right finger(s): Secondary | ICD-10-CM | POA: Diagnosis not present

## 2016-10-22 NOTE — Patient Instructions (Signed)
Arthritis Arthritis means joint pain. It can also mean joint disease. A joint is a place where bones come together. People who have arthritis may have:  Red joints.  Swollen joints.  Stiff joints.  Warm joints.  A fever.  A feeling of being sick. Follow these instructions at home: Pay attention to any changes in your symptoms. Take these actions to help with your pain and swelling. Medicines   Take over-the-counter and prescription medicines only as told by your doctor.  Do not take aspirin for pain if your doctor says that you may have gout. Activity   Rest your joint if your doctor tells you to.  Avoid activities that make the pain worse.  Exercise your joint regularly as told by your doctor. Try doing exercises like:  Swimming.  Water aerobics.  Biking.  Walking. Joint Care    If your joint is swollen, keep it raised (elevated) if told by your doctor.  If your joint feels stiff in the morning, try taking a warm shower.  If you have diabetes, do not apply heat without asking your doctor.  If told, apply heat to the joint:  Put a towel between the joint and the hot pack or heating pad.  Leave the heat on the area for 20-30 minutes.  If told, apply ice to the joint:  Put ice in a plastic bag.  Place a towel between your skin and the bag.  Leave the ice on for 20 minutes, 2-3 times per day.  Keep all follow-up visits as told by your doctor. Contact a doctor if:  The pain gets worse.  You have a fever. Get help right away if:  You have very bad pain in your joint.  You have swelling in your joint.  Your joint is red.  Many joints become painful and swollen.  You have very bad back pain.  Your leg is very weak.  You cannot control your pee (urine) or poop (stool). This information is not intended to replace advice given to you by your health care provider. Make sure you discuss any questions you have with your health care  provider. Document Released: 09/16/2009 Document Revised: 11/28/2015 Document Reviewed: 09/17/2014 Elsevier Interactive Patient Education  2017 [employer]  

## 2016-10-22 NOTE — Progress Notes (Signed)
Subjective:    Patient ID: [name], female    DOB: Apr 10, 1971, 46 y.o.   MRN: [id number]  HPI  Pt presents to the clinic today with c/o right thumb pain. This started 1 year ago but has gotten worse over the last few months. She reports the pain started in the right wrist, but has radiated into the right thumb. She describes the pain as throbbing. She has difficulty moving her thumb. She also reports tingling in her wrist, but denies numbness. She has tried Ibuprofen and has been wearing a brace, without any relief. She denies any injury to the area. She is a sign language interpreter.  Review of Systems      Past Medical History:  Diagnosis Date  . Hypertension   . PVC (premature ventricular contraction) 07/20/2016  . Sinus tachycardia 07/20/2016    Current Outpatient Prescriptions  Medication Sig Dispense Refill  . amLODipine (NORVASC) 10 MG tablet TAKE 1 TABLET(10 MG) BY MOUTH DAILY 30 tablet 5  . atorvastatin (LIPITOR) 10 MG tablet Take 1 tablet (10 mg total) by mouth daily. 30 tablet 2  . hydrochlorothiazide (HYDRODIURIL) 25 MG tablet TAKE 1 TABLET(25 MG) BY MOUTH DAILY 30 tablet 8  . HYDROcodone-homatropine (HYCODAN) 5-1.5 MG/5ML syrup Take 5 mLs by mouth every 8 (eight) hours as needed for cough. 120 mL 0  . potassium chloride (K-DUR) 10 MEQ tablet TAKE 1 TABLET(10 MEQ) BY MOUTH TWICE DAILY 60 tablet 5  . venlafaxine XR (EFFEXOR-XR) 37.5 MG 24 hr capsule TAKE 1 CAPSULE(37.5 MG) BY MOUTH DAILY WITH BREAKFAST 30 capsule 3   No current facility-administered medications for this visit.     Allergies  Allergen Reactions  . Lotrel [Amlodipine Besy-Benazepril Hcl] Cough    Family History  Problem Relation Age of Onset  . Hypertension Mother   . Heart murmur Father   . Ovarian cancer Paternal Grandmother   . Lupus Sister     Social History   Social History  . Marital status: Married    Spouse name: N/A  . Number of children: N/A  . Years of education: N/A    Occupational History  . Not on file.   Social History Main Topics  . Smoking status: Light Tobacco Smoker    Packs/day: 0.30  . Smokeless tobacco: Never Used  . Alcohol use Yes     Comment: occasional   . Drug use: No  . Sexual activity: Yes    Birth control/ protection: Surgical   Other Topics Concern  . Not on file   Social History Narrative  . No narrative on file     Constitutional: Denies fever, malaise, fatigue, headache or abrupt weight changes.  Musculoskeletal: Pt reports thumb pain. Denies decrease in range of motion, difficulty with gait, muscle pain or joint swelling.    No other specific complaints in a complete review of systems (except as listed in HPI above).  Objective:   Physical Exam  BP 120/80   Pulse 78   Temp 98.3 F (36.8 C) (Oral)   Wt 129 lb 8 oz (58.7 kg)   SpO2 98%   BMI 22.23 kg/m  Wt Readings from Last 3 Encounters:  10/22/16 129 lb 8 oz (58.7 kg)  09/23/16 128 lb (58.1 kg)  07/20/16 127 lb (57.6 kg)    General: Appears her stated age, in NAD. Musculoskeletal: Normal flexion, extension and rotation of the right wrist. Pain with flexion of the thumb. Normal extension of the thumb. Pain with palpation  of the right CMC. Joint enlarged. Hand grips equal.  Neurological: Alert and oriented. Negative Phalen's. Negative Tinel's.  BMET    Component Value Date/Time   NA 137 07/20/2016 1413   K 3.5 08/28/2016 1016   CL 98 07/20/2016 1413   CO2 32 07/20/2016 1413   GLUCOSE 80 07/20/2016 1413   BUN 12 07/20/2016 1413   CREATININE 0.58 07/20/2016 1413   CALCIUM 9.8 07/20/2016 1413   GFRNONAA >60 08/16/2015 1302   GFRAA >60 08/16/2015 1302    Lipid Panel     Component Value Date/Time   CHOL 154 08/28/2016 1016   TRIG 112.0 08/28/2016 1016   HDL 56.60 08/28/2016 1016   CHOLHDL 3 08/28/2016 1016   VLDL 22.4 08/28/2016 1016   LDLCALC 75 08/28/2016 1016    CBC    Component Value Date/Time   WBC 10.8 (H) 07/20/2016 1413   RBC  4.23 07/20/2016 1413   HGB 12.8 07/20/2016 1413   HGB 12.3 05/10/2007 1305   HCT 37.2 07/20/2016 1413   HCT 35.8 05/10/2007 1305   PLT 507.0 (H) 07/20/2016 1413   PLT 515 (H) 05/10/2007 1305   MCV 87.8 07/20/2016 1413   MCV 82.8 05/10/2007 1305   MCH 30.9 08/16/2015 1302   MCHC 34.4 07/20/2016 1413   RDW 13.6 07/20/2016 1413   RDW 15.1 (H) 05/10/2007 1305   LYMPHSABS 2.4 09/09/2014 1054   LYMPHSABS 2.4 05/10/2007 1305   MONOABS 0.8 09/09/2014 1054   MONOABS 0.4 05/10/2007 1305   EOSABS 0.0 09/09/2014 1054   EOSABS 0.1 05/10/2007 1305   BASOSABS 0.0 09/09/2014 1054   BASOSABS 0.0 05/10/2007 1305    Hgb A1C No results found for: HGBA1C          Assessment & Plan:   Right Thumb Pain:  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 Patsy Lager, but will await xray results first  RTC as needed or if symptoms persist or worsen Lochlin Eppinger, NP

## 2016-11-01 ENCOUNTER — Other Ambulatory Visit: Payer: Self-pay | Admitting: Internal Medicine

## 2016-12-13 ENCOUNTER — Encounter (HOSPITAL_COMMUNITY): Payer: Self-pay | Admitting: Emergency Medicine

## 2016-12-13 ENCOUNTER — Emergency Department (HOSPITAL_COMMUNITY)
Admission: EM | Admit: 2016-12-13 | Discharge: 2016-12-13 | Disposition: A | Discharge status: Home / Self Care | Payer: BC Managed Care – PPO | Attending: Emergency Medicine | Admitting: Emergency Medicine

## 2016-12-13 ENCOUNTER — Emergency Department (HOSPITAL_COMMUNITY): Payer: BC Managed Care – PPO

## 2016-12-13 DIAGNOSIS — Z79899 Other long term (current) drug therapy: Secondary | ICD-10-CM | POA: Insufficient documentation

## 2016-12-13 DIAGNOSIS — I10 Essential (primary) hypertension: Secondary | ICD-10-CM | POA: Insufficient documentation

## 2016-12-13 DIAGNOSIS — R1013 Epigastric pain: Secondary | ICD-10-CM | POA: Diagnosis not present

## 2016-12-13 DIAGNOSIS — F17200 Nicotine dependence, unspecified, uncomplicated: Secondary | ICD-10-CM | POA: Insufficient documentation

## 2016-12-13 DIAGNOSIS — R6883 Chills (without fever): Secondary | ICD-10-CM | POA: Diagnosis not present

## 2016-12-13 DIAGNOSIS — R112 Nausea with vomiting, unspecified: Secondary | ICD-10-CM | POA: Diagnosis not present

## 2016-12-13 LAB — COMPREHENSIVE METABOLIC PANEL
ALBUMIN: 4.7 g/dL (ref 3.5–5.0)
ALT: 23 U/L (ref 14–54)
ANION GAP: 16 — AB (ref 5–15)
AST: 35 U/L (ref 15–41)
Alkaline Phosphatase: 82 U/L (ref 38–126)
BILIRUBIN TOTAL: 0.7 mg/dL (ref 0.3–1.2)
BUN: 13 mg/dL (ref 6–20)
CHLORIDE: 99 mmol/L — AB (ref 101–111)
CO2: 20 mmol/L — ABNORMAL LOW (ref 22–32)
Calcium: 9.8 mg/dL (ref 8.9–10.3)
Creatinine, Ser: 0.77 mg/dL (ref 0.44–1.00)
GFR calc Af Amer: >60 mL/min (ref >60)
GFR calc non Af Amer: >60 mL/min (ref >60)
GLUCOSE: 167 mg/dL — AB (ref 65–99)
Potassium: 2.5 mmol/L — CL (ref 3.5–5.1)
Sodium: 135 mmol/L (ref 135–145)
TOTAL PROTEIN: 8.6 g/dL — AB (ref 6.5–8.1)

## 2016-12-13 LAB — URINALYSIS, ROUTINE W REFLEX MICROSCOPIC
Bilirubin Urine: NEGATIVE
GLUCOSE, UA: NEGATIVE mg/dL
Hgb urine dipstick: NEGATIVE
Ketones, ur: NEGATIVE mg/dL
LEUKOCYTES UA: NEGATIVE
NITRITE: NEGATIVE
PH: 9 — AB (ref 5.0–8.0)
Protein, ur: NEGATIVE mg/dL
Specific Gravity, Urine: 1.013 (ref 1.005–1.030)

## 2016-12-13 LAB — CBC
HEMATOCRIT: 38.1 % (ref 36.0–46.0)
HEMOGLOBIN: 13.4 g/dL (ref 12.0–15.0)
MCH: 30.2 pg (ref 26.0–34.0)
MCHC: 35.2 g/dL (ref 30.0–36.0)
MCV: 86 fL (ref 78.0–100.0)
Platelets: 454 10*3/uL — ABNORMAL HIGH (ref 150–400)
RBC: 4.43 MIL/uL (ref 3.87–5.11)
RDW: 15.2 % (ref 11.5–15.5)
WBC: 20.7 10*3/uL — AB (ref 4.0–10.5)

## 2016-12-13 LAB — I-STAT BETA HCG BLOOD, ED (MC, WL, AP ONLY): I-stat hCG, quantitative: <5 m[IU]/mL (ref <5)

## 2016-12-13 LAB — LIPASE, BLOOD: Lipase: 27 U/L (ref 11–51)

## 2016-12-13 LAB — MAGNESIUM: MAGNESIUM: 1.6 mg/dL — AB (ref 1.7–2.4)

## 2016-12-13 MED ORDER — MORPHINE SULFATE (PF) 4 MG/ML IV SOLN
4.0000 mg | Freq: Once | INTRAVENOUS | Status: AC
  Administered 2016-12-13: 4 mg via INTRAVENOUS
  Filled 2016-12-13: qty 1

## 2016-12-13 MED ORDER — ONDANSETRON HCL 4 MG PO TABS: 4.0000 mg | ORAL_TABLET | Freq: Four times a day (QID) | ORAL | 0 refills | Status: AC

## 2016-12-13 MED ORDER — OMEPRAZOLE 20 MG PO CPDR: 20.0000 mg | DELAYED_RELEASE_CAPSULE | Freq: Every day | ORAL | 0 refills | Status: DC

## 2016-12-13 MED ORDER — MAGNESIUM SULFATE IN D5W 1-5 GM/100ML-% IV SOLN
1.0000 g | Freq: Once | INTRAVENOUS | Status: AC
  Administered 2016-12-13: 1 g via INTRAVENOUS
  Filled 2016-12-13: qty 100

## 2016-12-13 MED ORDER — SODIUM CHLORIDE 0.9 % IV BOLUS (SEPSIS)
1000.0000 mL | Freq: Once | INTRAVENOUS | Status: AC
  Administered 2016-12-13: 1000 mL via INTRAVENOUS

## 2016-12-13 MED ORDER — ONDANSETRON HCL 4 MG/2ML IJ SOLN
4.0000 mg | Freq: Once | INTRAMUSCULAR | Status: AC
  Administered 2016-12-13: 4 mg via INTRAVENOUS
  Filled 2016-12-13: qty 2

## 2016-12-13 MED ORDER — POTASSIUM CHLORIDE CRYS ER 20 MEQ PO TBCR
40.0000 meq | EXTENDED_RELEASE_TABLET | Freq: Once | ORAL | Status: AC
  Administered 2016-12-13: 40 meq via ORAL
  Filled 2016-12-13: qty 2

## 2016-12-13 MED ORDER — HALOPERIDOL LACTATE 5 MG/ML IJ SOLN
2.0000 mg | Freq: Once | INTRAMUSCULAR | Status: AC
  Administered 2016-12-13: 2 mg via INTRAVENOUS
  Filled 2016-12-13: qty 1

## 2016-12-13 NOTE — ED Triage Notes (Signed)
Pt sts mid abd pain with N/V starting this am; pt sts hx of same x 2 without diagnosis; pt hyperventilating

## 2016-12-13 NOTE — ED Provider Notes (Signed)
[facility]-EMERGENCY DEPT Provider Note   CSN: [id number] Arrival date & time: 12/13/16  1357     History   Chief Complaint Chief Complaint  Patient presents with  . Abdominal Pain  . Emesis    HPI [name] is a 46 y.o. female.  HPI  Patient is a 46 year old female past medical history significant for hypertension, hyperlipidemia, who presents to the emergency department with a 6 hour history of progressively worsening upper abdominal pain. Describes as dull, cramping, constant and "spasms". This morning was in her normal state of health. Associated with nausea, approximately 10 episodes of nonbloody emesis, and chills. Anorexic, has not had anything to eat today. Denies fever, diarrhea, bloody stool. Has not taken anything for the pain. States she cannot get comfortable. Nothing improves or worsens the pain. Feels similar to prior episodes of severe abdominal pain that she had in 2009, 2 episodes 3 weeks apart. Has had no episodes since that time. Patient was admitted for both of these episodes, had an extensive workup including evaluation by gastroenterology and CT imaging. Told she had enteritis, but no definitive explanation for her abdominal pain. Denies chronic NSAID use. No recent steroid use. No alcohol use. No prior h/o nephrolithiasis.   Past Medical History:  Diagnosis Date  . Hypertension   . PVC (premature ventricular contraction) 07/20/2016  . Sinus tachycardia 07/20/2016    Patient Active Problem List   Diagnosis Date Noted  . Sinus tachycardia 07/20/2016  . HYPERTENSION, BENIGN ESSENTIAL 01/26/2007    Past Surgical History:  Procedure Laterality Date  . ABDOMINAL HYSTERECTOMY    . TUBAL LIGATION      OB History    No data available       Home Medications    Prior to Admission medications   Medication Sig Start Date End Date Taking? Authorizing Provider  amLODipine (NORVASC) 10 MG tablet TAKE 1 TABLET(10 MG) BY MOUTH DAILY 11/02/16   Lorre Munroe,  NP  atorvastatin (LIPITOR) 10 MG tablet TAKE 1 TABLET(10 MG) BY MOUTH DAILY 11/02/16   Lorre Munroe, NP  hydrochlorothiazide (HYDRODIURIL) 25 MG tablet TAKE 1 TABLET(25 MG) BY MOUTH DAILY 09/08/16   Lorre Munroe, NP  HYDROcodone-homatropine (HYCODAN) 5-1.5 MG/5ML syrup Take 5 mLs by mouth every 8 (eight) hours as needed for cough. 09/28/16   Dianne Dun, MD  omeprazole (PRILOSEC) 20 MG capsule Take 1 capsule (20 mg total) by mouth daily. 12/13/16 01/12/17  Corena Herter, MD  ondansetron (ZOFRAN) 4 MG tablet Take 1 tablet (4 mg total) by mouth every 6 (six) hours. 12/13/16 12/16/16  Corena Herter, MD  potassium chloride (K-DUR) 10 MEQ tablet TAKE 1 TABLET(10 MEQ) BY MOUTH TWICE DAILY 08/21/16   Lorre Munroe, NP  venlafaxine XR (EFFEXOR-XR) 37.5 MG 24 hr capsule TAKE 1 CAPSULE(37.5 MG) BY MOUTH DAILY WITH BREAKFAST 08/31/16   Lorre Munroe, NP    Family History Family History  Problem Relation Age of Onset  . Hypertension Mother   . Heart murmur Father   . Ovarian cancer Paternal Grandmother   . Lupus Sister     Social History Social History  Substance Use Topics  . Smoking status: Light Tobacco Smoker    Packs/day: 0.30  . Smokeless tobacco: Never Used  . Alcohol use Yes     Comment: occasional      Allergies   Lotrel [amlodipine besy-benazepril hcl]   Review of Systems Review of Systems  Constitutional: Positive for appetite change and chills.  Negative for fever.  HENT: Negative for congestion and trouble swallowing.   Respiratory: Negative for cough, chest tightness and shortness of breath.   Cardiovascular: Negative for chest pain.  Gastrointestinal: Positive for abdominal pain, nausea and vomiting. Negative for abdominal distention, blood in stool and diarrhea.  Genitourinary: Negative for dysuria, flank pain and hematuria.  Musculoskeletal: Negative for back pain and gait problem.  Skin: Negative for rash.  Neurological: Negative for dizziness, seizures, weakness  and light-headedness.  Psychiatric/Behavioral: Negative for behavioral problems.     Physical Exam Updated Vital Signs BP 115/84   Pulse (!) 111   Temp 98.1 F (36.7 C) (Rectal)   Resp 19   SpO2 100%   Physical Exam  Constitutional: She is oriented to person, place, and time. She appears well-developed and well-nourished. She appears distressed.  HENT:  Head: Atraumatic.  Mouth/Throat: Oropharynx is clear and moist.  Eyes: Conjunctivae are normal.  Neck: Normal range of motion.  Cardiovascular: Regular rhythm, normal heart sounds and intact distal pulses.   Tachycardic, 100s  Pulmonary/Chest: Effort normal and breath sounds normal. No respiratory distress.  Abdominal: She exhibits no distension and no mass. There is tenderness ( Significant tenderness to epigastric and right upper quadrant). There is no rebound and no guarding.  Positive Murphy's sign. Negative McBurney's point, negative psoas and Rovsing. No CVA tenderness. No pulsatile abdominal mass.  Musculoskeletal: Normal range of motion. She exhibits no edema.  Neurological: She is alert and oriented to person, place, and time.  Skin: Skin is warm. No pallor.  Psychiatric: She has a normal mood and affect.     ED Treatments / Results  Labs (all labs ordered are listed, but only abnormal results are displayed) Labs Reviewed  COMPREHENSIVE METABOLIC PANEL - Abnormal; Notable for the following:       Result Value   Potassium 2.5 (*)    Chloride 99 (*)    CO2 20 (*)    Glucose, Bld 167 (*)    Total Protein 8.6 (*)    Anion gap 16 (*)    All other components within normal limits  CBC - Abnormal; Notable for the following:    WBC 20.7 (*)    Platelets 454 (*)    All other components within normal limits  URINALYSIS, ROUTINE W REFLEX MICROSCOPIC - Abnormal; Notable for the following:    pH 9.0 (*)    All other components within normal limits  MAGNESIUM - Abnormal; Notable for the following:    Magnesium 1.6 (*)      All other components within normal limits  LIPASE, BLOOD  I-STAT BETA HCG BLOOD, ED ([facility], [facility], [facility] ONLY)    EKG  EKG Interpretation None       Radiology [place] Abdomen Limited  Result Date: 12/13/2016 CLINICAL DATA:  Right upper quadrant pain with nausea and vomiting EXAM: ULTRASOUND ABDOMEN LIMITED RIGHT UPPER QUADRANT COMPARISON:  CT 12/19/2007 FINDINGS: Gallbladder: No gallstones or wall thickening visualized. No sonographic Murphy sign noted by sonographer. Common bile duct: Diameter: 3.4 mm Liver: No focal lesion identified. Within normal limits in parenchymal echogenicity. IMPRESSION: Negative right upper quadrant abdominal ultrasound Electronically Signed   By: Jasmine PangKim  Fujinaga M.D.   On: 12/13/2016 18:49    Procedures Procedures (including critical care time)  Medications Ordered in ED Medications  sodium chloride 0.9 % bolus 1,000 mL (0 mLs Intravenous Stopped 12/13/16 1829)  ondansetron (ZOFRAN) injection 4 mg (4 mg Intravenous Given 12/13/16 1639)  morphine 4 MG/ML injection 4  mg (4 mg Intravenous Given 12/13/16 1639)  haloperidol lactate (HALDOL) injection 2 mg (2 mg Intravenous Given 12/13/16 1918)  magnesium sulfate IVPB 1 g 100 mL (0 g Intravenous Stopped 12/13/16 2102)  sodium chloride 0.9 % bolus 1,000 mL (0 mLs Intravenous Stopped 12/13/16 2146)  potassium chloride SA (K-DUR,KLOR-CON) CR tablet 40 mEq (40 mEq Oral Given 12/13/16 2036)  potassium chloride SA (K-DUR,KLOR-CON) CR tablet 40 mEq (40 mEq Oral Given 12/13/16 2125)     Initial Impression / Assessment and Plan / ED Course  I have reviewed the triage vital signs and the nursing notes.  Pertinent labs & imaging results that were available during my care of the patient were reviewed by me and considered in my medical decision making (see chart for details).     Patient is a 46 year old female with past medical history significant for hypertension, who presents to the emergency department with a 1 day history of upper  abdominal pain associated with nausea and vomiting. No diarrhea. No fever. 2 prior episodes years prior in the past with extensive workup that was unrevealing of a diagnosis. On arrival in obvious distress, not ill appearing. Afebrile, tachycardic to the 100s. Normotensive. SPO2 100% on room air.  Differential includes acute cholecystitis, symptomatic cholelithiasis, gastritis/peptic ulcer disease, pancreatitis, gastric motility problems. Patient with chronic hypokalemia, takes potassium replacement. Clinical picture is not consistent with acute appendicitis. Prior hysterectomy. Doubt small bowel obstruction, no abdominal distention, normal bowel movements. Doubt diverticulitis, no lower abdominal tenderness. Doubt perforated ulcer, no risk factors for gastritis, no peritonitis on exam.  Lab work [employer] for leukocytosis of 20, hemoglobin 13.4. Potassium 2.5, magnesium 1.6. Glucose 167. Creatinine 0.77. UA shows no signs of infectious process. Lipase 27. Given antiemetics, IV fluid, narcotic pain medication. Will obtain a right upper quadrant ultrasound to evaluate for gallbladder etiology.  On reevaluation continues to have significant abdominal pain, mild improvement, given Haldol IV. Ultrasound showed no acute findings. Improvement of abdominal pain following Haldol. Given IV mag and by mouth potassium replacement.   Reevaluation significant improvement of her abdominal pain tolerating PO. Continues to have a benign abdominal exam. Did not feel that CT imaging is necessary at this time. Patient stable for discharge home. Given a prescription for antibiotics. Discussed follow-up with her primary care physician for possible GI referral. Given strict return depressions to the emergency department. No questions or concerns at time of discharge.  Final Clinical Impressions(s) / ED Diagnoses   Final diagnoses:  Non-intractable vomiting with nausea, unspecified vomiting type  Epigastric abdominal pain     New Prescriptions Discharge Medication List as of 12/13/2016  9:25 PM    START taking these medications   Details  omeprazole (PRILOSEC) 20 MG capsule Take 1 capsule (20 mg total) by mouth daily., Starting Sun 12/13/2016, Until Tue 01/12/2017, Print    ondansetron (ZOFRAN) 4 MG tablet Take 1 tablet (4 mg total) by mouth every 6 (six) hours., Starting Sun 12/13/2016, Until Wed 12/16/2016, Print         Corena Herter, MD 12/13/16 1610    Derwood Kaplan, MD 12/16/16 [id number]

## 2016-12-13 NOTE — ED Notes (Signed)
Bedside commode at pt's bedside. 

## 2016-12-13 NOTE — ED Notes (Signed)
Patient transported to Ultrasound 

## 2016-12-13 NOTE — ED Notes (Signed)
Dr. Rhunette CroftNanavati informed that pain meds given to pt are "not working".

## 2016-12-13 NOTE — ED Notes (Signed)
ED Provider at bedside. 

## 2016-12-17 ENCOUNTER — Ambulatory Visit (INDEPENDENT_AMBULATORY_CARE_PROVIDER_SITE_OTHER): Payer: BC Managed Care – PPO | Admitting: Internal Medicine

## 2016-12-17 ENCOUNTER — Encounter: Payer: Self-pay | Admitting: Internal Medicine

## 2016-12-17 VITALS — BP 120/84 | HR 99 | Temp 98.1°F | Wt 131.5 lb

## 2016-12-17 DIAGNOSIS — F489 Nonpsychotic mental disorder, unspecified: Secondary | ICD-10-CM

## 2016-12-17 DIAGNOSIS — K2900 Acute gastritis without bleeding: Secondary | ICD-10-CM

## 2016-12-17 DIAGNOSIS — R4589 Other symptoms and signs involving emotional state: Secondary | ICD-10-CM

## 2016-12-17 DIAGNOSIS — K219 Gastro-esophageal reflux disease without esophagitis: Secondary | ICD-10-CM | POA: Diagnosis not present

## 2016-12-17 MED ORDER — VENLAFAXINE HCL ER 75 MG PO CP24: 75.0000 mg | ORAL_CAPSULE | Freq: Every day | ORAL | 2 refills | Status: DC

## 2016-12-17 NOTE — Progress Notes (Signed)
Subjective:    Patient ID: [name], female    DOB: 1970-10-04, 46 y.o.   MRN: [id number]  HPI  Pt presents to the clinic today for ER followup. She went to the ER 6/10 with c/o abdominal pain, nausea and vomiting. Her WBC was 20 but other labs unremarkable. ECG was normal. RUQ ultrasound was normal. She was treated with IV fluids, antiemetics, Morphine and Haldol. She was discharged with a RX for Prilosec and advised to follow up with her PCP for GI referral. Since discharge, she reports improvement in her pain. She denies nausea or vomiting at this time. Her bowels are moving normally.  She also c/o increased moodiness. She has noticed this over the last few weeks. She is taking Effexor as prescribed but does not feel like it is as effective as it used to be. She thinks she would like to go up on the dose. She denies anxiety, depression, SI/HI.  Review of Systems  Past Medical History:  Diagnosis Date  . Hypertension   . PVC (premature ventricular contraction) 07/20/2016  . Sinus tachycardia 07/20/2016    Current Outpatient Prescriptions  Medication Sig Dispense Refill  . amLODipine (NORVASC) 10 MG tablet TAKE 1 TABLET(10 MG) BY MOUTH DAILY 30 tablet 4  . atorvastatin (LIPITOR) 10 MG tablet TAKE 1 TABLET(10 MG) BY MOUTH DAILY 30 tablet 4  . hydrochlorothiazide (HYDRODIURIL) 25 MG tablet TAKE 1 TABLET(25 MG) BY MOUTH DAILY 30 tablet 8  . HYDROcodone-homatropine (HYCODAN) 5-1.5 MG/5ML syrup Take 5 mLs by mouth every 8 (eight) hours as needed for cough. 120 mL 0  . omeprazole (PRILOSEC) 20 MG capsule Take 1 capsule (20 mg total) by mouth daily. 30 capsule 0  . potassium chloride (K-DUR) 10 MEQ tablet TAKE 1 TABLET(10 MEQ) BY MOUTH TWICE DAILY 60 tablet 5  . venlafaxine XR (EFFEXOR-XR) 37.5 MG 24 hr capsule TAKE 1 CAPSULE(37.5 MG) BY MOUTH DAILY WITH BREAKFAST 30 capsule 3   No current facility-administered medications for this visit.     Allergies  Allergen Reactions  .  Lotrel [Amlodipine Besy-Benazepril Hcl] Cough    Family History  Problem Relation Age of Onset  . Hypertension Mother   . Heart murmur Father   . Ovarian cancer Paternal Grandmother   . Lupus Sister     Social History   Social History  . Marital status: Married    Spouse name: N/A  . Number of children: N/A  . Years of education: N/A   Occupational History  . Not on file.   Social History Main Topics  . Smoking status: Light Tobacco Smoker    Packs/day: 0.30  . Smokeless tobacco: Never Used  . Alcohol use Yes     Comment: occasional   . Drug use: No  . Sexual activity: Yes    Birth control/ protection: Surgical   Other Topics Concern  . Not on file   Social History Narrative  . No narrative on file     Constitutional: Denies fever, malaise, fatigue, headache or abrupt weight changes.  Respiratory: Denies difficulty breathing, shortness of breath, cough or sputum production.   Cardiovascular: Denies chest pain, chest tightness, palpitations or swelling in the hands or feet.  Gastrointestinal: Pt reports intermittent abdominal pain. Denies bloating, constipation, diarrhea or blood in the stool.  Neurological: Denies dizziness, difficulty with memory, difficulty with speech or problems with balance and coordination.  Psych: Pt reports moodiness. Denies anxiety, depression, SI/HI.  No other specific complaints in a complete  review of systems (except as listed in HPI above).     Objective:   Physical Exam   BP 120/84   Pulse 99   Temp 98.1 F (36.7 C) (Oral)   Wt 131 lb 8 oz (59.6 kg)   SpO2 98%   BMI 22.57 kg/m  Wt Readings from Last 3 Encounters:  12/17/16 131 lb 8 oz (59.6 kg)  10/22/16 129 lb 8 oz (58.7 kg)  09/23/16 128 lb (58.1 kg)    General: Appears her stated age, well developed, well nourished in NAD. Abdomen: Soft and mildly tender in the epigastric region. Normal bowel sounds. No distention or masses noted.  Neurological: Alert and  oriented.  Psychiatric: Mood and affect normal. Behavior is normal. Judgment and thought content normal.    BMET    Component Value Date/Time   NA 135 12/13/2016 1418   K 2.5 (LL) 12/13/2016 1418   CL 99 (L) 12/13/2016 1418   CO2 20 (L) 12/13/2016 1418   GLUCOSE 167 (H) 12/13/2016 1418   BUN 13 12/13/2016 1418   CREATININE 0.77 12/13/2016 1418   CALCIUM 9.8 12/13/2016 1418   GFRNONAA >60 12/13/2016 1418   GFRAA >60 12/13/2016 1418    Lipid Panel     Component Value Date/Time   CHOL 154 08/28/2016 1016   TRIG 112.0 08/28/2016 1016   HDL 56.60 08/28/2016 1016   CHOLHDL 3 08/28/2016 1016   VLDL 22.4 08/28/2016 1016   LDLCALC 75 08/28/2016 1016    CBC    Component Value Date/Time   WBC 20.7 (H) 12/13/2016 1418   RBC 4.43 12/13/2016 1418   HGB 13.4 12/13/2016 1418   HGB 12.3 05/10/2007 1305   HCT 38.1 12/13/2016 1418   HCT 35.8 05/10/2007 1305   PLT 454 (H) 12/13/2016 1418   PLT 515 (H) 05/10/2007 1305   MCV 86.0 12/13/2016 1418   MCV 82.8 05/10/2007 1305   MCH 30.2 12/13/2016 1418   MCHC 35.2 12/13/2016 1418   RDW 15.2 12/13/2016 1418   RDW 15.1 (H) 05/10/2007 1305   LYMPHSABS 2.4 09/09/2014 1054   LYMPHSABS 2.4 05/10/2007 1305   MONOABS 0.8 09/09/2014 1054   MONOABS 0.4 05/10/2007 1305   EOSABS 0.0 09/09/2014 1054   EOSABS 0.1 05/10/2007 1305   BASOSABS 0.0 09/09/2014 1054   BASOSABS 0.0 05/10/2007 1305    Hgb A1C No results found for: HGBA1C         Assessment & Plan:   ER Follow up for Gastritis/GERD:  ER notes, labs and imaging reviewed We will continue Prilosec for 3 months She wants to hold off on GI referral at this time  Moodiness:  Increase Effexor to 75 mg daily, eRx sent to pharmacy  RTC if stomach issues persist or worsen or if increase in Effexor does not seem effective BAITY, REGINA, NP

## 2016-12-17 NOTE — Patient Instructions (Signed)
Food Choices for Gastroesophageal Reflux Disease, Adult When you have gastroesophageal reflux disease (GERD), the foods you eat and your eating habits are very important. Choosing the right foods can help ease your discomfort. What guidelines do I need to follow?  Choose fruits, vegetables, whole grains, and low-fat dairy products.  Choose low-fat meat, fish, and poultry.  Limit fats such as oils, salad dressings, butter, nuts, and avocado.  Keep a food diary. This helps you identify foods that cause symptoms.  Avoid foods that cause symptoms. These may be different for everyone.  Eat small meals often instead of 3 large meals a day.  Eat your meals slowly, in a place where you are relaxed.  Limit fried foods.  Cook foods using methods other than frying.  Avoid drinking alcohol.  Avoid drinking large amounts of liquids with your meals.  Avoid bending over or lying down until 2-3 hours after eating. What foods are not recommended? These are some foods and drinks that may make your symptoms worse: Vegetables  Tomatoes. Tomato juice. Tomato and spaghetti sauce. Chili peppers. Onion and garlic. Horseradish. Fruits  Oranges, grapefruit, and lemon (fruit and juice). Meats  High-fat meats, fish, and poultry. This includes hot dogs, ribs, ham, sausage, salami, and bacon. Dairy  Whole milk and chocolate milk. Sour cream. Cream. Butter. Ice cream. Cream cheese. Drinks  Coffee and tea. Bubbly (carbonated) drinks or energy drinks. Condiments  Hot sauce. Barbecue sauce. Sweets/Desserts  Chocolate and cocoa. Donuts. Peppermint and spearmint. Fats and Oils  High-fat foods. This includes French fries and potato chips. Other  Vinegar. Strong spices. This includes black pepper, white pepper, red pepper, cayenne, curry powder, cloves, ginger, and chili powder. The items listed above may not be a complete list of foods and drinks to avoid. Contact your dietitian for more information.    This information is not intended to replace advice given to you by your health care provider. Make sure you discuss any questions you have with your health care provider. Document Released: 12/22/2011 Document Revised: 11/28/2015 Document Reviewed: 04/26/2013 Elsevier Interactive Patient Education  2017 [employer]  

## 2017-01-19 ENCOUNTER — Other Ambulatory Visit: Payer: Self-pay

## 2017-01-19 MED ORDER — OMEPRAZOLE 20 MG PO CPDR: 20.0000 mg | DELAYED_RELEASE_CAPSULE | Freq: Every day | ORAL | 0 refills | Status: DC

## 2017-01-19 NOTE — Telephone Encounter (Signed)
Pt requesting refill omeprazole to [facility]; pt was seen 12/17/16 and was to try prilosec for 3 months; pt request # [phone] to [facility].advised done.

## 2017-02-05 ENCOUNTER — Ambulatory Visit (INDEPENDENT_AMBULATORY_CARE_PROVIDER_SITE_OTHER): Payer: BC Managed Care – PPO | Admitting: Family Medicine

## 2017-02-05 ENCOUNTER — Encounter: Payer: Self-pay | Admitting: Family Medicine

## 2017-02-05 ENCOUNTER — Encounter (HOSPITAL_COMMUNITY): Payer: Self-pay | Admitting: *Deleted

## 2017-02-05 ENCOUNTER — Ambulatory Visit: Payer: BC Managed Care – PPO | Admitting: Family Medicine

## 2017-02-05 ENCOUNTER — Observation Stay (HOSPITAL_COMMUNITY)
Admission: EM | Admit: 2017-02-05 | Discharge: 2017-02-06 | Disposition: A | Discharge status: Home / Self Care | Payer: BC Managed Care – PPO | Attending: Family Medicine | Admitting: Family Medicine

## 2017-02-05 ENCOUNTER — Emergency Department (HOSPITAL_COMMUNITY): Payer: BC Managed Care – PPO

## 2017-02-05 VITALS — BP 108/80 | HR 97 | Temp 98.0°F | Ht 64.0 in | Wt 132.1 lb

## 2017-02-05 DIAGNOSIS — F17210 Nicotine dependence, cigarettes, uncomplicated: Secondary | ICD-10-CM | POA: Insufficient documentation

## 2017-02-05 DIAGNOSIS — I10 Essential (primary) hypertension: Secondary | ICD-10-CM | POA: Insufficient documentation

## 2017-02-05 DIAGNOSIS — R1013 Epigastric pain: Secondary | ICD-10-CM

## 2017-02-05 DIAGNOSIS — E876 Hypokalemia: Principal | ICD-10-CM | POA: Insufficient documentation

## 2017-02-05 DIAGNOSIS — E871 Hypo-osmolality and hyponatremia: Secondary | ICD-10-CM | POA: Insufficient documentation

## 2017-02-05 DIAGNOSIS — R112 Nausea with vomiting, unspecified: Secondary | ICD-10-CM

## 2017-02-05 DIAGNOSIS — Z9851 Tubal ligation status: Secondary | ICD-10-CM | POA: Diagnosis not present

## 2017-02-05 LAB — COMPREHENSIVE METABOLIC PANEL
ALT: 30 U/L (ref 14–54)
ANION GAP: 12 (ref 5–15)
AST: 27 U/L (ref 15–41)
Albumin: 4.6 g/dL (ref 3.5–5.0)
Alkaline Phosphatase: 96 U/L (ref 38–126)
BILIRUBIN TOTAL: 1 mg/dL (ref 0.3–1.2)
BUN: 7 mg/dL (ref 6–20)
CHLORIDE: 94 mmol/L — AB (ref 101–111)
CO2: 26 mmol/L (ref 22–32)
Calcium: 9.9 mg/dL (ref 8.9–10.3)
Creatinine, Ser: 0.58 mg/dL (ref 0.44–1.00)
GFR calc Af Amer: >60 mL/min (ref >60)
Glucose, Bld: 133 mg/dL — ABNORMAL HIGH (ref 65–99)
Potassium: 2.4 mmol/L — CL (ref 3.5–5.1)
Sodium: 132 mmol/L — ABNORMAL LOW (ref 135–145)
TOTAL PROTEIN: 8.5 g/dL — AB (ref 6.5–8.1)

## 2017-02-05 LAB — URINALYSIS, ROUTINE W REFLEX MICROSCOPIC
BACTERIA UA: NONE SEEN
BILIRUBIN URINE: NEGATIVE
Glucose, UA: NEGATIVE mg/dL
KETONES UR: 80 mg/dL — AB
LEUKOCYTES UA: NEGATIVE
NITRITE: NEGATIVE
PH: 7 (ref 5.0–8.0)
Protein, ur: 100 mg/dL — AB
SPECIFIC GRAVITY, URINE: 1.019 (ref 1.005–1.030)

## 2017-02-05 LAB — CBC
HCT: 36.1 % (ref 36.0–46.0)
HEMATOCRIT: 38.1 % (ref 36.0–46.0)
HEMOGLOBIN: 13.2 g/dL (ref 12.0–15.0)
Hemoglobin: 12.5 g/dL (ref 12.0–15.0)
MCH: 29.3 pg (ref 26.0–34.0)
MCH: 29.6 pg (ref 26.0–34.0)
MCHC: 34.6 g/dL (ref 30.0–36.0)
MCHC: 34.6 g/dL (ref 30.0–36.0)
MCV: 84.7 fL (ref 78.0–100.0)
MCV: 85.3 fL (ref 78.0–100.0)
Platelets: 430 10*3/uL — ABNORMAL HIGH (ref 150–400)
Platelets: 408 K/uL — ABNORMAL HIGH (ref 150–400)
RBC: 4.5 MIL/uL (ref 3.87–5.11)
RBC: 4.23 MIL/uL (ref 3.87–5.11)
RDW: 14.7 % (ref 11.5–15.5)
RDW: 14.6 % (ref 11.5–15.5)
WBC: 19.1 10*3/uL — AB (ref 4.0–10.5)
WBC: 16 K/uL — ABNORMAL HIGH (ref 4.0–10.5)

## 2017-02-05 LAB — CREATININE, SERUM
Creatinine, Ser: 0.56 mg/dL (ref 0.44–1.00)
GFR calc Af Amer: >60 mL/min
GFR calc non Af Amer: >60 mL/min

## 2017-02-05 LAB — PHOSPHORUS: Phosphorus: 3.2 mg/dL (ref 2.5–4.6)

## 2017-02-05 LAB — MAGNESIUM: Magnesium: 2.1 mg/dL (ref 1.7–2.4)

## 2017-02-05 LAB — LIPASE, BLOOD: LIPASE: 31 U/L (ref 11–51)

## 2017-02-05 MED ORDER — VENLAFAXINE HCL ER 75 MG PO CP24
75.0000 mg | ORAL_CAPSULE | Freq: Every day | ORAL | Status: DC
  Administered 2017-02-05 – 2017-02-06 (×2): 75 mg via ORAL
  Filled 2017-02-05 (×2): qty 1

## 2017-02-05 MED ORDER — HEPARIN SODIUM (PORCINE) 5000 UNIT/ML IJ SOLN
5000.0000 [IU] | Freq: Three times a day (TID) | INTRAMUSCULAR | Status: DC
  Administered 2017-02-05 – 2017-02-06 (×2): 5000 [IU] via SUBCUTANEOUS
  Filled 2017-02-05 (×2): qty 1

## 2017-02-05 MED ORDER — ONDANSETRON HCL 4 MG/2ML IJ SOLN
4.0000 mg | Freq: Once | INTRAMUSCULAR | Status: AC
  Administered 2017-02-05: 4 mg via INTRAVENOUS
  Filled 2017-02-05: qty 2

## 2017-02-05 MED ORDER — PROMETHAZINE HCL 25 MG/ML IJ SOLN
12.5000 mg | Freq: Four times a day (QID) | INTRAMUSCULAR | Status: DC | PRN
Start: 2017-02-05 — End: 2017-02-06
  Administered 2017-02-05: 12.5 mg via INTRAVENOUS
  Filled 2017-02-05 (×2): qty 1

## 2017-02-05 MED ORDER — PROMETHAZINE HCL 25 MG RE SUPP: 25.0000 mg | Freq: Four times a day (QID) | RECTAL | Status: DC | PRN

## 2017-02-05 MED ORDER — POTASSIUM CHLORIDE 10 MEQ/100ML IV SOLN
10.0000 meq | INTRAVENOUS | Status: AC
  Administered 2017-02-05 (×3): 10 meq via INTRAVENOUS
  Filled 2017-02-05 (×3): qty 100

## 2017-02-05 MED ORDER — PROMETHAZINE HCL 25 MG/ML IJ SOLN
25.0000 mg | Freq: Once | INTRAMUSCULAR | Status: AC
  Administered 2017-02-05: 25 mg via INTRAMUSCULAR

## 2017-02-05 MED ORDER — SODIUM CHLORIDE 0.9 % IV BOLUS (SEPSIS)
500.0000 mL | Freq: Once | INTRAVENOUS | Status: AC
  Administered 2017-02-05: 500 mL via INTRAVENOUS

## 2017-02-05 MED ORDER — KCL IN DEXTROSE-NACL 40-5-0.45 MEQ/L-%-% IV SOLN
INTRAVENOUS | Status: DC
  Administered 2017-02-05 – 2017-02-06 (×2): via INTRAVENOUS
  Filled 2017-02-05 (×3): qty 1000

## 2017-02-05 MED ORDER — PROMETHAZINE HCL 25 MG PO TABS: 12.5000 mg | ORAL_TABLET | Freq: Three times a day (TID) | ORAL | 0 refills | Status: DC | PRN

## 2017-02-05 MED ORDER — FENTANYL CITRATE (PF) 100 MCG/2ML IJ SOLN
50.0000 ug | Freq: Once | INTRAMUSCULAR | Status: AC
  Administered 2017-02-05: 50 ug via INTRAVENOUS
  Filled 2017-02-05: qty 2

## 2017-02-05 MED ORDER — PANTOPRAZOLE SODIUM 40 MG PO TBEC
40.0000 mg | DELAYED_RELEASE_TABLET | Freq: Every day | ORAL | Status: DC
  Administered 2017-02-05 – 2017-02-06 (×2): 40 mg via ORAL
  Filled 2017-02-05 (×2): qty 1

## 2017-02-05 MED ORDER — SODIUM CHLORIDE 0.9 % IV BOLUS (SEPSIS)
1000.0000 mL | Freq: Once | INTRAVENOUS | Status: AC
  Administered 2017-02-05: 1000 mL via INTRAVENOUS

## 2017-02-05 MED ORDER — PROMETHAZINE HCL 25 MG PO TABS
25.0000 mg | ORAL_TABLET | Freq: Four times a day (QID) | ORAL | Status: DC | PRN
Start: 2017-02-05 — End: 2017-02-06
  Administered 2017-02-06: 25 mg via ORAL
  Filled 2017-02-05: qty 1

## 2017-02-05 MED ORDER — POTASSIUM CHLORIDE CRYS ER 20 MEQ PO TBCR: 40.0000 meq | EXTENDED_RELEASE_TABLET | Freq: Two times a day (BID) | ORAL | Status: DC

## 2017-02-05 MED ORDER — IOPAMIDOL (ISOVUE-300) INJECTION 61%
INTRAVENOUS | Status: AC
  Administered 2017-02-05: 100 mL
  Filled 2017-02-05: qty 100

## 2017-02-05 MED ORDER — FENTANYL CITRATE (PF) 100 MCG/2ML IJ SOLN: 25.0000 ug | INTRAMUSCULAR | Status: DC | PRN

## 2017-02-05 NOTE — Patient Instructions (Signed)
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  We will refer you to GI, will treat with restarting omeprazole 20mg  twice daily, will also use phenergan as needed for nausea at home. Continue to push small sips of fluids. If worsening pain, or unable to keep fluids down over weekend, seek care at ER as you may need IV fluids to stay hydrated.

## 2017-02-05 NOTE — ED Notes (Signed)
Patient at CT

## 2017-02-05 NOTE — Progress Notes (Signed)
BP 108/80 (BP Location: Left Arm, Patient Position: Sitting, Cuff Size: Normal)   Pulse 97   Temp 98 F (36.7 C) (Oral)   Ht 5' 4"  (1.626 m)   Wt 132 lb 1.9 oz (59.9 kg)   SpO2 97%   BMI 22.68 kg/m    CC: vomiting Subjective:    Patient ID: [name], female    DOB: [birth year], 46 y.o.   MRN: [id number]  HPI: [name] is a 46 y.o. female presenting on 02/05/2017 for Abdominal Pain (sx started last night after she ate dinner, pt reports she did not eat anything new ); Dizziness; Emesis (she has vomitted atleast 3 times, has tried to drink water but unable to keep down); Nausea (Has tried taken some zofran once early this morning ); and Chills   Abd pain/cramping and spasms after dinner last night. Points to epigastric region. Several episodes of emesis last night associated with nausea, discomfort. Emesis in waiting room today - with ongoing dry heaving. NBNB. Last BM was small this morning. Trouble passing gas.  Ate grilled pork chop, mac/cheese, cabbage for dinner last night.   + chills but no fevers. Denies chest pain, rashes, joint pains, red eyes.  No urinary changes - no dysuria or hematuria. No blood in stool.  Known GERD - she had run out of acid reducer.  Took zofran early this morning 4am - no improvement.  Denies h/o kidney stones or gallstones.  No sick contacts at home.  No recent travel.  No alcohol or significant NSAID use.   Seen at ER 12/2016 with abd pain, n/v, WBC 20, RUQ US WNL. Treated with IVF, antiemetics, morphine, haldol, discharged with omeprazole and rec GI referral.   Had prior similar episodes back in 2009, at which time she was admitted with extensive workup (CT imaging and GI eval). CT at that time showed enteritis (infectious vs inflammatory).   S/p hysterectomy, no other abd surgery.  Has seen GI previously - no records in our chart.   Relevant past medical, surgical, family and social history reviewed and updated as indicated.  Interim medical history since our last visit reviewed. Allergies and medications reviewed and updated. Outpatient Medications Prior to Visit  Medication Sig Dispense Refill  . amLODipine (NORVASC) 10 MG tablet TAKE 1 TABLET(10 MG) BY MOUTH DAILY 30 tablet 4  . atorvastatin (LIPITOR) 10 MG tablet TAKE 1 TABLET(10 MG) BY MOUTH DAILY 30 tablet 4  . hydrochlorothiazide (HYDRODIURIL) 25 MG tablet TAKE 1 TABLET(25 MG) BY MOUTH DAILY 30 tablet 8  . omeprazole (PRILOSEC) 20 MG capsule Take 1 capsule (20 mg total) by mouth daily. 90 capsule 0  . potassium chloride (K-DUR) 10 MEQ tablet TAKE 1 TABLET(10 MEQ) BY MOUTH TWICE DAILY 60 tablet 5  . venlafaxine XR (EFFEXOR-XR) 75 MG 24 hr capsule Take 1 capsule (75 mg total) by mouth daily with breakfast. 30 capsule 2   No facility-administered medications prior to visit.      Per HPI unless specifically indicated in ROS section below Review of Systems     Objective:    BP 108/80 (BP Location: Left Arm, Patient Position: Sitting, Cuff Size: Normal)   Pulse 97   Temp 98 F (36.7 C) (Oral)   Ht 5' 4"  (1.626 m)   Wt 132 lb 1.9 oz (59.9 kg)   SpO2 97%   BMI 22.68 kg/m   Wt Readings from Last 3 Encounters:  02/05/17 132 lb 1.9 oz (59.9 kg)  12/17/16  131 lb 8 oz (59.6 kg)  10/22/16 129 lb 8 oz (58.7 kg)    Physical Exam  Constitutional: She appears well-developed and well-nourished. No distress.  HENT:  Mouth/Throat: Oropharynx is clear and moist. No oropharyngeal exudate.  Neck: No thyromegaly present.  Cardiovascular: Regular rhythm, normal heart sounds and intact distal pulses.   No murmur heard. Mild tachycardia  Pulmonary/Chest: Effort normal and breath sounds normal. No respiratory distress. She has no wheezes. She has no rales.  Abdominal: Normal appearance and bowel sounds are normal. She exhibits no distension. There is no hepatosplenomegaly. There is tenderness (marked) in the epigastric area. There is guarding. There is no rigidity,  no rebound, no CVA tenderness and negative Murphy's sign.  Musculoskeletal: She exhibits no edema.  Lymphadenopathy:    She has no cervical adenopathy.  Skin: Skin is warm and dry. No rash noted.  Nursing note and vitals reviewed.  Results for orders placed or performed during the hospital encounter of 12/13/16  Lipase, blood  Result Value Ref Range   Lipase 27 11 - 51 U/L  Comprehensive metabolic panel  Result Value Ref Range   Sodium 135 135 - 145 mmol/L   Potassium 2.5 (LL) 3.5 - 5.1 mmol/L   Chloride 99 (L) 101 - 111 mmol/L   CO2 20 (L) 22 - 32 mmol/L   Glucose, Bld 167 (H) 65 - 99 mg/dL   BUN 13 6 - 20 mg/dL   Creatinine, Ser 0.77 0.44 - 1.00 mg/dL   Calcium 9.8 8.9 - 10.3 mg/dL   Total Protein 8.6 (H) 6.5 - 8.1 g/dL   Albumin 4.7 3.5 - 5.0 g/dL   AST 35 15 - 41 U/L   ALT 23 14 - 54 U/L   Alkaline Phosphatase 82 38 - 126 U/L   Total Bilirubin 0.7 0.3 - 1.2 mg/dL   GFR calc non Af Amer >60 >60 mL/min   GFR calc Af Amer >60 >60 mL/min   Anion gap 16 (H) 5 - 15  CBC  Result Value Ref Range   WBC 20.7 (H) 4.0 - 10.5 K/uL   RBC 4.43 3.87 - 5.11 MIL/uL   Hemoglobin 13.4 12.0 - 15.0 g/dL   HCT 38.1 36.0 - 46.0 %   MCV 86.0 78.0 - 100.0 fL   MCH 30.2 26.0 - 34.0 pg   MCHC 35.2 30.0 - 36.0 g/dL   RDW 15.2 11.5 - 15.5 %   Platelets 454 (H) 150 - 400 K/uL  Urinalysis, Routine w reflex microscopic  Result Value Ref Range   Color, Urine YELLOW YELLOW   APPearance CLEAR CLEAR   Specific Gravity, Urine 1.013 1.005 - 1.030   pH 9.0 (H) 5.0 - 8.0   Glucose, UA NEGATIVE NEGATIVE mg/dL   Hgb urine dipstick NEGATIVE NEGATIVE   Bilirubin Urine NEGATIVE NEGATIVE   Ketones, ur NEGATIVE NEGATIVE mg/dL   Protein, ur NEGATIVE NEGATIVE mg/dL   Nitrite NEGATIVE NEGATIVE   Leukocytes, UA NEGATIVE NEGATIVE  Magnesium  Result Value Ref Range   Magnesium 1.6 (L) 1.7 - 2.4 mg/dL  I-Stat beta hCG blood, ED  Result Value Ref Range   I-stat hCG, quantitative <5.0 <5 mIU/mL   Comment 3               Assessment & Plan:   Problem List Items Addressed This Visit    Epigastric abdominal pain - Primary    Epigastric abdominal pain with nausea and vomiting over last 12+ hours, unable to keep fluids down.  R/o cholecystitis, gallstones, PUD, H pylori, vs infectious/inflammatory gastritis/enteritis.  Initial plan was IM phenergan and outpatient management with oral phenergan, PPI BID, sips of fluids. However, as no improvement after phenergan 68m IM in office - with ongoing marked epigastric pain with guarding and nausea and dry heaving - I recommended ER evaluation [facility]. I will touch base with charge nurse.       Relevant Orders   Ambulatory referral to Gastroenterology    Other Visit Diagnoses    Non-intractable vomiting with nausea, unspecified vomiting type       Relevant Medications   promethazine (PHENERGAN) injection 25 mg (Completed)   Other Relevant Orders   Ambulatory referral to Gastroenterology   Hypomagnesemia           Follow up plan: Return if symptoms worsen or fail to improve.  JRia Bush MD

## 2017-02-05 NOTE — ED Triage Notes (Signed)
Pt reports onset of mid abd pain last night after eating. Having n/v but denies diarrhea. Went to pcp and sent here for further eval. Given phenergan at dr office.

## 2017-02-05 NOTE — Assessment & Plan Note (Signed)
Epigastric abdominal pain with nausea and vomiting over last 12+ hours, unable to keep fluids down. R/o cholecystitis, gallstones, PUD, H pylori, vs infectious/inflammatory gastritis/enteritis.  Initial plan was IM phenergan and outpatient management with oral phenergan, PPI BID, sips of fluids. However, as no improvement after phenergan 25mg  IM in office - with ongoing marked epigastric pain with guarding and nausea and dry heaving - I recommended ER evaluation - [facility]. I will touch base with charge nurse.

## 2017-02-05 NOTE — ED Provider Notes (Signed)
[facility]-EMERGENCY DEPT Provider Note   CSN: [id number] Arrival date & time: 02/05/17  1334     History   Chief Complaint Chief Complaint  Patient presents with  . Abdominal Pain  . Emesis    HPI [name] is a 46 y.o. female.  She presents for evaluation of abdominal pain with nausea and vomiting and constipation, since yesterday.  She saw her PCP, today, and was referred here.  She has had numerous episodes similar in the past, most recently about 2 months ago.  She denies fever, chills, shortness of breath, chest pain, back pain, weakness or dizziness.  Her emesis has been colored yellow to green, without evident blood.  She denies cough.  No prior definitive diagnosis, for the similar recurrent pain.  There are no other known modifying factors.  HPI  Past Medical History:  Diagnosis Date  . Hypertension   . PVC (premature ventricular contraction) 07/20/2016  . Sinus tachycardia 07/20/2016    Patient Active Problem List   Diagnosis Date Noted  . Epigastric abdominal pain 02/05/2017  . Sinus tachycardia 07/20/2016  . HYPERTENSION, BENIGN ESSENTIAL 01/26/2007    Past Surgical History:  Procedure Laterality Date  . ABDOMINAL HYSTERECTOMY    . TUBAL LIGATION      OB History    No data available       Home Medications    Prior to Admission medications   Medication Sig Start Date End Date Taking? Authorizing Provider  amLODipine (NORVASC) 10 MG tablet TAKE 1 TABLET(10 MG) BY MOUTH DAILY 11/02/16  Yes Baity, Salvadore Oxford, NP  atorvastatin (LIPITOR) 10 MG tablet TAKE 1 TABLET(10 MG) BY MOUTH DAILY 11/02/16  Yes Baity, Salvadore Oxford, NP  hydrochlorothiazide (HYDRODIURIL) 25 MG tablet TAKE 1 TABLET(25 MG) BY MOUTH DAILY 09/08/16  Yes Baity, Salvadore Oxford, NP  omeprazole (PRILOSEC) 20 MG capsule Take 1 capsule (20 mg total) by mouth daily. 01/19/17  Yes Baity, Salvadore Oxford, NP  potassium chloride (K-DUR) 10 MEQ tablet TAKE 1 TABLET(10 MEQ) BY MOUTH TWICE DAILY 08/21/16  Yes Baity, Salvadore Oxford,  NP  promethazine (PHENERGAN) 25 MG tablet Take 0.5-1 tablets (12.5-25 mg total) by mouth every 8 (eight) hours as needed for nausea or vomiting. 02/05/17  Yes Eustaquio Boyden, MD  venlafaxine XR (EFFEXOR-XR) 75 MG 24 hr capsule Take 1 capsule (75 mg total) by mouth daily with breakfast. 12/17/16  Yes Baity, Salvadore Oxford, NP    Family History Family History  Problem Relation Age of Onset  . Hypertension Mother   . Heart murmur Father   . Ovarian cancer Paternal Grandmother   . Lupus Sister     Social History Social History  Substance Use Topics  . Smoking status: Light Tobacco Smoker    Packs/day: 0.30  . Smokeless tobacco: Never Used     Comment: black-n-milds   . Alcohol use Yes     Comment: occasional      Allergies   Lotrel [amlodipine besy-benazepril hcl]   Review of Systems Review of Systems  All other systems reviewed and are negative.    Physical Exam Updated Vital Signs BP 114/87   Pulse 94   Temp 98.1 F (36.7 C) (Oral)   Resp 17   SpO2 98%   Physical Exam  Constitutional: She is oriented to person, place, and time. She appears well-developed and well-nourished. No distress.  HENT:  Head: Normocephalic and atraumatic.  Eyes: Pupils are equal, round, and reactive to light. Conjunctivae and EOM are normal.  Neck: Normal range of motion and phonation normal. Neck supple.  Cardiovascular: Normal rate and regular rhythm.   Pulmonary/Chest: Effort normal and breath sounds normal. She exhibits no tenderness.  Abdominal: Soft. She exhibits no distension and no mass. There is tenderness (Midepigastric and right upper quadrant, mild). There is no rebound and no guarding.  Genitourinary:  Genitourinary Comments: No costovertebral angle tenderness with percussion  Musculoskeletal: Normal range of motion.  Neurological: She is alert and oriented to person, place, and time. She exhibits normal muscle tone.  Skin: Skin is warm and dry.  Psychiatric: She has a normal  mood and affect. Her behavior is normal. Judgment and thought content normal.  Nursing note and vitals reviewed.    ED Treatments / Results  Labs (all labs ordered are listed, but only abnormal results are displayed) Labs Reviewed  COMPREHENSIVE METABOLIC PANEL - Abnormal; Notable for the following:       Result Value   Sodium 132 (*)    Potassium 2.4 (*)    Chloride 94 (*)    Glucose, Bld 133 (*)    Total Protein 8.5 (*)    All other components within normal limits  CBC - Abnormal; Notable for the following:    WBC 19.1 (*)    Platelets 430 (*)    All other components within normal limits  URINALYSIS, ROUTINE W REFLEX MICROSCOPIC - Abnormal; Notable for the following:    APPearance HAZY (*)    Hgb urine dipstick SMALL (*)    Ketones, ur 80 (*)    Protein, ur 100 (*)    Squamous Epithelial / LPF 0-5 (*)    All other components within normal limits  LIPASE, BLOOD    EKG  EKG Interpretation  Date/Time:  Friday February 05 2017 14:40:57 EDT Ventricular Rate:  100 PR Interval:    QRS Duration: 80 QT Interval:  340 QTC Calculation: 439 R Axis:   14 Text Interpretation:  Sinus tachycardia LAE, consider biatrial enlargement Probable anteroseptal infarct, old Nonspecific repol abnormality, diffuse leads Baseline wander in lead(s) V5 since last tracing no significant change Confirmed by Mancel BaleWentz, Ranada Vigorito [phone]([id number]) on 02/05/2017 3:47:08 PM       Radiology Ct Abdomen Pelvis W Contrast  Result Date: 02/05/2017 CLINICAL DATA:  Acute epigastric abdominal pain. EXAM: CT ABDOMEN AND PELVIS WITH CONTRAST TECHNIQUE: Multidetector CT imaging of the abdomen and pelvis was performed using the standard protocol following bolus administration of intravenous contrast. CONTRAST:  100mL ISOVUE-300 IOPAMIDOL (ISOVUE-300) INJECTION 61% COMPARISON:  CT scan of December 19, 2007. FINDINGS: Lower chest: No acute abnormality. Hepatobiliary: No focal liver abnormality is seen. No gallstones, gallbladder wall  thickening, or biliary dilatation. Pancreas: Unremarkable. No pancreatic ductal dilatation or surrounding inflammatory changes. Spleen: Normal in size without focal abnormality. Adrenals/Urinary Tract: Adrenal glands are unremarkable. Right renal cysts are noted. No hydronephrosis or renal obstruction is noted. No renal or ureteral calculi are noted. Urinary bladder is unremarkable. Stomach/Bowel: Stomach is within normal limits. Appendix appears normal. No evidence of bowel wall thickening, distention, or inflammatory changes. Vascular/Lymphatic: No significant vascular findings are present. No enlarged abdominal or pelvic lymph nodes. Reproductive: Status post hysterectomy. No adnexal masses. Other: No abdominal wall hernia or abnormality. No abdominopelvic ascites. Musculoskeletal: No acute or significant osseous findings. IMPRESSION: No acute abnormality seen in the abdomen or pelvis. Electronically Signed   By: Lupita RaiderJames  Green Jr, M.D.   On: 02/05/2017 16:49    Procedures Procedures (including critical care time)  Medications Ordered in  ED Medications  potassium chloride 10 mEq in 100 mL IVPB (10 mEq Intravenous New Bag/Given 02/05/17 1551)  sodium chloride 0.9 % bolus 500 mL (0 mLs Intravenous Stopped 02/05/17 1613)  ondansetron (ZOFRAN) injection 4 mg (4 mg Intravenous Given 02/05/17 1609)  fentaNYL (SUBLIMAZE) injection 50 mcg (50 mcg Intravenous Given 02/05/17 1609)  sodium chloride 0.9 % bolus 1,000 mL (1,000 mLs Intravenous New Bag/Given 02/05/17 1613)  iopamidol (ISOVUE-300) 61 % injection (100 mLs  Contrast Given 02/05/17 1620)     Initial Impression / Assessment and Plan / ED Course  I have reviewed the triage vital signs and the nursing notes.  Pertinent labs & imaging results that were available during my care of the patient were reviewed by me and considered in my medical decision making (see chart for details).      Patient Vitals for the past 24 hrs:  BP Temp Temp src Pulse Resp SpO2    02/05/17 1630 - - - 94 17 98 %  02/05/17 1615 114/87 - - (!) 108 (!) 29 99 %  02/05/17 1530 114/88 - - 98 (!) 21 100 %  02/05/17 1515 115/86 - - (!) 104 (!) 21 98 %  02/05/17 1338 (!) 118/92 98.1 F (36.7 C) Oral (!) 113 17 97 %    4:58 PM Reevaluation with update and discussion. After initial assessment and treatment, an updated evaluation reveals no further complaints.  Findings discussed with the patient and all questions answered. Chyler Creely L   4:59 PM-Consult complete with hospitalist. Patient case explained and discussed. He agrees to admit patient for further evaluation and treatment. Call ended at 17:04  Final Clinical Impressions(s) / ED Diagnoses   Final diagnoses:  Epigastric pain  Nausea and vomiting, intractability of vomiting not specified, unspecified vomiting type  Hypokalemia   Nonspecific abdominal pain which is recurrent associated with significant hypokalemia.  She will require further treatment, with parenteral fluids, and potassium, before she can be discharged.  Nursing Notes Reviewed/ Care Coordinated Applicable Imaging Reviewed Interpretation of Laboratory Data incorporated into ED treatment   Plan: Admit   New Prescriptions New Prescriptions   No medications on file     Mancel BaleWentz, Jahnae Mcadoo, MD 02/05/17 1705

## 2017-02-05 NOTE — H&P (Signed)
History and Physical    [name] ZOX:[id number] DOB: 1971-04-17 DOA: 02/05/2017  PCP: Lorre Munroe, NP  Patient coming from: Home  Chief Complaint: Abdominal discomfort with nausea and vomiting  HPI: [name] is a 46 y.o. female with medical history significant of history of chronic abdominal discomfort with nausea and vomiting. This is been going on since 2009. Patient reports she has had workup done in the past and no one has been able to identify cause. Nothing she is aware of makes the discomfort worse or better. Problem has been persistent and gradually getting worse. Within the last 24 hours patient is unable to tell me how many times she has vomited but says many. She denies any in her emesis. Patient presented to her primary care physician who referred patient to the ER.  ED Course: CT scan of abdomen and pelvis nonrevealing. However patient was found to have profound hypokalemia were consulted for medical evaluation and recommendations.  Review of Systems: As per HPI otherwise 10 point review of systems negative.    Past Medical History:  Diagnosis Date  . Hypertension   . PVC (premature ventricular contraction) 07/20/2016  . Sinus tachycardia 07/20/2016    Past Surgical History:  Procedure Laterality Date  . ABDOMINAL HYSTERECTOMY    . TUBAL LIGATION       reports that she has been smoking.  She has been smoking about 0.30 packs per day. She has never used smokeless tobacco. She reports that she drinks alcohol. She reports that she does not use drugs.  Allergies  Allergen Reactions  . Lotrel [Amlodipine Besy-Benazepril Hcl] Cough    Family History  Problem Relation Age of Onset  . Hypertension Mother   . Heart murmur Father   . Ovarian cancer Paternal Grandmother   . Lupus Sister     Prior to Admission medications   Medication Sig Start Date End Date Taking? Authorizing Provider  amLODipine (NORVASC) 10 MG tablet TAKE 1 TABLET(10 MG) BY MOUTH  DAILY 11/02/16  Yes Baity, Salvadore Oxford, NP  atorvastatin (LIPITOR) 10 MG tablet TAKE 1 TABLET(10 MG) BY MOUTH DAILY 11/02/16  Yes Baity, Salvadore Oxford, NP  hydrochlorothiazide (HYDRODIURIL) 25 MG tablet TAKE 1 TABLET(25 MG) BY MOUTH DAILY 09/08/16  Yes Baity, Salvadore Oxford, NP  omeprazole (PRILOSEC) 20 MG capsule Take 1 capsule (20 mg total) by mouth daily. 01/19/17  Yes Baity, Salvadore Oxford, NP  potassium chloride (K-DUR) 10 MEQ tablet TAKE 1 TABLET(10 MEQ) BY MOUTH TWICE DAILY 08/21/16  Yes Baity, Salvadore Oxford, NP  promethazine (PHENERGAN) 25 MG tablet Take 0.5-1 tablets (12.5-25 mg total) by mouth every 8 (eight) hours as needed for nausea or vomiting. 02/05/17  Yes Eustaquio Boyden, MD  venlafaxine XR (EFFEXOR-XR) 75 MG 24 hr capsule Take 1 capsule (75 mg total) by mouth daily with breakfast. 12/17/16  Yes Lorre Munroe, NP    Physical Exam: Vitals:   02/05/17 1515 02/05/17 1530 02/05/17 1615 02/05/17 1630  BP: 115/86 114/88 114/87   Pulse: (!) 104 98 (!) 108 94  Resp: (!) 21 (!) 21 (!) 29 17  Temp:      TempSrc:      SpO2: 98% 100% 99% 98%    Constitutional: NAD, calm, comfortable Vitals:   02/05/17 1515 02/05/17 1530 02/05/17 1615 02/05/17 1630  BP: 115/86 114/88 114/87   Pulse: (!) 104 98 (!) 108 94  Resp: (!) 21 (!) 21 (!) 29 17  Temp:      TempSrc:  SpO2: 98% 100% 99% 98%   Eyes: PERRL, lids and conjunctivae normal ENMT: Mucous membranes are moist. Posterior pharynx clear of any exudate or lesions.Normal dentition.  Neck: normal, supple, no masses, no thyromegaly Respiratory: clear to auscultation bilaterally, no wheezing, no crackles. Normal respiratory effort. No accessory muscle use.  Cardiovascular: Regular rate and rhythm, no murmurs / rubs / gallops.  Abdomen: Soft, nondistended, generalized discomfort with palpation, no rebound tenderness Musculoskeletal: no clubbing / cyanosis. No joint deformity upper and lower extremities. Good ROM, no contractures. Normal muscle tone.  Skin: no  rashes, lesions, ulcers. No induration, on limited exam Neurologic: Patient answers questions appropriately, oriented to person place and time, moves extremities equally Psychiatric: Normal judgment and insight. Alert and oriented x 3. Normal mood.   Labs on Admission: I have personally reviewed following labs and imaging studies  CBC:  Recent Labs Lab 02/05/17 1345  WBC 19.1*  HGB 13.2  HCT 38.1  MCV 84.7  PLT 430*   Basic Metabolic Panel:  Recent Labs Lab 02/05/17 1345  NA 132*  K 2.4*  CL 94*  CO2 26  GLUCOSE 133*  BUN 7  CREATININE 0.58  CALCIUM 9.9   GFR: Estimated Creatinine Clearance: 75.1 mL/min (by C-G formula based on SCr of 0.58 mg/dL). Liver Function Tests:  Recent Labs Lab 02/05/17 1345  AST 27  ALT 30  ALKPHOS 96  BILITOT 1.0  PROT 8.5*  ALBUMIN 4.6    Recent Labs Lab 02/05/17 1345  LIPASE 31   No results for input(s): AMMONIA in the last 168 hours. Coagulation Profile: No results for input(s): INR, PROTIME in the last 168 hours. Cardiac Enzymes: No results for input(s): CKTOTAL, CKMB, CKMBINDEX, TROPONINI in the last 168 hours. BNP (last 3 results) No results for input(s): PROBNP in the last 8760 hours. HbA1C: No results for input(s): HGBA1C in the last 72 hours. CBG: No results for input(s): GLUCAP in the last 168 hours. Lipid Profile: No results for input(s): CHOL, HDL, LDLCALC, TRIG, CHOLHDL, LDLDIRECT in the last 72 hours. Thyroid Function Tests: No results for input(s): TSH, T4TOTAL, FREET4, T3FREE, THYROIDAB in the last 72 hours. Anemia Panel: No results for input(s): VITAMINB12, FOLATE, FERRITIN, TIBC, IRON, RETICCTPCT in the last 72 hours. Urine analysis:    Component Value Date/Time   COLORURINE YELLOW 02/05/2017 1418   APPEARANCEUR HAZY (A) 02/05/2017 1418   LABSPEC 1.019 02/05/2017 1418   PHURINE 7.0 02/05/2017 1418   GLUCOSEU NEGATIVE 02/05/2017 1418   HGBUR SMALL (A) 02/05/2017 1418   BILIRUBINUR NEGATIVE  02/05/2017 1418   KETONESUR 80 (A) 02/05/2017 1418   PROTEINUR 100 (A) 02/05/2017 1418   UROBILINOGEN 1.0 11/05/2011 0221   NITRITE NEGATIVE 02/05/2017 1418   LEUKOCYTESUR NEGATIVE 02/05/2017 1418    Radiological Exams on Admission: Ct Abdomen Pelvis W Contrast  Result Date: 02/05/2017 CLINICAL DATA:  Acute epigastric abdominal pain. EXAM: CT ABDOMEN AND PELVIS WITH CONTRAST TECHNIQUE: Multidetector CT imaging of the abdomen and pelvis was performed using the standard protocol following bolus administration of intravenous contrast. CONTRAST:  100mL ISOVUE-300 IOPAMIDOL (ISOVUE-300) INJECTION 61% COMPARISON:  CT scan of December 19, 2007. FINDINGS: Lower chest: No acute abnormality. Hepatobiliary: No focal liver abnormality is seen. No gallstones, gallbladder wall thickening, or biliary dilatation. Pancreas: Unremarkable. No pancreatic ductal dilatation or surrounding inflammatory changes. Spleen: Normal in size without focal abnormality. Adrenals/Urinary Tract: Adrenal glands are unremarkable. Right renal cysts are noted. No hydronephrosis or renal obstruction is noted. No renal or ureteral calculi are noted.  Urinary bladder is unremarkable. Stomach/Bowel: Stomach is within normal limits. Appendix appears normal. No evidence of bowel wall thickening, distention, or inflammatory changes. Vascular/Lymphatic: No significant vascular findings are present. No enlarged abdominal or pelvic lymph nodes. Reproductive: Status post hysterectomy. No adnexal masses. Other: No abdominal wall hernia or abnormality. No abdominopelvic ascites. Musculoskeletal: No acute or significant osseous findings. IMPRESSION: No acute abnormality seen in the abdomen or pelvis. Electronically Signed   By: Lupita RaiderJames  Green Jr, M.D.   On: 02/05/2017 16:49    EKG: Independently reviewed. Sinus tachycardia with no ST elevations or depressions  Assessment/Plan Active Problems:   Epigastric abdominal pain - Etiology uncertain CT scan of  abdomen and pelvis negative. Will recommend outpatient GI evaluation. -Treat supportively and advance diet as tolerated    Hypokalemia, gastrointestinal losses - We'll also obtain magnesium levels -Replace IV and place on maintenance IV fluids with higher potassium dose replacement.  Hyponatremia - Secondary to poor oral solute intake - Place on clear liquid diet and advance diet as tolerated - Reassess sodium levels next a.m.  DVT prophylaxis: heparin Code Status:  full Family Communication: d/c family member at bedside. Disposition Plan: pending improvement in po intake and K levels. Consults called: None Admission status: observation   Penny PiaVEGA, Shane Badeaux MD [facility] Pager [phone]  If 7PM-7AM, please contact night-coverage [email or web address] Password [email or web address]  02/05/2017, 5:34 PM

## 2017-02-05 NOTE — Progress Notes (Signed)
[name] is a 46 y.o. female patient admitted from ED awake, alert - oriented  X 4 - no acute distress noted.  VSS - Blood pressure 121/83, pulse 91, temperature 98.5 F (36.9 C), temperature source Oral, resp. rate 17, SpO2 100 %.    IV in place, occlusive dsg intact without redness.  Orientation to room, and floor completed with information packet given to patient/family.  Patient declined safety video at this time.  Admission INP armband ID verified with patient/family, and in place.   SR up x 2, fall assessment complete, with patient and family able to verbalize understanding of risk associated with falls, and verbalized understanding to call nsg before up out of bed.  Call light within reach, patient able to voice, and demonstrate understanding.  Skin, clean-dry- intact without evidence of bruising, or skin tears.   No evidence of skin break down noted on exam.     Will cont to eval and treat per MD orders.  Eligah EastErin M Kaylaann Mountz, RN 02/05/2017 6:46 PM

## 2017-02-06 DIAGNOSIS — R112 Nausea with vomiting, unspecified: Secondary | ICD-10-CM | POA: Diagnosis not present

## 2017-02-06 DIAGNOSIS — E876 Hypokalemia: Secondary | ICD-10-CM | POA: Diagnosis not present

## 2017-02-06 LAB — COMPREHENSIVE METABOLIC PANEL
ALT: 25 U/L (ref 14–54)
ANION GAP: 6 (ref 5–15)
AST: 26 U/L (ref 15–41)
Albumin: 3.8 g/dL (ref 3.5–5.0)
Alkaline Phosphatase: 77 U/L (ref 38–126)
BILIRUBIN TOTAL: 0.8 mg/dL (ref 0.3–1.2)
BUN: 5 mg/dL — AB (ref 6–20)
CALCIUM: 8.6 mg/dL — AB (ref 8.9–10.3)
CO2: 28 mmol/L (ref 22–32)
CREATININE: 0.77 mg/dL (ref 0.44–1.00)
Chloride: 103 mmol/L (ref 101–111)
GLUCOSE: 107 mg/dL — AB (ref 65–99)
POTASSIUM: 3.2 mmol/L — AB (ref 3.5–5.1)
Sodium: 137 mmol/L (ref 135–145)
Total Protein: 7.2 g/dL (ref 6.5–8.1)

## 2017-02-06 LAB — CBC
HCT: 36.5 % (ref 36.0–46.0)
HEMOGLOBIN: 12.2 g/dL (ref 12.0–15.0)
MCH: 29.5 pg (ref 26.0–34.0)
MCHC: 33.4 g/dL (ref 30.0–36.0)
MCV: 88.2 fL (ref 78.0–100.0)
Platelets: 397 10*3/uL (ref 150–400)
RBC: 4.14 MIL/uL (ref 3.87–5.11)
RDW: 15.6 % — ABNORMAL HIGH (ref 11.5–15.5)
WBC: 11.6 10*3/uL — AB (ref 4.0–10.5)

## 2017-02-06 LAB — HIV ANTIBODY (ROUTINE TESTING W REFLEX): HIV SCREEN 4TH GENERATION: NONREACTIVE

## 2017-02-06 MED ORDER — POTASSIUM CHLORIDE CRYS ER 20 MEQ PO TBCR
40.0000 meq | EXTENDED_RELEASE_TABLET | Freq: Once | ORAL | Status: AC
  Administered 2017-02-06: 40 meq via ORAL
  Filled 2017-02-06: qty 2

## 2017-02-06 NOTE — Progress Notes (Signed)
Discharge instructions printed and reviewed with patient and family, and copy given for them to take home. All questions addressed at this time. IV and tele box removed. Room searched for patient belongings, and confirmed with patient that all valuables were accounted for. Family assisted patient to dress prior to volunteer staff escorting patient to discharge via wheelchair.

## 2017-02-06 NOTE — Discharge Summary (Signed)
Physician Discharge Summary  Jetty PeeksMichelle R Alonso ZOX:[id number] DOB: [birth year] DOA: 02/05/2017  PCP: Lorre MunroeBaity, Regina W, NP  Admit date: 02/05/2017 Discharge date: 02/06/2017  Time spent: > 35 minutes  Recommendations for Outpatient Follow-up:  1. Monitor wbc levels 2. Monitor K levels 3. Consider setting up follow up with allergist to test for food allergies.   Discharge Diagnoses:  Active Problems:   Epigastric abdominal pain   Hypokalemia, gastrointestinal losses   Discharge Condition: stable  Diet recommendation: advance as tolerated to low sodium diet  Filed Weights   02/05/17 1845  Weight: 59.9 kg (132 lb)    History of present illness:  46 y.o. female with medical history significant of history of chronic abdominal discomfort with nausea and vomiting. This is been going on since 2009.  She presented to the hospital after many bouts of emesis. Was admitted secondary to profound hypokalemia.  Hospital Course:  Profound Hypokalemia - secondary to GI losses from emesis. Emesis resolved and K replaced and now near normal levels - placed order to k dur 40 meq prior to discharge.  Emesis - resolved with supportive therapy - recommend having patient tested for food allergies  For other medical conditions will continue prior to admission medication regimen.  Procedures:  none  Consultations:  None   Discharge Exam: Vitals:   02/05/17 2143 02/06/17 0510  BP: 114/83 101/71  Pulse: 97 86  Resp: 20 18  Temp: 98.1 F (36.7 C) 98.5 F (36.9 C)    General: Pt in nad, alert and awake Cardiovascular: rrr, no rubs Respiratory: no increased wob, no wheezes  Discharge Instructions   Discharge Instructions    Diet - low sodium heart healthy    Complete by:  As directed    Discharge instructions    Complete by:  As directed    I recommend you follow up with a gastroenterologist for further evaluation and recommendations. Also consider an allergist to assess for food  allergies.   Increase activity slowly    Complete by:  As directed      Current Discharge Medication List    CONTINUE these medications which have NOT CHANGED   Details  amLODipine (NORVASC) 10 MG tablet TAKE 1 TABLET(10 MG) BY MOUTH DAILY Qty: 30 tablet, Refills: 4    atorvastatin (LIPITOR) 10 MG tablet TAKE 1 TABLET(10 MG) BY MOUTH DAILY Qty: 30 tablet, Refills: 4    hydrochlorothiazide (HYDRODIURIL) 25 MG tablet TAKE 1 TABLET(25 MG) BY MOUTH DAILY Qty: 30 tablet, Refills: 8   Associated Diagnoses: Essential hypertension    omeprazole (PRILOSEC) 20 MG capsule Take 1 capsule (20 mg total) by mouth daily. Qty: 90 capsule, Refills: 0    potassium chloride (K-DUR) 10 MEQ tablet TAKE 1 TABLET(10 MEQ) BY MOUTH TWICE DAILY Qty: 60 tablet, Refills: 5    promethazine (PHENERGAN) 25 MG tablet Take 0.5-1 tablets (12.5-25 mg total) by mouth every 8 (eight) hours as needed for nausea or vomiting. Qty: 20 tablet, Refills: 0    venlafaxine XR (EFFEXOR-XR) 75 MG 24 hr capsule Take 1 capsule (75 mg total) by mouth daily with breakfast. Qty: 30 capsule, Refills: 2       Allergies  Allergen Reactions  . Lotrel [Amlodipine Besy-Benazepril Hcl] Cough      The results of significant diagnostics from this hospitalization (including imaging, microbiology, ancillary and laboratory) are listed below for reference.    Significant Diagnostic Studies: Ct Abdomen Pelvis W Contrast  Result Date: 02/05/2017 CLINICAL DATA:  Acute epigastric abdominal  pain. EXAM: CT ABDOMEN AND PELVIS WITH CONTRAST TECHNIQUE: Multidetector CT imaging of the abdomen and pelvis was performed using the standard protocol following bolus administration of intravenous contrast. CONTRAST:  100mL ISOVUE-300 IOPAMIDOL (ISOVUE-300) INJECTION 61% COMPARISON:  CT scan of December 19, 2007. FINDINGS: Lower chest: No acute abnormality. Hepatobiliary: No focal liver abnormality is seen. No gallstones, gallbladder wall thickening, or  biliary dilatation. Pancreas: Unremarkable. No pancreatic ductal dilatation or surrounding inflammatory changes. Spleen: Normal in size without focal abnormality. Adrenals/Urinary Tract: Adrenal glands are unremarkable. Right renal cysts are noted. No hydronephrosis or renal obstruction is noted. No renal or ureteral calculi are noted. Urinary bladder is unremarkable. Stomach/Bowel: Stomach is within normal limits. Appendix appears normal. No evidence of bowel wall thickening, distention, or inflammatory changes. Vascular/Lymphatic: No significant vascular findings are present. No enlarged abdominal or pelvic lymph nodes. Reproductive: Status post hysterectomy. No adnexal masses. Other: No abdominal wall hernia or abnormality. No abdominopelvic ascites. Musculoskeletal: No acute or significant osseous findings. IMPRESSION: No acute abnormality seen in the abdomen or pelvis. Electronically Signed   By: Lupita RaiderJames  Green Jr, M.D.   On: 02/05/2017 16:49    Microbiology: No results found for this or any previous visit (from the past 240 hour(s)).   Labs: Basic Metabolic Panel:  Recent Labs Lab 02/05/17 1345 02/05/17 1900 02/06/17 0548  NA 132*  --  137  K 2.4*  --  3.2*  CL 94*  --  103  CO2 26  --  28  GLUCOSE 133*  --  107*  BUN 7  --  5*  CREATININE 0.58 0.56 0.77  CALCIUM 9.9  --  8.6*  MG  --  2.1  --   PHOS  --  3.2  --    Liver Function Tests:  Recent Labs Lab 02/05/17 1345 02/06/17 0548  AST 27 26  ALT 30 25  ALKPHOS 96 77  BILITOT 1.0 0.8  PROT 8.5* 7.2  ALBUMIN 4.6 3.8    Recent Labs Lab 02/05/17 1345  LIPASE 31   No results for input(s): AMMONIA in the last 168 hours. CBC:  Recent Labs Lab 02/05/17 1345 02/05/17 1900 02/06/17 0548  WBC 19.1* 16.0* 11.6*  HGB 13.2 12.5 12.2  HCT 38.1 36.1 36.5  MCV 84.7 85.3 88.2  PLT 430* 408* 397   Cardiac Enzymes: No results for input(s): CKTOTAL, CKMB, CKMBINDEX, TROPONINI in the last 168 hours. BNP: BNP (last 3  results) No results for input(s): BNP in the last 8760 hours.  ProBNP (last 3 results) No results for input(s): PROBNP in the last 8760 hours.  CBG: No results for input(s): GLUCAP in the last 168 hours.     Signed:  Penny PiaVEGA, Olean Sangster MD.  [facility] 02/06/2017, 10:04 AM

## 2017-02-16 ENCOUNTER — Telehealth: Payer: Self-pay | Admitting: Internal Medicine

## 2017-02-16 NOTE — Telephone Encounter (Signed)
Patient returned Melanie's call. °

## 2017-02-16 NOTE — Telephone Encounter (Signed)
Patient returned Robin's call.

## 2017-02-16 NOTE — Telephone Encounter (Signed)
I am not calling pt referral coordinator called

## 2017-02-16 NOTE — Telephone Encounter (Signed)
Pt left v/m returning Melanies call about appt.

## 2017-02-18 ENCOUNTER — Encounter: Payer: Self-pay | Admitting: Gastroenterology

## 2017-03-07 ENCOUNTER — Other Ambulatory Visit: Payer: Self-pay | Admitting: Internal Medicine

## 2017-03-07 DIAGNOSIS — E876 Hypokalemia: Secondary | ICD-10-CM

## 2017-03-09 ENCOUNTER — Encounter (HOSPITAL_COMMUNITY): Payer: Self-pay | Admitting: Emergency Medicine

## 2017-03-09 ENCOUNTER — Ambulatory Visit (HOSPITAL_COMMUNITY)
Admission: EM | Admit: 2017-03-09 | Discharge: 2017-03-09 | Disposition: A | Discharge status: Home / Self Care | Payer: BC Managed Care – PPO | Attending: Family Medicine | Admitting: Family Medicine

## 2017-03-09 DIAGNOSIS — M62830 Muscle spasm of back: Secondary | ICD-10-CM | POA: Diagnosis not present

## 2017-03-09 DIAGNOSIS — S39012A Strain of muscle, fascia and tendon of lower back, initial encounter: Secondary | ICD-10-CM

## 2017-03-09 MED ORDER — CYCLOBENZAPRINE HCL 10 MG PO TABS: 10.0000 mg | ORAL_TABLET | Freq: Three times a day (TID) | ORAL | 0 refills | Status: DC

## 2017-03-09 NOTE — ED Triage Notes (Addendum)
Patient has back pain that started Friday night.  Stood up from a seated position and had grabbing pain in lower back, across her back.    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  No specific injury.

## 2017-03-09 NOTE — ED Provider Notes (Signed)
  [facility]   [id number] 03/09/17 Arrival Time: 1105  ASSESSMENT & PLAN:  1. Strain of lumbar region, initial encounter   2. Muscle spasm of back     Meds ordered this encounter  Medications  . cyclobenzaprine (FLEXERIL) 10 MG tablet    Sig: Take 1 tablet (10 mg total) by mouth 3 (three) times daily.    Dispense:  20 tablet    Refill:  0   Continue ibuprofen or Aleve with food for the next 3-5 days. Ensure proper ROM. Will f/u if not showing improvement within 48-72 hours, sooner if needed. Medication sedation precautions. Work note given.  Reviewed expectations re: course of current medical issues. Questions answered. Outlined signs and symptoms indicating need for more acute intervention. Patient verbalized understanding. After Visit Summary given.   SUBJECTIVE:  [name] is a 46 y.o. female who presents with complaint of lower back pain. Acute onset approx 4-5 days ago. Rising from chair and felt "a pull across my lower back." Persistent now. Standing exacerbates. Sitting helps temporarily. No abdominal pain. No LMP recorded. Patient has had a hysterectomy. No LE weakness or sensation changes. Ambulatory without difficulty. No injury or trauma reported. No urinary symptoms. Ibuprofen 800mg  TID has helped some. Now having trouble sleeping secondary to sharp discomfort of lower back. No h/o similar.  ROS: As per HPI.   OBJECTIVE:  Vitals:   03/09/17 1239  BP: 129/81  Pulse: 79  Resp: 18  Temp: 98.7 F (37.1 C)  TempSrc: Oral  SpO2: 100%    General appearance: alert; no distress Lungs: normal respirations Back: tenderness reported over lumbar paraspinal musculature; FROM at hips; no midline tenderness Extremities: no cyanosis or edema; symmetrical with no gross deformities Skin: warm and dry Neurologic: normal gait; normal symmetric reflexes; normal LE strength and sensation Psychological: alert and cooperative; normal mood and  affect  Allergies  Allergen Reactions  . Lotrel [Amlodipine Besy-Benazepril Hcl] Cough    Past Medical History:  Diagnosis Date  . Hypertension   . PVC (premature ventricular contraction) 07/20/2016  . Sinus tachycardia 07/20/2016   Social History   Social History  . Marital status: Married    Spouse name: N/A  . Number of children: N/A  . Years of education: N/A   Occupational History  . Not on file.   Social History Main Topics  . Smoking status: Light Tobacco Smoker    Packs/day: 0.30  . Smokeless tobacco: Never Used     Comment: black-n-milds   . Alcohol use Yes     Comment: occasional   . Drug use: No  . Sexual activity: Yes    Birth control/ protection: Surgical   Other Topics Concern  . Not on file   Social History Narrative  . No narrative on file   Family History  Problem Relation Age of Onset  . Hypertension Mother   . Heart murmur Father   . Ovarian cancer Paternal Grandmother   . Lupus Sister    Past Surgical History:  Procedure Laterality Date  . ABDOMINAL HYSTERECTOMY    . TUBAL LIGATION       Mardella LaymanHagler, Brentley Landfair, MD 03/09/17 1304

## 2017-03-10 NOTE — Telephone Encounter (Signed)
Left detailed msg on VM per HIPAA pt needs to come in for lab only appt to recheck potassium

## 2017-03-12 ENCOUNTER — Other Ambulatory Visit: Payer: Self-pay | Admitting: Internal Medicine

## 2017-03-12 ENCOUNTER — Other Ambulatory Visit: Payer: BC Managed Care – PPO

## 2017-03-12 DIAGNOSIS — T502X5A Adverse effect of carbonic-anhydrase inhibitors, benzothiadiazides and other diuretics, initial encounter: Principal | ICD-10-CM

## 2017-03-12 DIAGNOSIS — E876 Hypokalemia: Secondary | ICD-10-CM

## 2017-03-15 ENCOUNTER — Other Ambulatory Visit: Payer: BC Managed Care – PPO

## 2017-03-19 ENCOUNTER — Other Ambulatory Visit: Payer: Self-pay | Admitting: Internal Medicine

## 2017-03-21 ENCOUNTER — Other Ambulatory Visit: Payer: Self-pay | Admitting: Internal Medicine

## 2017-03-23 ENCOUNTER — Telehealth: Payer: Self-pay | Admitting: Internal Medicine

## 2017-03-23 ENCOUNTER — Encounter: Payer: Self-pay | Admitting: Internal Medicine

## 2017-03-23 ENCOUNTER — Ambulatory Visit (INDEPENDENT_AMBULATORY_CARE_PROVIDER_SITE_OTHER): Payer: BC Managed Care – PPO | Admitting: Internal Medicine

## 2017-03-23 VITALS — BP 134/86 | HR 89 | Temp 98.1°F | Wt 135.0 lb

## 2017-03-23 DIAGNOSIS — R112 Nausea with vomiting, unspecified: Secondary | ICD-10-CM

## 2017-03-23 MED ORDER — PROMETHAZINE HCL 25 MG/ML IJ SOLN
12.5000 mg | Freq: Once | INTRAMUSCULAR | Status: AC
  Administered 2017-03-23: 12.5 mg via INTRAMUSCULAR

## 2017-03-23 MED ORDER — PROMETHAZINE HCL 25 MG RE SUPP: 25.0000 mg | Freq: Four times a day (QID) | RECTAL | 0 refills | Status: DC | PRN

## 2017-03-23 MED ORDER — METOCLOPRAMIDE HCL 5 MG PO TABS: 5.0000 mg | ORAL_TABLET | Freq: Two times a day (BID) | ORAL | 1 refills | Status: DC

## 2017-03-23 NOTE — Patient Instructions (Signed)
Vomiting, Adult  Vomiting occurs when stomach contents are thrown up and out of the mouth. Many people notice nausea before vomiting. Vomiting can make you feel weak and dehydrated. Dehydration can make you tired and thirsty, cause you to have a dry mouth, and decrease how often you urinate. Older adults and people who have other diseases or a weak immune system are at higher risk for dehydration. It is important to treat vomiting as told by your health care provider.  Follow these instructions at home:  Follow your health care provider’s instructions about how to care for yourself at home.  Eating and drinking   Follow these recommendations as told by your health care provider:  · Take an oral rehydration solution (ORS). This is a drink that is sold at pharmacies and retail stores.  · Eat bland, easy-to-digest foods in small amounts as you are able. These foods include bananas, applesauce, rice, lean meats, toast, and crackers.  · Drink clear fluids in small amounts as you are able. Clear fluids include water, ice chips, low-calorie sports drinks, and fruit juice that has water added (diluted fruit juice).  · Avoid fluids that contain a lot of sugar or caffeine.  · Avoid alcohol and foods that are spicy or fatty.    General instructions     · Wash your hands frequently with soap and water. If soap and water are not available, use hand sanitizer. Make sure that everyone in your household washes their hands frequently.  · Take over-the-counter and prescription medicines only as told by your health care provider.  · Watch your condition for any changes.  · Keep all follow-up visits as told by your health care provider. This is important.  Contact a health care provider if:  · You have a fever.  · You are not able to keep fluids down.  · Your vomiting gets worse.  · You have new symptoms.  · You feel light-headed or dizzy.  · You have a headache.  · You have muscle cramps.  Get help right away if:  · You have pain  in your chest, neck, arm, or jaw.  · You feel extremely weak or you faint.  · You have persistent vomiting.  · You have vomit that is bright red or looks like black coffee grounds.  · You have stools that are bloody or black, or stools that look like tar.  · You have severe pain, cramping, or bloating in your abdomen.  · You have a severe headache, a stiff neck, or both.  · You have a rash.  · You have trouble breathing or you are breathing very quickly.  · Your heart is beating very quickly.  · Your skin feels cold and clammy.  · You feel confused.  · You have pain while urinating.  · You have signs of dehydration, such as:  ? Dark urine, or very little or no urine.  ? Cracked lips.  ? Dry mouth.  ? Sunken eyes.  ? Sleepiness.  ? Weakness.  These symptoms may represent a serious problem that is an emergency. Do not wait to see if the symptoms will go away. Get medical help right away. Call your local emergency services (911 in the [place] Do not drive yourself to the hospital.  This information is not intended to replace advice given to you by your health care provider. Make sure you discuss any questions you have with your health care provider.  Document Released: 07/19/2015 Document Revised:   11/28/2015 Document Reviewed: 02/26/2015  Elsevier Interactive Patient Education © 2017 [employer]

## 2017-03-23 NOTE — Addendum Note (Signed)
Addended by: Roena Malady on: 03/23/2017 11:55 AM   Modules accepted: Orders

## 2017-03-23 NOTE — Telephone Encounter (Signed)
Pt has appt 03/23/17 at 11:15 with Pamala Hurry NP.

## 2017-03-23 NOTE — Telephone Encounter (Signed)
[facility] - Client TELEPHONE ADVICE RECORD [facility]  Patient Name: MERY GUADALUPE  DOB: Oct 28, 1970    Initial Comment [name] states her mother has been vomiting. She is wanting to know if she is able to get a suppository called in.   Nurse Assessment  Nurse: Laural Benes, RN, Dondra Spry Date/Time Lamount Cohen Time): 03/23/2017 9:18:02 AM  Confirm and document reason for call. If symptomatic, describe symptoms. ---Marcelino Duster is vomiting this am; has been seen for this sent to ED in August vomiting etiology unknown. gave her phenergan and she can't keep it down is asking for suppository for this. got hot while driving and fingertips went numb.  Does the patient have any new or worsening symptoms? ---Yes  Will a triage be completed? ---Yes  Related visit to physician within the last 2 weeks? ---No  Does the PT have any chronic conditions? (i.e. diabetes, asthma, etc.) ---Unknown  Is the patient pregnant or possibly pregnant? (Ask all females between the ages of 50-55) ---No  Is this a behavioral health or substance abuse call? ---No     Guidelines    Guideline Title Affirmed Question Affirmed Notes  Vomiting Vomiting is a chronic symptom (recurrent or ongoing AND present > 4 weeks)    Final Disposition User   See Physician within 4 Hours (or PCP triage) Laural Benes, RN, Dondra Spry    Comments  NOTE: appointment time given 1115am with W. NP Baity for recurring vomiting abdominal pain   Referrals  REFERRED TO PCP OFFICE   Disagree/Comply: Comply

## 2017-03-23 NOTE — Progress Notes (Signed)
Subjective:    Patient ID: [name], female    DOB: 10-22-1970, 46 y.o.   MRN: [id number]  HPI  Pt presents to the clinic today with c/o persistent nausea and vomiting. She reports this has been going on for months.   12/13/16: Seen in the ER for the same. RUQ ultrasound negative. She was started on Prilosec at that time  02/05/17: Seen by Dr. Morton Stall. Referred to GI at that time. Given Promethazine injection and RX to take home. She ended up going to the ER, was admitted overnight for potassium replacement.  At this time, she denies abdominal pain but woke up this morning with nausea and vomiting. She is not sure what is causing this. Her symptoms come and go. She can not correlate it with specific foods. She reports she has some loose stools but denies constipation or blood in her stool. She has an appt with GI next month.  Review of Systems      Past Medical History:  Diagnosis Date  . Hypertension   . PVC (premature ventricular contraction) 07/20/2016  . Sinus tachycardia 07/20/2016    Current Outpatient Prescriptions  Medication Sig Dispense Refill  . amLODipine (NORVASC) 10 MG tablet TAKE 1 TABLET(10 MG) BY MOUTH DAILY 30 tablet 4  . atorvastatin (LIPITOR) 10 MG tablet TAKE 1 TABLET(10 MG) BY MOUTH DAILY 30 tablet 4  . cyclobenzaprine (FLEXERIL) 10 MG tablet Take 1 tablet (10 mg total) by mouth 3 (three) times daily. 20 tablet 0  . hydrochlorothiazide (HYDRODIURIL) 25 MG tablet TAKE 1 TABLET(25 MG) BY MOUTH DAILY 30 tablet 8  . potassium chloride (K-DUR) 10 MEQ tablet TAKE 1 TABLET(10 MEQ) BY MOUTH TWICE DAILY 60 tablet 0  . promethazine (PHENERGAN) 25 MG tablet Take 0.5-1 tablets (12.5-25 mg total) by mouth every 8 (eight) hours as needed for nausea or vomiting. 20 tablet 0  . venlafaxine XR (EFFEXOR-XR) 75 MG 24 hr capsule TAKE 1 CAPSULE(75 MG) BY MOUTH DAILY WITH BREAKFAST 30 capsule 1  . metoCLOPramide (REGLAN) 5 MG tablet Take 1 tablet (5 mg total) by mouth 2  (two) times daily. 60 tablet 1  . promethazine (PHENERGAN) 25 MG suppository Place 1 suppository (25 mg total) rectally every 6 (six) hours as needed for nausea or vomiting. 12 each 0   No current facility-administered medications for this visit.     Allergies  Allergen Reactions  . Lotrel [Amlodipine Besy-Benazepril Hcl] Cough    Family History  Problem Relation Age of Onset  . Hypertension Mother   . Heart murmur Father   . Ovarian cancer Paternal Grandmother   . Lupus Sister     Social History   Social History  . Marital status: Married    Spouse name: N/A  . Number of children: N/A  . Years of education: N/A   Occupational History  . Not on file.   Social History Main Topics  . Smoking status: Light Tobacco Smoker    Packs/day: 0.30  . Smokeless tobacco: Never Used     Comment: black-n-milds   . Alcohol use Yes     Comment: occasional   . Drug use: No  . Sexual activity: Yes    Birth control/ protection: Surgical   Other Topics Concern  . Not on file   Social History Narrative  . No narrative on file     Constitutional: Denies fever, malaise, fatigue, headache or abrupt weight changes.  Gastrointestinal: Pt repots nausea and vomiting. Denies abdominal pain, bloating,  constipation, diarrhea or blood in the stool.  GU: Denies urgency, frequency, pain with urination, burning sensation, blood in urine, odor or discharge. No other specific complaints in a complete review of systems (except as listed in HPI above).  Objective:   Physical Exam  BP 134/86   Pulse 89   Temp 98.1 F (36.7 C) (Oral)   Wt 135 lb (61.2 kg)   SpO2 98%   BMI 23.17 kg/m  Wt Readings from Last 3 Encounters:  03/23/17 135 lb (61.2 kg)  02/05/17 132 lb (59.9 kg)  02/05/17 132 lb 1.9 oz (59.9 kg)    General: Appears her stated age, in NAD. Abdomen: Soft and nontender. Hyperactive bowel sounds. No distention or masses noted.    BMET    Component Value Date/Time   NA 137  02/06/2017 0548   K 3.2 (L) 02/06/2017 0548   CL 103 02/06/2017 0548   CO2 28 02/06/2017 0548   GLUCOSE 107 (H) 02/06/2017 0548   BUN 5 (L) 02/06/2017 0548   CREATININE 0.77 02/06/2017 0548   CALCIUM 8.6 (L) 02/06/2017 0548   GFRNONAA >60 02/06/2017 0548   GFRAA >60 02/06/2017 0548    Lipid Panel     Component Value Date/Time   CHOL 154 08/28/2016 1016   TRIG 112.0 08/28/2016 1016   HDL 56.60 08/28/2016 1016   CHOLHDL 3 08/28/2016 1016   VLDL 22.4 08/28/2016 1016   LDLCALC 75 08/28/2016 1016    CBC    Component Value Date/Time   WBC 11.6 (H) 02/06/2017 0548   RBC 4.14 02/06/2017 0548   HGB 12.2 02/06/2017 0548   HGB 12.3 05/10/2007 1305   HCT 36.5 02/06/2017 0548   HCT 35.8 05/10/2007 1305   PLT 397 02/06/2017 0548   PLT 515 (H) 05/10/2007 1305   MCV 88.2 02/06/2017 0548   MCV 82.8 05/10/2007 1305   MCH 29.5 02/06/2017 0548   MCHC 33.4 02/06/2017 0548   RDW 15.6 (H) 02/06/2017 0548   RDW 15.1 (H) 05/10/2007 1305   LYMPHSABS 2.4 09/09/2014 1054   LYMPHSABS 2.4 05/10/2007 1305   MONOABS 0.8 09/09/2014 1054   MONOABS 0.4 05/10/2007 1305   EOSABS 0.0 09/09/2014 1054   EOSABS 0.1 05/10/2007 1305   BASOSABS 0.0 09/09/2014 1054   BASOSABS 0.0 05/10/2007 1305    Hgb A1C No results found for: HGBA1C          Assessment & Plan:   Persistent Nausea and Vomiting:  She does not feel like Prilosec helps and wants to stop, will d/c Will trial Reglan 5 mg BID Continue Promethazine 25 mg Q8H prn Prometahizine 12.5 mg IM today Follow up with GI  Return precautions discussed Nicki Reaper, NP

## 2017-04-04 ENCOUNTER — Other Ambulatory Visit: Payer: Self-pay | Admitting: Internal Medicine

## 2017-04-05 ENCOUNTER — Ambulatory Visit (INDEPENDENT_AMBULATORY_CARE_PROVIDER_SITE_OTHER): Payer: BC Managed Care – PPO | Admitting: Internal Medicine

## 2017-04-05 ENCOUNTER — Encounter: Payer: Self-pay | Admitting: Internal Medicine

## 2017-04-05 ENCOUNTER — Telehealth: Payer: Self-pay | Admitting: Internal Medicine

## 2017-04-05 VITALS — BP 128/78 | HR 103 | Temp 98.0°F | Wt 136.5 lb

## 2017-04-05 DIAGNOSIS — K219 Gastro-esophageal reflux disease without esophagitis: Secondary | ICD-10-CM | POA: Diagnosis not present

## 2017-04-05 DIAGNOSIS — E876 Hypokalemia: Secondary | ICD-10-CM | POA: Diagnosis not present

## 2017-04-05 DIAGNOSIS — R142 Eructation: Secondary | ICD-10-CM | POA: Diagnosis not present

## 2017-04-05 DIAGNOSIS — T502X5A Adverse effect of carbonic-anhydrase inhibitors, benzothiadiazides and other diuretics, initial encounter: Secondary | ICD-10-CM | POA: Diagnosis not present

## 2017-04-05 DIAGNOSIS — Z23 Encounter for immunization: Secondary | ICD-10-CM | POA: Diagnosis not present

## 2017-04-05 LAB — BASIC METABOLIC PANEL
BUN: 10 mg/dL (ref 6–23)
CALCIUM: 9.7 mg/dL (ref 8.4–10.5)
CO2: 29 mEq/L (ref 19–32)
CREATININE: 0.62 mg/dL (ref 0.40–1.20)
Chloride: 98 mEq/L (ref 96–112)
GFR: 132.53 mL/min (ref >60.00)
Glucose, Bld: 90 mg/dL (ref 70–99)
Potassium: 3 mEq/L — ABNORMAL LOW (ref 3.5–5.1)
Sodium: 139 mEq/L (ref 135–145)

## 2017-04-05 LAB — H. PYLORI ANTIBODY, IGG: H PYLORI IGG: NEGATIVE

## 2017-04-05 NOTE — Progress Notes (Signed)
Subjective:    Patient ID: [name], female    DOB: [birth year], 46 y.o.   MRN: [id number]  HPI  Pt presents to the clinic today with persistent GI complaints. She was seen 8/3, 9/18 and had ER visits for the same. She reports reflux, belching and epigastric pain. The epigastric pain radiates into her back. She reports her symptoms improve after she belches. She has had a normal abdominal Ultrasound done in the ER. She felt like Prilosec did not work for her, but has noticed that she is coughing more since she stopped it. She denies shortness of breath. She did not take the Reglan that I recommended at the last visit, secondary to possible side effects. She is taking Zantac 150 mg BID, but reports that is not effective. She has an appt with GI next week.  She also needs a refill of her Potassium, but reports she is due to have her potassium repeated today.  Review of Systems      Past Medical History:  Diagnosis Date  . Hypertension   . PVC (premature ventricular contraction) 07/20/2016  . Sinus tachycardia 07/20/2016    Current Outpatient Prescriptions  Medication Sig Dispense Refill  . amLODipine (NORVASC) 10 MG tablet TAKE 1 TABLET(10 MG) BY MOUTH DAILY 30 tablet 4  . atorvastatin (LIPITOR) 10 MG tablet TAKE 1 TABLET(10 MG) BY MOUTH DAILY 30 tablet 4  . cyclobenzaprine (FLEXERIL) 10 MG tablet Take 1 tablet (10 mg total) by mouth 3 (three) times daily. 20 tablet 0  . hydrochlorothiazide (HYDRODIURIL) 25 MG tablet TAKE 1 TABLET(25 MG) BY MOUTH DAILY 30 tablet 8  . metoCLOPramide (REGLAN) 5 MG tablet Take 1 tablet (5 mg total) by mouth 2 (two) times daily. 60 tablet 1  . potassium chloride (K-DUR) 10 MEQ tablet TAKE 1 TABLET(10 MEQ) BY MOUTH TWICE DAILY 60 tablet 0  . promethazine (PHENERGAN) 25 MG suppository Place 1 suppository (25 mg total) rectally every 6 (six) hours as needed for nausea or vomiting. 12 each 0  . promethazine (PHENERGAN) 25 MG tablet Take 0.5-1 tablets  (12.5-25 mg total) by mouth every 8 (eight) hours as needed for nausea or vomiting. 20 tablet 0  . venlafaxine XR (EFFEXOR-XR) 75 MG 24 hr capsule TAKE 1 CAPSULE(75 MG) BY MOUTH DAILY WITH BREAKFAST 30 capsule 1   No current facility-administered medications for this visit.     Allergies  Allergen Reactions  . Lotrel [Amlodipine Besy-Benazepril Hcl] Cough    Family History  Problem Relation Age of Onset  . Hypertension Mother   . Heart murmur Father   . Ovarian cancer Paternal Grandmother   . Lupus Sister     Social History   Social History  . Marital status: Married    Spouse name: N/A  . Number of children: N/A  . Years of education: N/A   Occupational History  . Not on file.   Social History Main Topics  . Smoking status: Light Tobacco Smoker    Packs/day: 0.30  . Smokeless tobacco: Never Used     Comment: black-n-milds   . Alcohol use Yes     Comment: occasional   . Drug use: No  . Sexual activity: Yes    Birth control/ protection: Surgical   Other Topics Concern  . Not on file   Social History Narrative  . No narrative on file     Constitutional: Denies fever, malaise, fatigue, headache or abrupt weight changes.  Respiratory: Pt reports cough. Denies difficulty  breathing, shortness of breath, or sputum production.   Cardiovascular: Denies chest pain, chest tightness, palpitations or swelling in the hands or feet.  Gastrointestinal: Pt reports epigastric pain and belching. Denies bloating, constipation, diarrhea or blood in the stool.   No other specific complaints in a complete review of systems (except as listed in HPI above).  Objective:   Physical Exam  BP 128/78   Pulse (!) 103   Temp 98 F (36.7 C) (Oral)   Wt 136 lb 8 oz (61.9 kg)   SpO2 98%   BMI 23.43 kg/m  Wt Readings from Last 3 Encounters:  04/05/17 136 lb 8 oz (61.9 kg)  03/23/17 135 lb (61.2 kg)  02/05/17 132 lb (59.9 kg)    General: Appears her stated age, in  NAD. Cardiovascular: Tachycardic with normal rhythm. S1,S2 noted.  No murmur, rubs or gallops noted. No JVD or BLE edema. Pulmonary/Chest: Normal effort and positive vesicular breath sounds. No respiratory distress. No wheezes, rales or ronchi noted.  Abdomen: Soft and mildly tender in the epigastric region. Normal bowel sounds. No distention or masses noted.    BMET    Component Value Date/Time   NA 137 02/06/2017 0548   K 3.2 (L) 02/06/2017 0548   CL 103 02/06/2017 0548   CO2 28 02/06/2017 0548   GLUCOSE 107 (H) 02/06/2017 0548   BUN 5 (L) 02/06/2017 0548   CREATININE 0.77 02/06/2017 0548   CALCIUM 8.6 (L) 02/06/2017 0548   GFRNONAA >60 02/06/2017 0548   GFRAA >60 02/06/2017 0548    Lipid Panel     Component Value Date/Time   CHOL 154 08/28/2016 1016   TRIG 112.0 08/28/2016 1016   HDL 56.60 08/28/2016 1016   CHOLHDL 3 08/28/2016 1016   VLDL 22.4 08/28/2016 1016   LDLCALC 75 08/28/2016 1016    CBC    Component Value Date/Time   WBC 11.6 (H) 02/06/2017 0548   RBC 4.14 02/06/2017 0548   HGB 12.2 02/06/2017 0548   HGB 12.3 05/10/2007 1305   HCT 36.5 02/06/2017 0548   HCT 35.8 05/10/2007 1305   PLT 397 02/06/2017 0548   PLT 515 (H) 05/10/2007 1305   MCV 88.2 02/06/2017 0548   MCV 82.8 05/10/2007 1305   MCH 29.5 02/06/2017 0548   MCHC 33.4 02/06/2017 0548   RDW 15.6 (H) 02/06/2017 0548   RDW 15.1 (H) 05/10/2007 1305   LYMPHSABS 2.4 09/09/2014 1054   LYMPHSABS 2.4 05/10/2007 1305   MONOABS 0.8 09/09/2014 1054   MONOABS 0.4 05/10/2007 1305   EOSABS 0.0 09/09/2014 1054   EOSABS 0.1 05/10/2007 1305   BASOSABS 0.0 09/09/2014 1054   BASOSABS 0.0 05/10/2007 1305    Hgb A1C No results found for: HGBA1C          Assessment & Plan:   GERD, Belching:  Will check H Pylori Advised her to try Prilosec in am and Zantac in pm She already has an appt with GI so I advised her to make sure she follows up with them  Diuretic Induced Hypokalemia:  CMET today Will  adjust and refill Potassium as needed based on labs. Will follow up after labs, return precautions discussed Nicki Reaper, NP

## 2017-04-05 NOTE — Telephone Encounter (Signed)
Patient Name: [name]  DOB: [birth year]    Initial Comment pain in shoulder blades, a lot of belching, has to sleep propped up   Nurse Assessment  Nurse: Deatra James RN, Corrie Dandy Date/Time Lamount Cohen Time): 04/05/2017 9:11:32 AM  Confirm and document reason for call. If symptomatic, describe symptoms. ---Patient states she is having a lot of reflux right now and she is waking up from sleep and she has to prop up on pillow to sleep states she has pain between the blades blades with this  Does the patient have any new or worsening symptoms? ---Yes  Will a triage be completed? ---Yes  Related visit to physician within the last 2 weeks? ---No  Does the PT have any chronic conditions? (i.e. diabetes, asthma, etc.) ---Yes  List chronic conditions. ---"HTN, high cholesterol"  Is the patient pregnant or possibly pregnant? (Ask all females between the ages of 63-55) ---No  Is this a behavioral health or substance abuse call? ---No     Guidelines    Guideline Title Affirmed Question Affirmed Notes  Chest Pain Taking a deep breath makes pain worse    Final Disposition User   Go to ED Now (or PCP triage) Deatra James, RN, [facility]    Referrals  REFERRED TO PCP OFFICE   [name] Disagree/Comply Comply  [name] Understands Yes  PreDisposition Go to Urgent Care/Walk-In Clinic

## 2017-04-05 NOTE — Patient Instructions (Signed)
Food Choices for Gastroesophageal Reflux Disease, Adult When you have gastroesophageal reflux disease (GERD), the foods you eat and your eating habits are very important. Choosing the right foods can help ease your discomfort. What guidelines do I need to follow?  Choose fruits, vegetables, whole grains, and low-fat dairy products.  Choose low-fat meat, fish, and poultry.  Limit fats such as oils, salad dressings, butter, nuts, and avocado.  Keep a food diary. This helps you identify foods that cause symptoms.  Avoid foods that cause symptoms. These may be different for everyone.  Eat small meals often instead of 3 large meals a day.  Eat your meals slowly, in a place where you are relaxed.  Limit fried foods.  Cook foods using methods other than frying.  Avoid drinking alcohol.  Avoid drinking large amounts of liquids with your meals.  Avoid bending over or lying down until 2-3 hours after eating. What foods are not recommended? These are some foods and drinks that may make your symptoms worse: Vegetables  Tomatoes. Tomato juice. Tomato and spaghetti sauce. Chili peppers. Onion and garlic. Horseradish. Fruits  Oranges, grapefruit, and lemon (fruit and juice). Meats  High-fat meats, fish, and poultry. This includes hot dogs, ribs, ham, sausage, salami, and bacon. Dairy  Whole milk and chocolate milk. Sour cream. Cream. Butter. Ice cream. Cream cheese. Drinks  Coffee and tea. Bubbly (carbonated) drinks or energy drinks. Condiments  Hot sauce. Barbecue sauce. Sweets/Desserts  Chocolate and cocoa. Donuts. Peppermint and spearmint. Fats and Oils  High-fat foods. This includes French fries and potato chips. Other  Vinegar. Strong spices. This includes black pepper, white pepper, red pepper, cayenne, curry powder, cloves, ginger, and chili powder. The items listed above may not be a complete list of foods and drinks to avoid. Contact your dietitian for more information.    This information is not intended to replace advice given to you by your health care provider. Make sure you discuss any questions you have with your health care provider. Document Released: 12/22/2011 Document Revised: 11/28/2015 Document Reviewed: 04/26/2013 Elsevier Interactive Patient Education  2017 [employer]  

## 2017-04-05 NOTE — Telephone Encounter (Signed)
Pt has appt to see R Baity NP 04/05/17 at 11:30.

## 2017-04-05 NOTE — Addendum Note (Signed)
Addended by: Roena Malady on: 04/05/2017 04:53 PM   Modules accepted: Orders

## 2017-04-06 MED ORDER — POTASSIUM CHLORIDE CRYS ER 20 MEQ PO TBCR: 20.0000 meq | EXTENDED_RELEASE_TABLET | Freq: Two times a day (BID) | ORAL | 1 refills | Status: DC

## 2017-04-06 NOTE — Addendum Note (Signed)
Addended by: Roena Malady on: 04/06/2017 06:01 PM   Modules accepted: Orders

## 2017-04-13 ENCOUNTER — Other Ambulatory Visit (INDEPENDENT_AMBULATORY_CARE_PROVIDER_SITE_OTHER): Payer: BC Managed Care – PPO

## 2017-04-13 ENCOUNTER — Encounter: Payer: Self-pay | Admitting: Gastroenterology

## 2017-04-13 ENCOUNTER — Ambulatory Visit (INDEPENDENT_AMBULATORY_CARE_PROVIDER_SITE_OTHER): Payer: BC Managed Care – PPO | Admitting: Gastroenterology

## 2017-04-13 VITALS — BP 122/78 | HR 93 | Ht 64.0 in | Wt 137.8 lb

## 2017-04-13 DIAGNOSIS — R1013 Epigastric pain: Secondary | ICD-10-CM | POA: Diagnosis not present

## 2017-04-13 DIAGNOSIS — R197 Diarrhea, unspecified: Secondary | ICD-10-CM | POA: Diagnosis not present

## 2017-04-13 DIAGNOSIS — G43A0 Cyclical vomiting, not intractable: Secondary | ICD-10-CM

## 2017-04-13 LAB — CBC WITH DIFFERENTIAL/PLATELET
Basophils Absolute: 0.1 10*3/uL (ref 0.0–0.1)
Basophils Relative: 0.4 % (ref 0.0–3.0)
EOS ABS: 0.1 10*3/uL (ref 0.0–0.7)
Eosinophils Relative: 0.6 % (ref 0.0–5.0)
HCT: 37.7 % (ref 36.0–46.0)
Hemoglobin: 12.8 g/dL (ref 12.0–15.0)
LYMPHS ABS: 3.1 10*3/uL (ref 0.7–4.0)
LYMPHS PCT: 24.2 % (ref 12.0–46.0)
MCHC: 33.9 g/dL (ref 30.0–36.0)
MCV: 90.4 fl (ref 78.0–100.0)
MONO ABS: 0.6 10*3/uL (ref 0.1–1.0)
Monocytes Relative: 5 % (ref 3.0–12.0)
NEUTROS PCT: 69.8 % (ref 43.0–77.0)
Neutro Abs: 9 10*3/uL — ABNORMAL HIGH (ref 1.4–7.7)
Platelets: 500 10*3/uL — ABNORMAL HIGH (ref 150.0–400.0)
RBC: 4.17 Mil/uL (ref 3.87–5.11)
RDW: 14.8 % (ref 11.5–15.5)
WBC: 13 10*3/uL — AB (ref 4.0–10.5)

## 2017-04-13 NOTE — Patient Instructions (Signed)
If you are age 47 or older, your body mass index should be between 23-30. Your Body mass index is 23.65 kg/m. If this is out of the aforementioned range listed, please consider follow up with your Primary Care Provider.  If you are age 3 or younger, your body mass index should be between 19-25. Your Body mass index is 23.65 kg/m. If this is out of the aformentioned range listed, please consider follow up with your Primary Care Provider.   You have been scheduled for a CT scan of the abdomen and pelvis at [facility] are scheduled on 04-22-2017 at [place] should arrive at 730 am. Please follow the written instructions below on the day of your exam:  WARNING: IF YOU ARE ALLERGIC TO IODINE/X-RAY DYE, PLEASE NOTIFY RADIOLOGY IMMEDIATELY AT [phone]! YOU WILL BE GIVEN A 13 HOUR PREMEDICATION PREP.  1) Do not eat or drink anything after Midnight.  You may take any medications as prescribed with a small amount of water except for the following: Metformin, Glucophage, Glucovance, Avandamet, Riomet, Fortamet, Actoplus Met, Janumet, Glumetza or Metaglip. The above medications must be held the day of the exam AND 48 hours after the exam.  The purpose of you drinking the oral contrast is to aid in the visualization of your intestinal tract. The contrast solution may cause some diarrhea. Before your exam is started, you will be given a small amount of fluid to drink. Depending on your individual set of symptoms, you may also receive an intravenous injection of x-ray contrast/dye.   If you have any questions regarding your exam or if you need to reschedule, you may call the CT department at [phone] between the hours of 8:00 am and 5:00 pm, Monday-Friday.  ________________________________________________________________________  Thank you for choosing [facility]  Dr Wilfrid Lund III

## 2017-04-13 NOTE — Progress Notes (Signed)
[facility] Consult Note:  History: [name] 04/13/2017  Referring physician: Lorre Munroe, NP  Reason for consult/chief complaint: Abdominal Pain (Epigastric pain with vomiting x 1 year); Gastroesophageal Reflux; Nausea; and Constipation   Subjective  HPI:  This is a 46 year old woman referred by primary care noted above for ongoing abdominal pain with episodic vomiting. She reports that this all started in 2009, when she was hospitalized with May and June of that year. Limited available records indicate that she was seen by the [facility] group, and a CT scan suggested a long segment of jejunal thickening. Upper endoscopy was normal, and that is the last time she was seen by them or any GI practice. Halah reports a fairly constant dull ache in the epigastrium, then every 3 or 4 weeks has episodes of intense worsening of the pain with protracted vomiting that have brought her to the ED on multiple occasions. She was hospitalized for this in August of this year, does not appear that she was seen by GI consultant. She was given symptomatic treatment and discharged home. No abdominal imaging was done during that admission. She also has intermittent severe it is nonexertional and got better on Zantac. She denies dysphagia odynophagia or weight loss. She typically has a BM about twice a week, but during episodes of this pain she might have multiple loose stools. She denies rectal bleeding, and with these episodes of pain and vomiting does not have a rash or facial/tongue/lip swelling.   ROS:  Review of Systems  Constitutional: Negative for appetite change and unexpected weight change.  HENT: Negative for mouth sores and voice change.   Eyes: Negative for pain and redness.  Respiratory: Negative for cough and shortness of breath.   Cardiovascular: Negative for chest pain and palpitations.  Genitourinary: Negative for dysuria and hematuria.  Musculoskeletal:  Negative for arthralgias and myalgias.  Skin: Negative for pallor and rash.  Neurological: Negative for weakness and headaches.  Hematological: Negative for adenopathy.     Past Medical History: Past Medical History:  Diagnosis Date  . Hypertension   . PVC (premature ventricular contraction) 07/20/2016  . Sinus tachycardia 07/20/2016     Past Surgical History: Past Surgical History:  Procedure Laterality Date  . ABDOMINAL HYSTERECTOMY    . TUBAL LIGATION       Family History: Family History  Problem Relation Age of Onset  . Hypertension Mother   . Heart murmur Father   . Ovarian cancer Paternal Grandmother   . Lupus Sister     Social History: Social History   Social History  . Marital status: Married    Spouse name: N/A  . Number of children: N/A  . Years of education: N/A   Social History Main Topics  . Smoking status: Light Tobacco Smoker    Packs/day: 0.30  . Smokeless tobacco: Never Used     Comment: black-n-milds   . Alcohol use Yes     Comment: occasional   . Drug use: No  . Sexual activity: Yes    Birth control/ protection: Surgical   Other Topics Concern  . None   Social History Narrative  . None    Allergies: Allergies  Allergen Reactions  . Lotrel [Amlodipine Besy-Benazepril Hcl] Cough    Outpatient Meds: Current Outpatient Prescriptions  Medication Sig Dispense Refill  . amLODipine (NORVASC) 10 MG tablet TAKE 1 TABLET(10 MG) BY MOUTH DAILY 30 tablet 0  . atorvastatin (LIPITOR) 10 MG tablet TAKE 1  TABLET(10 MG) BY MOUTH DAILY 30 tablet 0  . cyclobenzaprine (FLEXERIL) 10 MG tablet Take 1 tablet (10 mg total) by mouth 3 (three) times daily. 20 tablet 0  . hydrochlorothiazide (HYDRODIURIL) 25 MG tablet TAKE 1 TABLET(25 MG) BY MOUTH DAILY 30 tablet 8  . potassium chloride SA (K-DUR,KLOR-CON) 20 MEQ tablet Take 1 tablet (20 mEq total) by mouth 2 (two) times daily. 60 tablet 1  . promethazine (PHENERGAN) 25 MG suppository Place 1 suppository  (25 mg total) rectally every 6 (six) hours as needed for nausea or vomiting. 12 each 0  . promethazine (PHENERGAN) 25 MG tablet Take 0.5-1 tablets (12.5-25 mg total) by mouth every 8 (eight) hours as needed for nausea or vomiting. 20 tablet 0  . venlafaxine XR (EFFEXOR-XR) 75 MG 24 hr capsule TAKE 1 CAPSULE(75 MG) BY MOUTH DAILY WITH BREAKFAST 30 capsule 1   No current facility-administered medications for this visit.      She was prescribed metoclopramide by primary care, but decided not to start it out of concern for possible side effects. ___________________________________________________________________ Objective   Exam:  BP 122/78   Pulse 93   Ht  (1.626 m)   Wt 137 lb 12.8 oz (62.5 kg)   SpO2 97%   BMI 23.65 kg/m    General: this is a Well-appearing woman with normal vocal quality   Eyes: sclera anicteric, no redness  ENT: oral mucosa moist without lesions, no cervical or supraclavicular lymphadenopathy, good dentition  CV: RRR without murmur, S1/S2, no JVD, no peripheral edema  Resp: clear to auscultation bilaterally, normal RR and effort noted  GI: soft, No  tenderness, with active bowel sounds. No guarding or palpable organomegaly noted.No Distention   Skin; warm and dry, no rash or jaundice noted  Neuro: awake, alert and oriented x 3. Normal gross motor function and fluent speech  Labs:  CMP Latest Ref Rng & Units 04/05/2017 02/06/2017 02/05/2017  Glucose 70 - 99 mg/dL 90 161(W) -  BUN 6 - 23 mg/dL 10 5(L) -  Creatinine 9.60 - 1.20 mg/dL 4.54 0.98 1.19  Sodium 135 - 145 mEq/L 139 137 -  Potassium 3.5 - 5.1 mEq/L 3.0(L) 3.2(L) -  Chloride 96 - 112 mEq/L 98 103 -  CO2 19 - 32 mEq/L 29 28 -  Calcium 8.4 - 10.5 mg/dL 9.7 1.4(N) -  Total Protein 6.5 - 8.1 g/dL - 7.2 -  Total Bilirubin 0.3 - 1.2 mg/dL - 0.8 -  Alkaline Phos 38 - 126 U/L - 77 -  AST 15 - 41 U/L - 26 -  ALT 14 - 54 U/L - 25 -   CBC Latest Ref Rng & Units 02/06/2017 02/05/2017 02/05/2017  WBC 4.0  - 10.5 K/uL 11.6(H) 16.0(H) 19.1(H)  Hemoglobin 12.0 - 15.0 g/dL 82.9 56.2 13.0  Hematocrit 36.0 - 46.0 % 36.5 36.1 38.1  Platelets 150 - 400 K/uL 397 408(H) 430(H)     Radiologic Studies:  See 12/2007 and 11/2007 CTAP reports with extensive SB thickening and pelvic ascites  June 2018  Normal GB ultrasound  Assessment: Encounter Diagnoses  Name Primary?  . Epigastric pain Yes  . Cyclical vomiting with nausea, intractability of vomiting not specified   . Diarrhea, unspecified type   Over 9 years of a puzzling constellation of symptoms. It is presently unknown whether the small bowel thickening seen on the initial CT scan related to the process, or is even still going on. If so, it could represent small bowel Crohn's, eosinophilic  enteritis , less likely angioedema without associated symptoms. If that is not still occurring, it could be a primary gastric dysrhythmia/gastroparesis, less likely obstruction. If so, it could have been triggered by some initial infection in 2009.   Plan: CBC with differential rule out peripheral eosinophilia CT enterography Probable upper endoscopy versus enteroscopy, decision to be made after CT report.  Thank you for the courtesy of this consult.  Please call me with any questions or concerns.  Charlie Pitter III  CC: Lorre Munroe, NP

## 2017-04-20 ENCOUNTER — Telehealth: Payer: Self-pay | Admitting: *Deleted

## 2017-04-20 DIAGNOSIS — T502X5A Adverse effect of carbonic-anhydrase inhibitors, benzothiadiazides and other diuretics, initial encounter: Principal | ICD-10-CM

## 2017-04-20 DIAGNOSIS — E876 Hypokalemia: Secondary | ICD-10-CM

## 2017-04-20 MED ORDER — POTASSIUM CHLORIDE CRYS ER 20 MEQ PO TBCR: 20.0000 meq | EXTENDED_RELEASE_TABLET | Freq: Two times a day (BID) | ORAL | 1 refills | Status: DC

## 2017-04-20 NOTE — Telephone Encounter (Signed)
Patient left a voicemail stating that she was in two weeks ago and a new script was to be sent to [facility] for her new dose of potassium. Patient stated that you were increasing her dose and she is almost out. Patient stated that she called [facility] and was advised that they have not gotten the new script.

## 2017-04-20 NOTE — Telephone Encounter (Signed)
This was sent to [facility] on 10/2. Mel can you check on this.

## 2017-04-20 NOTE — Addendum Note (Signed)
Addended by: Roena Malady on: 04/20/2017 04:26 PM   Modules accepted: Orders

## 2017-04-20 NOTE — Telephone Encounter (Signed)
Rx resent to [facility] as it was previously sent to [facility]

## 2017-04-22 ENCOUNTER — Other Ambulatory Visit: Payer: BC Managed Care – PPO

## 2017-04-22 ENCOUNTER — Ambulatory Visit (HOSPITAL_COMMUNITY)
Admission: RE | Admit: 2017-04-22 | Discharge: 2017-04-22 | Disposition: A | Discharge status: Home / Self Care | Payer: BC Managed Care – PPO | Source: Ambulatory Visit | Attending: Gastroenterology | Admitting: Gastroenterology

## 2017-04-22 DIAGNOSIS — R197 Diarrhea, unspecified: Secondary | ICD-10-CM | POA: Diagnosis not present

## 2017-04-22 DIAGNOSIS — R1013 Epigastric pain: Secondary | ICD-10-CM | POA: Diagnosis not present

## 2017-04-22 MED ORDER — IOPAMIDOL (ISOVUE-300) INJECTION 61%
100.0000 mL | Freq: Once | INTRAVENOUS | Status: AC | PRN
  Administered 2017-04-22: 100 mL via INTRAVENOUS

## 2017-04-22 MED ORDER — IOPAMIDOL (ISOVUE-300) INJECTION 61%
INTRAVENOUS | Status: AC
  Filled 2017-04-22: qty 100

## 2017-04-22 MED ORDER — BARIUM SULFATE 0.1 % PO SUSP: 450.0000 mL | Freq: Once | ORAL | Status: DC

## 2017-04-22 MED ORDER — BARIUM SULFATE 0.1 % PO SUSP
ORAL | Status: AC
  Filled 2017-04-22: qty 3

## 2017-04-23 ENCOUNTER — Other Ambulatory Visit: Payer: Self-pay | Admitting: Internal Medicine

## 2017-04-23 DIAGNOSIS — I10 Essential (primary) hypertension: Secondary | ICD-10-CM

## 2017-04-23 NOTE — Telephone Encounter (Signed)
Pt took last K and [facility] does not have rx sent on 04/20/17; I called [facility] and they did receive 04/20/17 K rx but increased dosage and having issues with ins. Trying to resolve ins issues now. Pt voiced understanding and will contact pharmacy this afternoon to make sure can get med today.

## 2017-04-27 ENCOUNTER — Other Ambulatory Visit (INDEPENDENT_AMBULATORY_CARE_PROVIDER_SITE_OTHER): Payer: BC Managed Care – PPO

## 2017-04-27 DIAGNOSIS — T502X5A Adverse effect of carbonic-anhydrase inhibitors, benzothiadiazides and other diuretics, initial encounter: Secondary | ICD-10-CM

## 2017-04-27 DIAGNOSIS — E876 Hypokalemia: Secondary | ICD-10-CM

## 2017-04-27 LAB — POTASSIUM: POTASSIUM: 4.9 meq/L (ref 3.5–5.1)

## 2017-05-06 ENCOUNTER — Ambulatory Visit (AMBULATORY_SURGERY_CENTER): Payer: Self-pay

## 2017-05-06 VITALS — Ht 63.5 in | Wt 134.8 lb

## 2017-05-06 DIAGNOSIS — R1013 Epigastric pain: Secondary | ICD-10-CM

## 2017-05-06 NOTE — Progress Notes (Signed)
Denies allergies to eggs or soy products. Denies complication of anesthesia or sedation. Denies use of weight loss medication. Denies use of O2.   Emmi instructions given for colonoscopy.  

## 2017-05-07 ENCOUNTER — Encounter: Payer: Self-pay | Admitting: Gastroenterology

## 2017-05-07 ENCOUNTER — Telehealth: Payer: Self-pay | Admitting: Gastroenterology

## 2017-05-07 NOTE — Telephone Encounter (Signed)
A user error has taken place: ERROR °

## 2017-05-14 ENCOUNTER — Encounter: Payer: BC Managed Care – PPO | Admitting: Gastroenterology

## 2017-05-29 ENCOUNTER — Other Ambulatory Visit: Payer: Self-pay | Admitting: Internal Medicine

## 2017-06-22 ENCOUNTER — Other Ambulatory Visit: Payer: Self-pay | Admitting: Internal Medicine

## 2017-06-22 DIAGNOSIS — E876 Hypokalemia: Secondary | ICD-10-CM

## 2017-06-22 DIAGNOSIS — T502X5A Adverse effect of carbonic-anhydrase inhibitors, benzothiadiazides and other diuretics, initial encounter: Principal | ICD-10-CM

## 2017-06-28 ENCOUNTER — Other Ambulatory Visit: Payer: Self-pay | Admitting: Internal Medicine

## 2017-07-02 ENCOUNTER — Other Ambulatory Visit: Payer: Self-pay | Admitting: Internal Medicine

## 2017-07-21 ENCOUNTER — Encounter: Payer: BC Managed Care – PPO | Admitting: Internal Medicine

## 2017-07-21 NOTE — Progress Notes (Deleted)
Subjective:    Patient ID: [name], female    DOB: 04-26-1971, 47 y.o.   MRN: [id number]  HPI  Pt presents to the clinic today for her annual exam. She is also due to follow up chronic conditions.  HTN: Her BP today is. She is taking Amlodipine, HCTZ and a potassium supplement as prescribed. ECG from 02/2017 reviewed.  HLD: Her last LDL was 79, 08/2016. She denies myalgias on Lipitor. She tries to consume a low fat diet.  Mood Disorder: Stable on Effexor. She denies SI/HI.  GERD/[facility]: She has been following Dr. Myrtie Neither, note from 04/2017 reviewed.  Flu: 04/2017 Tetanus: Pap Smear: Mammogram: 04/2012 Vision Screening: Dentist:  Diet: Exercise:  Review of Systems      Past Medical History:  Diagnosis Date  . Allergy   . Anxiety   . GERD (gastroesophageal reflux disease)   . Hyperlipidemia   . Hypertension   . PVC (premature ventricular contraction) 07/20/2016  . Sinus tachycardia 07/20/2016    Current Outpatient Medications  Medication Sig Dispense Refill  . amLODipine (NORVASC) 10 MG tablet TAKE 1 TABLET(10 MG) BY MOUTH DAILY 30 tablet 2  . atorvastatin (LIPITOR) 10 MG tablet TAKE 1 TABLET(10 MG) BY MOUTH DAILY 30 tablet 2  . cyclobenzaprine (FLEXERIL) 10 MG tablet Take 1 tablet (10 mg total) by mouth 3 (three) times daily. 20 tablet 0  . hydrochlorothiazide (HYDRODIURIL) 25 MG tablet TAKE 1 TABLET(25 MG) BY MOUTH DAILY 30 tablet 8  . potassium chloride SA (K-DUR,KLOR-CON) 20 MEQ tablet TAKE 1 TABLET(20 MEQ) BY MOUTH TWICE DAILY 60 tablet 0  . promethazine (PHENERGAN) 25 MG suppository Place 1 suppository (25 mg total) rectally every 6 (six) hours as needed for nausea or vomiting. 12 each 0  . promethazine (PHENERGAN) 25 MG tablet Take 0.5-1 tablets (12.5-25 mg total) by mouth every 8 (eight) hours as needed for nausea or vomiting. 20 tablet 0  . venlafaxine XR (EFFEXOR-XR) 75 MG 24 hr capsule TAKE 1 CAPSULE(75 MG) BY MOUTH DAILY WITH BREAKFAST 30 capsule 0    No current facility-administered medications for this visit.     Allergies  Allergen Reactions  . Lotrel [Amlodipine Besy-Benazepril Hcl] Cough    Family History  Problem Relation Age of Onset  . Hypertension Mother   . Heart murmur Father   . Ovarian cancer Paternal Grandmother   . Lupus Sister   . Colon cancer Neg Hx   . Esophageal cancer Neg Hx   . Pancreatic cancer Neg Hx   . Rectal cancer Neg Hx   . Stomach cancer Neg Hx     Social History   Socioeconomic History  . Marital status: Married    Spouse name: Not on file  . Number of children: Not on file  . Years of education: Not on file  . Highest education level: Not on file  Social Needs  . Financial resource strain: Not on file  . Food insecurity - worry: Not on file  . Food insecurity - inability: Not on file  . Transportation needs - medical: Not on file  . Transportation needs - non-medical: Not on file  Occupational History  . Not on file  Tobacco Use  . Smoking status: Light Tobacco Smoker    Packs/day: 0.30  . Smokeless tobacco: Never Used  . Tobacco comment: black-n-milds   Substance and Sexual Activity  . Alcohol use: Yes    Comment: occasional   . Drug use: Yes    Types: Marijuana  Comment: occasional   . Sexual activity: Yes    Birth control/protection: Surgical  Other Topics Concern  . Not on file  Social History Narrative  . Not on file     Constitutional: Denies fever, malaise, fatigue, headache or abrupt weight changes.  HEENT: Denies eye pain, eye redness, ear pain, ringing in the ears, wax buildup, runny nose, nasal congestion, bloody nose, or sore throat. Respiratory: Denies difficulty breathing, shortness of breath, cough or sputum production.   Cardiovascular: Denies chest pain, chest tightness, palpitations or swelling in the hands or feet.  Gastrointestinal: Denies abdominal pain, bloating, constipation, diarrhea or blood in the stool.  GU: Denies urgency, frequency, pain  with urination, burning sensation, blood in urine, odor or discharge. Musculoskeletal: Denies decrease in range of motion, difficulty with gait, muscle pain or joint pain and swelling.  Skin: Denies redness, rashes, lesions or ulcercations.  Neurological: Denies dizziness, difficulty with memory, difficulty with speech or problems with balance and coordination.  Psych: Denies anxiety, depression, SI/HI.  No other specific complaints in a complete review of systems (except as listed in HPI above).  Objective:   Physical Exam        Assessment & Plan:

## 2017-07-26 ENCOUNTER — Other Ambulatory Visit: Payer: Self-pay | Admitting: Internal Medicine

## 2017-07-26 DIAGNOSIS — I10 Essential (primary) hypertension: Secondary | ICD-10-CM

## 2017-07-26 DIAGNOSIS — E876 Hypokalemia: Secondary | ICD-10-CM

## 2017-07-26 DIAGNOSIS — T502X5A Adverse effect of carbonic-anhydrase inhibitors, benzothiadiazides and other diuretics, initial encounter: Secondary | ICD-10-CM

## 2017-07-30 ENCOUNTER — Other Ambulatory Visit: Payer: Self-pay | Admitting: Internal Medicine

## 2017-08-10 ENCOUNTER — Telehealth: Payer: Self-pay | Admitting: Internal Medicine

## 2017-08-10 NOTE — Telephone Encounter (Signed)
Have her schedule an appt for a blood pressure check

## 2017-08-10 NOTE — Telephone Encounter (Signed)
Copied from CRM [phone]. Topic: Quick Communication - See Telephone Encounter >> Aug 10, 2017 10:55 AM Jolayne Hainesaylor, Brittany L wrote: CRM for notification. See Telephone encounter for:   08/10/17.  Patient said she has been having elevated BP readings for the last 2 weeks, she said she is still on her meds, she wants to know does she need to come back in? Call back is [phone]

## 2017-08-11 NOTE — Telephone Encounter (Signed)
Pt has a f/u visit tomorrow

## 2017-08-12 ENCOUNTER — Ambulatory Visit (INDEPENDENT_AMBULATORY_CARE_PROVIDER_SITE_OTHER): Payer: Self-pay | Admitting: Internal Medicine

## 2017-08-12 ENCOUNTER — Encounter: Payer: Self-pay | Admitting: Internal Medicine

## 2017-08-12 DIAGNOSIS — I10 Essential (primary) hypertension: Secondary | ICD-10-CM

## 2017-08-12 MED ORDER — LOSARTAN POTASSIUM 25 MG PO TABS: 25.0000 mg | ORAL_TABLET | Freq: Every day | ORAL | 0 refills | Status: DC

## 2017-08-12 NOTE — Progress Notes (Signed)
Subjective:    Patient ID: [name], female    DOB: [birth year], 47 y.o.   MRN: [id number]  HPI  Pt presents to the clinic today with c/o elevated blood pressures. She has noticed this over the last 2 weeks. She reports her blood pressures at home (via fitbit) have been 150-160/90. She is taking Amlodipine and HCTZ as prescribed. She denies headaches, visual changes, dizziness or chest pain. ECG from 02/2017 reviewed.  Review of Systems  Past Medical History:  Diagnosis Date  . Allergy   . Anxiety   . GERD (gastroesophageal reflux disease)   . Hyperlipidemia   . Hypertension   . PVC (premature ventricular contraction) 07/20/2016  . Sinus tachycardia 07/20/2016    Current Outpatient Medications  Medication Sig Dispense Refill  . amLODipine (NORVASC) 10 MG tablet TAKE 1 TABLET(10 MG) BY MOUTH DAILY 30 tablet 0  . atorvastatin (LIPITOR) 10 MG tablet Take 1 tablet (10 mg total) by mouth daily at 6 PM. MUST SCHEDULE ANNUAL PHYSICAL 30 tablet 1  . cyclobenzaprine (FLEXERIL) 10 MG tablet Take 1 tablet (10 mg total) by mouth 3 (three) times daily. 20 tablet 0  . promethazine (PHENERGAN) 25 MG suppository Place 1 suppository (25 mg total) rectally every 6 (six) hours as needed for nausea or vomiting. 12 each 0  . promethazine (PHENERGAN) 25 MG tablet Take 0.5-1 tablets (12.5-25 mg total) by mouth every 8 (eight) hours as needed for nausea or vomiting. 20 tablet 0  . venlafaxine XR (EFFEXOR-XR) 75 MG 24 hr capsule TAKE 1 CAPSULE(75 MG) BY MOUTH DAILY WITH BREAKFAST 30 capsule 0  . losartan (COZAAR) 25 MG tablet Take 1 tablet (25 mg total) by mouth daily. 30 tablet 0   No current facility-administered medications for this visit.     Allergies  Allergen Reactions  . Lotrel [Amlodipine Besy-Benazepril Hcl] Cough    Family History  Problem Relation Age of Onset  . Hypertension Mother   . Heart murmur Father   . Ovarian cancer Paternal Grandmother   . Lupus Sister   . Colon  cancer Neg Hx   . Esophageal cancer Neg Hx   . Pancreatic cancer Neg Hx   . Rectal cancer Neg Hx   . Stomach cancer Neg Hx     Social History   Socioeconomic History  . Marital status: Married    Spouse name: Not on file  . Number of children: Not on file  . Years of education: Not on file  . Highest education level: Not on file  Social Needs  . Financial resource strain: Not on file  . Food insecurity - worry: Not on file  . Food insecurity - inability: Not on file  . Transportation needs - medical: Not on file  . Transportation needs - non-medical: Not on file  Occupational History  . Not on file  Tobacco Use  . Smoking status: Light Tobacco Smoker    Packs/day: 0.30  . Smokeless tobacco: Never Used  . Tobacco comment: black-n-milds   Substance and Sexual Activity  . Alcohol use: Yes    Comment: occasional   . Drug use: Yes    Types: Marijuana    Comment: occasional   . Sexual activity: Yes    Birth control/protection: Surgical  Other Topics Concern  . Not on file  Social History Narrative  . Not on file     Constitutional: Denies fever, malaise, fatigue, headache or abrupt weight changes.  Respiratory: Denies difficulty breathing, shortness of  breath, cough or sputum production.   Cardiovascular: Denies chest pain, chest tightness, palpitations or swelling in the hands or feet.  Neurological: Denies dizziness, difficulty with memory, difficulty with speech or problems with balance and coordination.    No other specific complaints in a complete review of systems (except as listed in HPI above).     Objective:   Physical Exam  BP 130/84   Pulse 83   Temp 97.9 F (36.6 C) (Oral)   Wt 139 lb (63 kg)   SpO2 98%   BMI 24.24 kg/m  Wt Readings from Last 3 Encounters:  08/12/17 139 lb (63 kg)  05/06/17 134 lb 12.8 oz (61.1 kg)  04/13/17 137 lb 12.8 oz (62.5 kg)    General: Appears her stated age, well developed, well nourished in NAD. Cardiovascular:  Normal rate and rhythm. S1,S2 noted.  No murmur, rubs or gallops noted. No JVD or BLE edema. Pulmonary/Chest: Normal effort and positive vesicular breath sounds. No respiratory distress. No wheezes, rales or ronchi noted.  Neurological: Alert and oriented.     BMET    Component Value Date/Time   NA 139 04/05/2017 1206   K 4.9 04/27/2017 0921   CL 98 04/05/2017 1206   CO2 29 04/05/2017 1206   GLUCOSE 90 04/05/2017 1206   BUN 10 04/05/2017 1206   CREATININE 0.62 04/05/2017 1206   CALCIUM 9.7 04/05/2017 1206   GFRNONAA >60 02/06/2017 0548   GFRAA >60 02/06/2017 0548    Lipid Panel     Component Value Date/Time   CHOL 154 08/28/2016 1016   TRIG 112.0 08/28/2016 1016   HDL 56.60 08/28/2016 1016   CHOLHDL 3 08/28/2016 1016   VLDL 22.4 08/28/2016 1016   LDLCALC 75 08/28/2016 1016    CBC    Component Value Date/Time   WBC 13.0 (H) 04/13/2017 1449   RBC 4.17 04/13/2017 1449   HGB 12.8 04/13/2017 1449   HGB 12.3 05/10/2007 1305   HCT 37.7 04/13/2017 1449   HCT 35.8 05/10/2007 1305   PLT 500.0 (H) 04/13/2017 1449   PLT 515 (H) 05/10/2007 1305   MCV 90.4 04/13/2017 1449   MCV 82.8 05/10/2007 1305   MCH 29.5 02/06/2017 0548   MCHC 33.9 04/13/2017 1449   RDW 14.8 04/13/2017 1449   RDW 15.1 (H) 05/10/2007 1305   LYMPHSABS 3.1 04/13/2017 1449   LYMPHSABS 2.4 05/10/2007 1305   MONOABS 0.6 04/13/2017 1449   MONOABS 0.4 05/10/2007 1305   EOSABS 0.1 04/13/2017 1449   EOSABS 0.1 05/10/2007 1305   BASOSABS 0.1 04/13/2017 1449   BASOSABS 0.0 05/10/2007 1305    Hgb A1C No results found for: HGBA1C          Assessment & Plan:

## 2017-08-12 NOTE — Patient Instructions (Signed)

## 2017-08-12 NOTE — Assessment & Plan Note (Signed)
She does not feel like HCTZ is effective, will d/c that and potassium supplement Continue Amlodipine Start Losartan 25 mg daily, eRx sent to pharmacy Reinforced DASH diet and increasing aerobic exercise  RTC in 2 weeks for BP followup

## 2017-08-26 ENCOUNTER — Ambulatory Visit: Payer: Self-pay | Admitting: Internal Medicine

## 2017-08-26 ENCOUNTER — Telehealth: Payer: Self-pay | Admitting: Internal Medicine

## 2017-08-26 NOTE — Telephone Encounter (Signed)
Do not charge NSF

## 2017-08-26 NOTE — Telephone Encounter (Signed)
See below crm  No show charge?  Copied from CRM [phone]#[id number]. Topic: Quick Communication - Appointment Cancellation >> Aug 26, 2017  8:25 AM Maia Pettiesrtiz, Kristie S wrote: Patient called to cancel appointment scheduled for today at 10:00am with Nicki Reaperegina BAity, NP. Patient has rescheduled their appointment.  pt has family emergency  Route to department's PEC pool.

## 2017-08-28 ENCOUNTER — Other Ambulatory Visit: Payer: Self-pay | Admitting: Internal Medicine

## 2017-08-30 ENCOUNTER — Encounter: Payer: Self-pay | Admitting: Internal Medicine

## 2017-08-30 ENCOUNTER — Ambulatory Visit: Payer: Self-pay | Admitting: Internal Medicine

## 2017-08-30 DIAGNOSIS — N951 Menopausal and female climacteric states: Secondary | ICD-10-CM

## 2017-08-30 DIAGNOSIS — I10 Essential (primary) hypertension: Secondary | ICD-10-CM

## 2017-08-30 MED ORDER — LOSARTAN POTASSIUM 25 MG PO TABS: 25.0000 mg | ORAL_TABLET | Freq: Every day | ORAL | 3 refills | Status: DC

## 2017-08-30 MED ORDER — VENLAFAXINE HCL ER 75 MG PO CP24: ORAL_CAPSULE | ORAL | 3 refills | Status: DC

## 2017-08-30 NOTE — Patient Instructions (Signed)

## 2017-08-30 NOTE — Assessment & Plan Note (Signed)
Controlled on Amlodipine and Losartan Losartan refilled today Will monitor  Make an appt for your annual exam

## 2017-08-30 NOTE — Progress Notes (Signed)
Subjective:    Patient ID: [name], female    DOB: [birth year], 47 y.o.   MRN: [id number]  HPI  Pt presents to the clinic today for follow up HTN. At her last visit, her HCTZ and Potassium were d/c'd. She was advised to continue Amlodipine. She was started on Losartan 25 mg daily. She has been taking the medications as prescribed. Her BP today is 120/70.   She would also like a refill of her Effexor today.  Review of Systems      Past Medical History:  Diagnosis Date  . Allergy   . Anxiety   . GERD (gastroesophageal reflux disease)   . Hyperlipidemia   . Hypertension   . PVC (premature ventricular contraction) 07/20/2016  . Sinus tachycardia 07/20/2016    Current Outpatient Medications  Medication Sig Dispense Refill  . amLODipine (NORVASC) 10 MG tablet TAKE 1 TABLET(10 MG) BY MOUTH DAILY 30 tablet 0  . atorvastatin (LIPITOR) 10 MG tablet Take 1 tablet (10 mg total) by mouth daily at 6 PM. MUST SCHEDULE ANNUAL PHYSICAL 30 tablet 1  . cyclobenzaprine (FLEXERIL) 10 MG tablet Take 1 tablet (10 mg total) by mouth 3 (three) times daily. 20 tablet 0  . losartan (COZAAR) 25 MG tablet Take 1 tablet (25 mg total) by mouth daily. 30 tablet 0  . promethazine (PHENERGAN) 25 MG suppository Place 1 suppository (25 mg total) rectally every 6 (six) hours as needed for nausea or vomiting. 12 each 0  . promethazine (PHENERGAN) 25 MG tablet Take 0.5-1 tablets (12.5-25 mg total) by mouth every 8 (eight) hours as needed for nausea or vomiting. 20 tablet 0  . venlafaxine XR (EFFEXOR-XR) 75 MG 24 hr capsule TAKE 1 CAPSULE(75 MG) BY MOUTH DAILY WITH BREAKFAST 30 capsule 0   No current facility-administered medications for this visit.     Allergies  Allergen Reactions  . Lotrel [Amlodipine Besy-Benazepril Hcl] Cough    Family History  Problem Relation Age of Onset  . Hypertension Mother   . Heart murmur Father   . Ovarian cancer Paternal Grandmother   . Lupus Sister   . Colon cancer  Neg Hx   . Esophageal cancer Neg Hx   . Pancreatic cancer Neg Hx   . Rectal cancer Neg Hx   . Stomach cancer Neg Hx     Social History   Socioeconomic History  . Marital status: Married    Spouse name: Not on file  . Number of children: Not on file  . Years of education: Not on file  . Highest education level: Not on file  Social Needs  . Financial resource strain: Not on file  . Food insecurity - worry: Not on file  . Food insecurity - inability: Not on file  . Transportation needs - medical: Not on file  . Transportation needs - non-medical: Not on file  Occupational History  . Not on file  Tobacco Use  . Smoking status: Light Tobacco Smoker    Packs/day: 0.30  . Smokeless tobacco: Never Used  . Tobacco comment: black-n-milds   Substance and Sexual Activity  . Alcohol use: Yes    Comment: occasional   . Drug use: Yes    Types: Marijuana    Comment: occasional   . Sexual activity: Yes    Birth control/protection: Surgical  Other Topics Concern  . Not on file  Social History Narrative  . Not on file     Constitutional: Denies fever, malaise, fatigue, headache or  abrupt weight changes.  Respiratory: Denies difficulty breathing, shortness of breath, cough or sputum production.   Cardiovascular: Denies chest pain, chest tightness, palpitations or swelling in the hands or feet.  Neurological: Denies dizziness, difficulty with memory, difficulty with speech or problems with balance and coordination.    No other specific complaints in a complete review of systems (except as listed in HPI above).  Objective:   Physical Exam   BP 120/70 (BP Location: Left Arm, Patient Position: Sitting, Cuff Size: Normal)   Pulse 89   Temp 97.9 F (36.6 C) (Oral)   Ht 5' 3.5" (1.613 m)   Wt 143 lb 12.8 oz (65.2 kg)   SpO2 98%   BMI 25.07 kg/m  Wt Readings from Last 3 Encounters:  08/30/17 143 lb 12.8 oz (65.2 kg)  08/12/17 139 lb (63 kg)  05/06/17 134 lb 12.8 oz (61.1 kg)     General: Appears her stated age, in NAD. Cardiovascular: Normal rate and rhythm. S1,S2 noted.  No murmur, rubs or gallops noted. No JVD or BLE edema.  Pulmonary/Chest: Normal effort and positive vesicular breath sounds. No respiratory distress. No wheezes, rales or ronchi noted.  Neurological: Alert and oriented.  :  BMET    Component Value Date/Time   NA 139 04/05/2017 1206   K 4.9 04/27/2017 0921   CL 98 04/05/2017 1206   CO2 29 04/05/2017 1206   GLUCOSE 90 04/05/2017 1206   BUN 10 04/05/2017 1206   CREATININE 0.62 04/05/2017 1206   CALCIUM 9.7 04/05/2017 1206   GFRNONAA >60 02/06/2017 0548   GFRAA >60 02/06/2017 0548    Lipid Panel     Component Value Date/Time   CHOL 154 08/28/2016 1016   TRIG 112.0 08/28/2016 1016   HDL 56.60 08/28/2016 1016   CHOLHDL 3 08/28/2016 1016   VLDL 22.4 08/28/2016 1016   LDLCALC 75 08/28/2016 1016    CBC    Component Value Date/Time   WBC 13.0 (H) 04/13/2017 1449   RBC 4.17 04/13/2017 1449   HGB 12.8 04/13/2017 1449   HGB 12.3 05/10/2007 1305   HCT 37.7 04/13/2017 1449   HCT 35.8 05/10/2007 1305   PLT 500.0 (H) 04/13/2017 1449   PLT 515 (H) 05/10/2007 1305   MCV 90.4 04/13/2017 1449   MCV 82.8 05/10/2007 1305   MCH 29.5 02/06/2017 0548   MCHC 33.9 04/13/2017 1449   RDW 14.8 04/13/2017 1449   RDW 15.1 (H) 05/10/2007 1305   LYMPHSABS 3.1 04/13/2017 1449   LYMPHSABS 2.4 05/10/2007 1305   MONOABS 0.6 04/13/2017 1449   MONOABS 0.4 05/10/2007 1305   EOSABS 0.1 04/13/2017 1449   EOSABS 0.1 05/10/2007 1305   BASOSABS 0.1 04/13/2017 1449   BASOSABS 0.0 05/10/2007 1305    Hgb A1C No results found for: HGBA1C         Assessment & Plan:

## 2017-08-30 NOTE — Assessment & Plan Note (Signed)
Controlled on Effexor Refilled today

## 2017-09-02 ENCOUNTER — Other Ambulatory Visit: Payer: Self-pay | Admitting: Internal Medicine

## 2017-09-29 ENCOUNTER — Other Ambulatory Visit: Payer: Self-pay | Admitting: Internal Medicine

## 2017-10-27 ENCOUNTER — Other Ambulatory Visit: Payer: Self-pay | Admitting: Internal Medicine

## 2017-11-03 ENCOUNTER — Telehealth: Payer: Self-pay

## 2017-11-03 MED ORDER — ATORVASTATIN CALCIUM 10 MG PO TABS: ORAL_TABLET | ORAL | 0 refills | Status: DC

## 2017-11-03 MED ORDER — AMLODIPINE BESYLATE 10 MG PO TABS: ORAL_TABLET | ORAL | 0 refills | Status: DC

## 2017-11-03 NOTE — Telephone Encounter (Signed)
Spoke to pt and she reports she has not called the pharmacy to double check on the status, in our records it shows receipt confirmed from pharmacy... Rx resent as pt states she took her last one today

## 2017-12-11 IMAGING — CT CT ENTEROGRAPHY (ABD-PELV W/ CM)
2 of 6 series · 16 of 46 positions shown, 18 images · IV contrast (ISOVUE 300)
Comparison: 02/05/2017 CT abdomen/pelvis.

CLINICAL DATA: Epigastric abdominal pain, episodic vomiting and
diarrhea.

EXAM:
CT ABDOMEN AND PELVIS WITH CONTRAST (ENTEROGRAPHY)
TECHNIQUE: Multidetector CT of the abdomen and pelvis during bolus
administration of intravenous contrast. Negative oral contrast was
given.
CONTRAST:  100mL [id number] IOPAMIDOL ([id number]) INJECTION 61%

[Series 4: thins · axial · 0.71mm/px · z∈[-609,-215]mm · 13 of 434 slices shown, 15 images]
[im 20/434  soft-tissue]
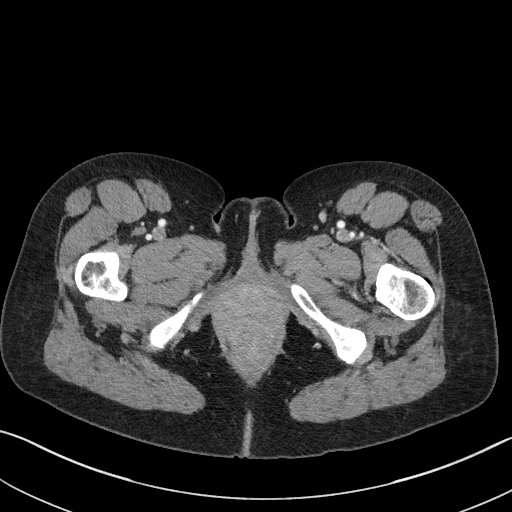
[im 20/434  bone]
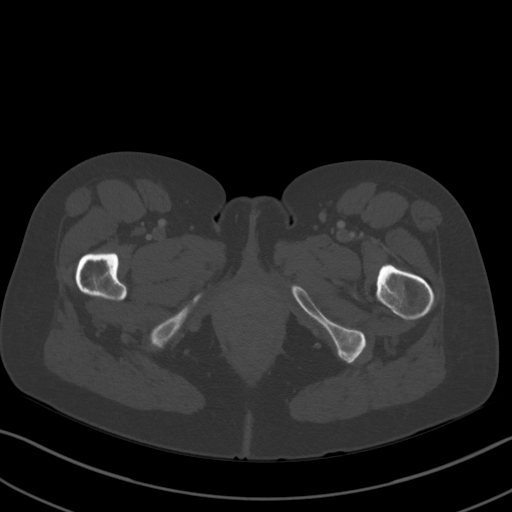
[im 60/434  soft-tissue]
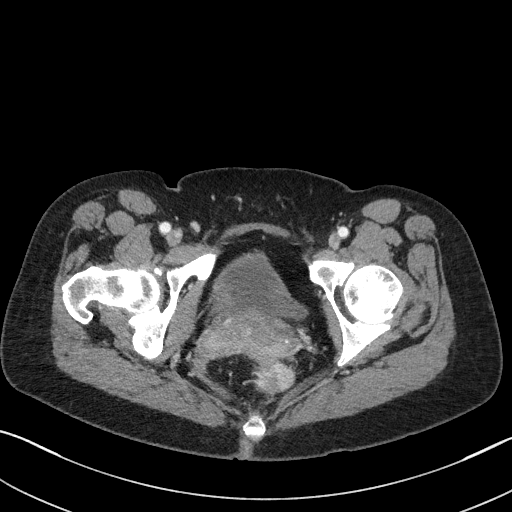
[im 99/434  soft-tissue]
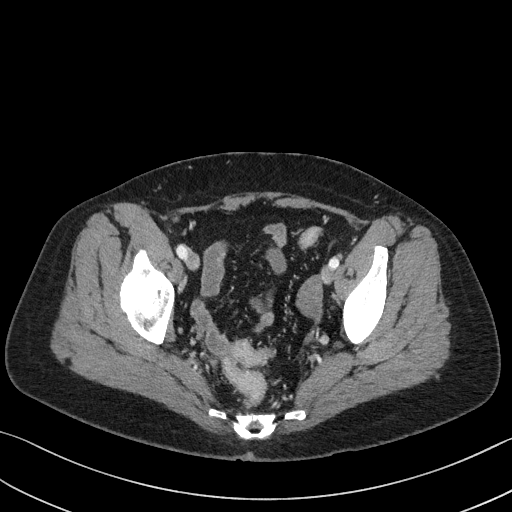
[im 119/434  soft-tissue]
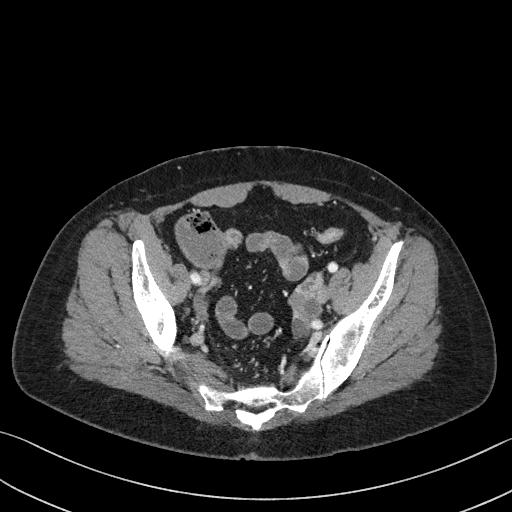
[im 158/434  soft-tissue]
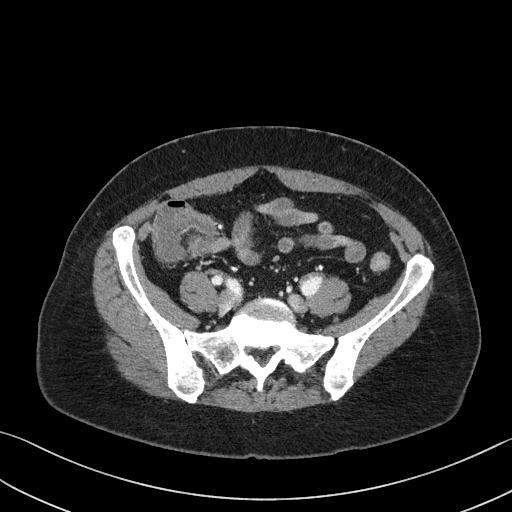
[im 178/434  soft-tissue]
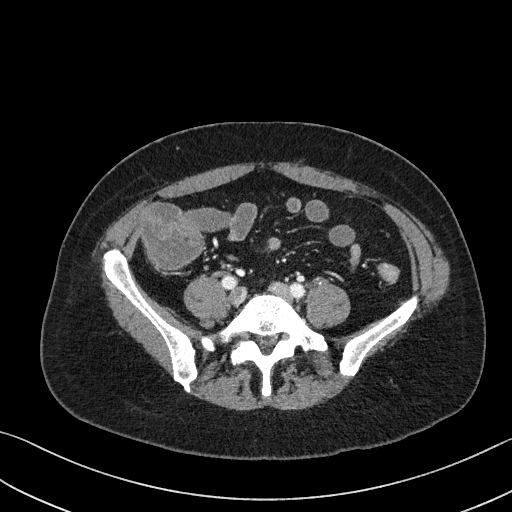
[im 217/434  soft-tissue]
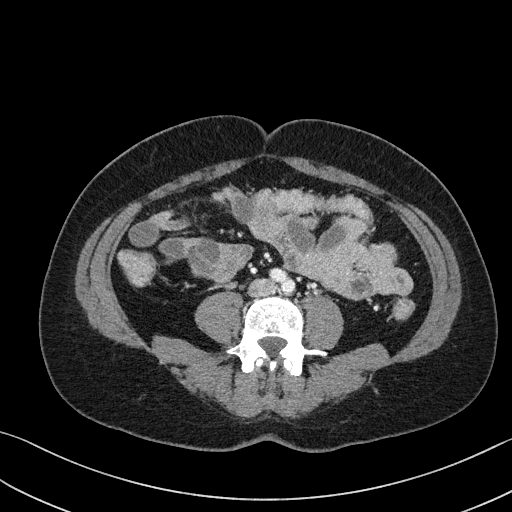
[im 256/434  soft-tissue]
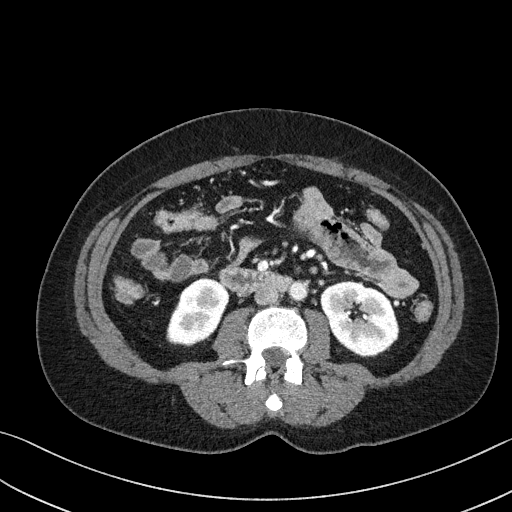
[im 276/434  soft-tissue]
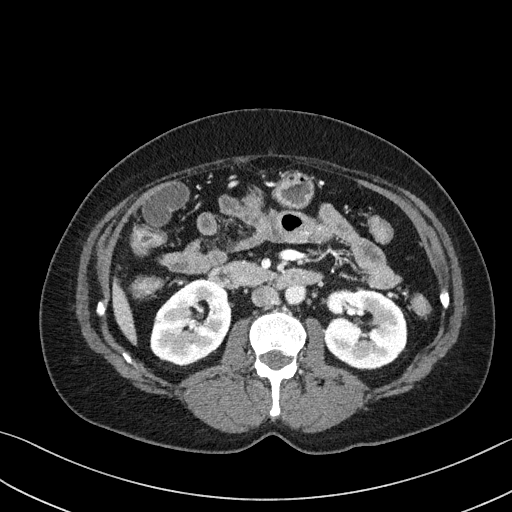
[im 276/434  bone]
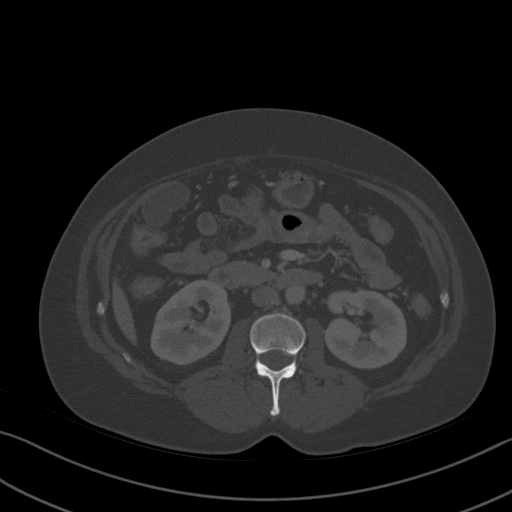
[im 315/434  soft-tissue]
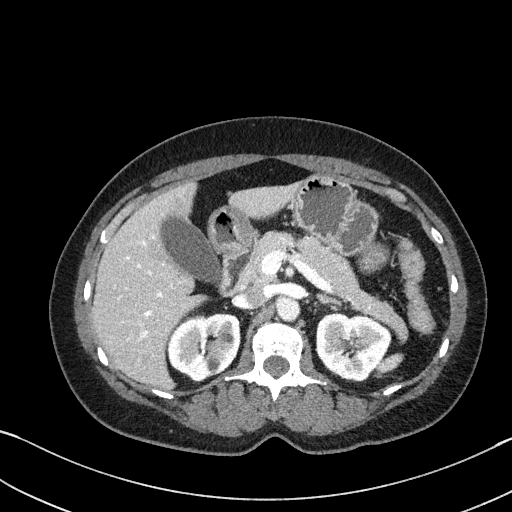
[im 335/434  soft-tissue]
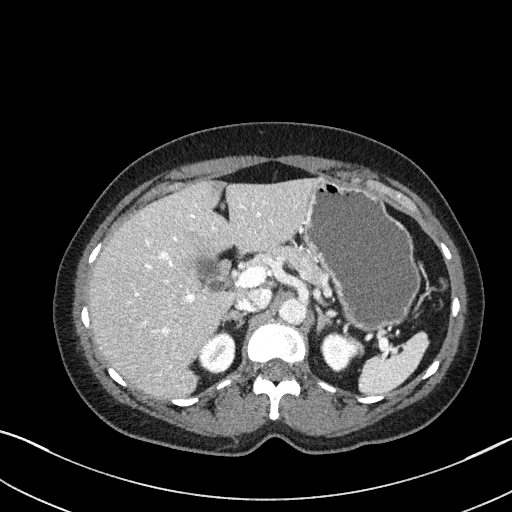
[im 374/434  soft-tissue]
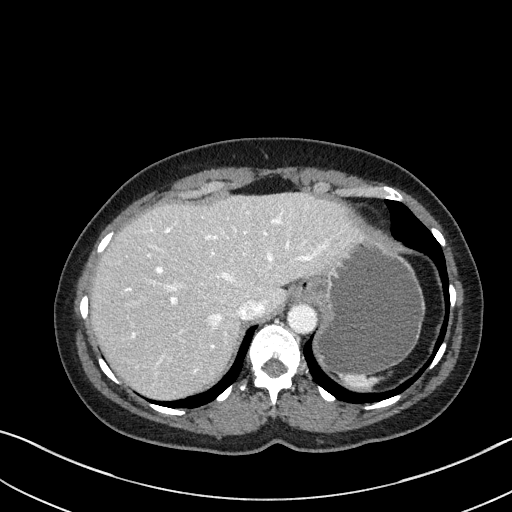
[im 414/434  soft-tissue]
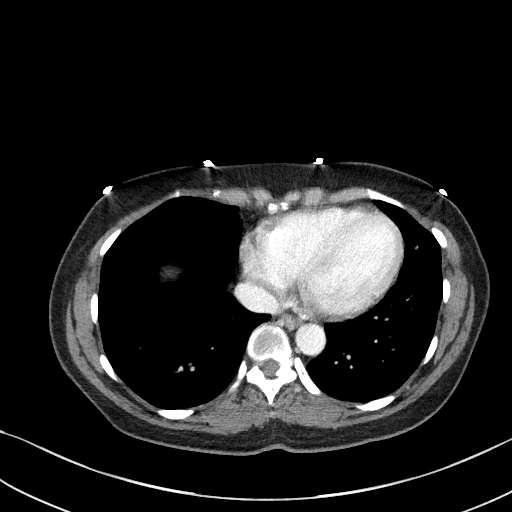

[Series 6: coronal · coronal · 0.81mm/px · 3 of 68 slices shown]
[im 23/68  soft-tissue]
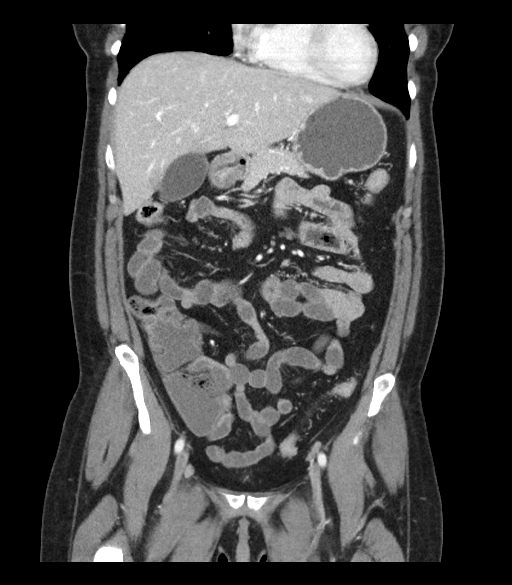
[im 30/68  soft-tissue]
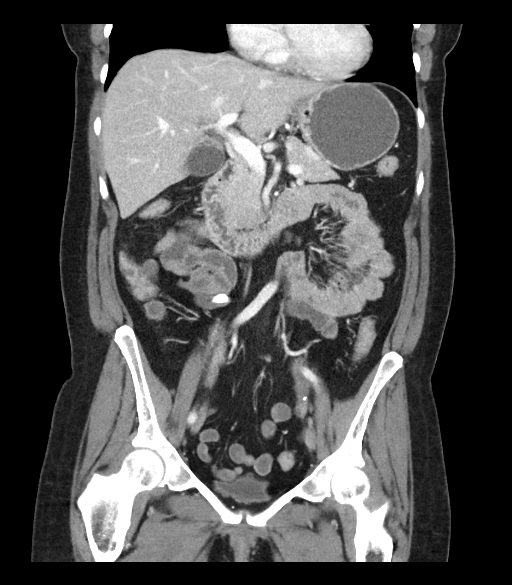
[im 38/68  soft-tissue]
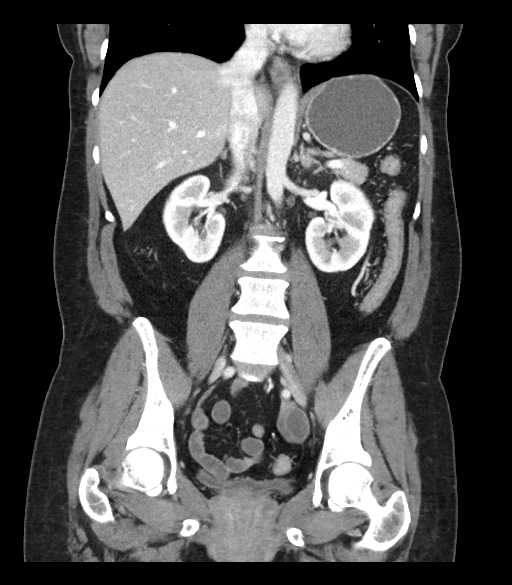

[16 of 46 positions shown; findings below may reference images not displayed]

FINDINGS: Lower chest: No significant pulmonary nodules or acute consolidative
airspace disease.

Hepatobiliary: Normal liver with no liver mass. Normal gallbladder
with no radiopaque cholelithiasis. No biliary ductal dilatation.
Common bile duct diameter 5 mm.

Pancreas: Normal, with no mass or duct dilation.

Spleen: Normal size. No mass.

Adrenals/Urinary Tract: Normal adrenals. Simple 1.5 cm medial upper
right renal cyst. Simple 1.7 cm interpolar right renal cyst.
Additional subcentimeter hypodense renal cortical lesions scattered
in the right greater than left kidneys, too small to characterize,
which require no follow-up. No hydronephrosis. Collapsed normal
bladder.

Stomach/Bowel: Normal nondistended stomach. Normal caliber small
bowel. No small bowel wall thickening or mucosal hyperenhancement.
Two undigested pills are noted within mid abdominal small bowel
loops. No discrete small bowel mass. Unremarkable terminal ileum.
Normal appendix. Normal large bowel with no diverticulosis, large
bowel wall thickening, large-bowel mucosal hyperenhancement or
significant pericolonic fat stranding.

Vascular/Lymphatic: Normal caliber abdominal aorta. Patent portal,
splenic, hepatic and renal veins. No pathologically enlarged lymph
nodes in the abdomen or pelvis.

Reproductive: Status post hysterectomy, with no abnormal findings at
the vaginal cuff. No right adnexal mass. A few simple left adnexal
cysts, largest 1.9 cm, which require no follow-up ( This
recommendation follows ACR consensus guidelines: White Paper of the
ACR Incidental Findings Committee II on Adnexal Findings. [HOSPITAL] [DATE]).

Other: No pneumoperitoneum, ascites or focal fluid collection.

Musculoskeletal: No aggressive appearing focal osseous lesions.
IMPRESSION: No acute abnormality. No evidence of inflammatory bowel disease or
bowel obstruction.

## 2018-02-02 ENCOUNTER — Other Ambulatory Visit: Payer: Self-pay | Admitting: Internal Medicine

## 2018-04-12 ENCOUNTER — Other Ambulatory Visit: Payer: Self-pay

## 2018-04-12 MED ORDER — ATORVASTATIN CALCIUM 10 MG PO TABS: ORAL_TABLET | ORAL | 0 refills | Status: DC

## 2018-05-04 ENCOUNTER — Other Ambulatory Visit: Payer: Self-pay | Admitting: Internal Medicine

## 2018-05-04 DIAGNOSIS — E876 Hypokalemia: Secondary | ICD-10-CM

## 2018-05-04 DIAGNOSIS — I10 Essential (primary) hypertension: Secondary | ICD-10-CM

## 2018-05-04 DIAGNOSIS — T502X5A Adverse effect of carbonic-anhydrase inhibitors, benzothiadiazides and other diuretics, initial encounter: Principal | ICD-10-CM

## 2018-05-06 ENCOUNTER — Other Ambulatory Visit: Payer: Self-pay | Admitting: Internal Medicine

## 2018-05-26 ENCOUNTER — Encounter (HOSPITAL_COMMUNITY): Payer: Self-pay

## 2018-05-26 ENCOUNTER — Other Ambulatory Visit: Payer: Self-pay

## 2018-05-26 ENCOUNTER — Ambulatory Visit (HOSPITAL_COMMUNITY)
Admission: EM | Admit: 2018-05-26 | Discharge: 2018-05-26 | Disposition: A | Discharge status: Home / Self Care | Payer: Self-pay | Attending: Family Medicine | Admitting: Family Medicine

## 2018-05-26 DIAGNOSIS — B09 Unspecified viral infection characterized by skin and mucous membrane lesions: Secondary | ICD-10-CM

## 2018-05-26 MED ORDER — TETRACAINE HCL 0.5 % OP SOLN
OPHTHALMIC | Status: AC
  Filled 2018-05-26: qty 4

## 2018-05-26 MED ORDER — ACYCLOVIR 800 MG PO TABS: 800.0000 mg | ORAL_TABLET | Freq: Every day | ORAL | 0 refills | Status: AC

## 2018-05-26 NOTE — Discharge Instructions (Signed)
Please begin acyclovir every 5 hours for the next 7 days  Monitor redness and swelling, vision- follow up if worsening or going closer to eye

## 2018-05-26 NOTE — ED Triage Notes (Signed)
Pt cc right eye pain and swelling x 2 days

## 2018-05-27 NOTE — ED Provider Notes (Signed)
[facility]    CSN: [id number] Arrival date & time: 05/26/18  1327     History   Chief Complaint Chief Complaint  Patient presents with  . Eye Problem    HPI [name] is a 47 y.o. female history of hypertension, hyperlipidemia, GERD presenting today for evaluation of right eye pain and swelling.  Patient states that over the past 2 days she has developed small bumps below her right eye.  These have been associated with itching, burning and discomfort.  She has also had swelling and redness around this area.  She denies any changes in vision, but has had increased watery drainage and occasional blurry vision.  She feels as if there is almost a film over her eye.  Denies pain or irritation inside the eye.  Patient denies wearing contacts, but does wear glasses.  Has not been able to wear her glasses as the room has been irritating the area where she has developed the rash.  HPI  Past Medical History:  Diagnosis Date  . Allergy   . Anxiety   . GERD (gastroesophageal reflux disease)   . Hyperlipidemia   . Hypertension   . PVC (premature ventricular contraction) 07/20/2016  . Sinus tachycardia 07/20/2016    Patient Active Problem List   Diagnosis Date Noted  . Menopausal symptoms 08/30/2017  . HYPERTENSION, BENIGN ESSENTIAL 01/26/2007    Past Surgical History:  Procedure Laterality Date  . ABDOMINAL HYSTERECTOMY    . TUBAL LIGATION    . WISDOM TOOTH EXTRACTION      OB History   None      Home Medications    Prior to Admission medications   Medication Sig Start Date End Date Taking? Authorizing Provider  acyclovir (ZOVIRAX) 800 MG tablet Take 1 tablet (800 mg total) by mouth 5 (five) times daily for 7 days. 05/26/18 06/02/18  Sian Joles C, PA-C  amLODipine (NORVASC) 10 MG tablet TAKE 1 TABLET BY MOUTH DAILY. *MUST SCHEDULE ANNUAL PHYSICAL* 02/02/18   Lorre MunroeBaity, Regina W, NP  atorvastatin (LIPITOR) 10 MG tablet TAKE 1 TABLET BY MOUTH DAILY AT 6 PM  04/12/18   Lorre MunroeBaity, Regina W, NP  atorvastatin (LIPITOR) 10 MG tablet TAKE 1 TABLET BY MOUTH DAILY AT 6 PM 05/06/18   Baity, Salvadore Oxfordegina W, NP  cyclobenzaprine (FLEXERIL) 10 MG tablet Take 1 tablet (10 mg total) by mouth 3 (three) times daily. 03/09/17   Mardella LaymanHagler, Brian, MD  losartan (COZAAR) 25 MG tablet Take 1 tablet (25 mg total) by mouth daily. 08/30/17   Lorre MunroeBaity, Regina W, NP  promethazine (PHENERGAN) 25 MG suppository Place 1 suppository (25 mg total) rectally every 6 (six) hours as needed for nausea or vomiting. 03/23/17   Lorre MunroeBaity, Regina W, NP  promethazine (PHENERGAN) 25 MG tablet Take 0.5-1 tablets (12.5-25 mg total) by mouth every 8 (eight) hours as needed for nausea or vomiting. 02/05/17   Eustaquio BoydenGutierrez, Javier, MD  venlafaxine XR (EFFEXOR-XR) 75 MG 24 hr capsule TAKE 1 CAPSULE(75 MG) BY MOUTH DAILY WITH BREAKFAST 08/30/17   Lorre MunroeBaity, Regina W, NP    Family History Family History  Problem Relation Age of Onset  . Hypertension Mother   . Heart murmur Father   . Ovarian cancer Paternal Grandmother   . Lupus Sister   . Colon cancer Neg Hx   . Esophageal cancer Neg Hx   . Pancreatic cancer Neg Hx   . Rectal cancer Neg Hx   . Stomach cancer Neg Hx  Social History Social History   Tobacco Use  . Smoking status: Light Tobacco Smoker    Packs/day: 0.30  . Smokeless tobacco: Never Used  . Tobacco comment: black-n-milds   Substance Use Topics  . Alcohol use: Yes    Comment: occasional   . Drug use: Yes    Types: Marijuana    Comment: occasional      Allergies   Lotrel [amlodipine besy-benazepril hcl]   Review of Systems Review of Systems  Constitutional: Negative for fatigue and fever.  HENT: Negative for mouth sores.   Eyes: Positive for discharge and redness. Negative for photophobia, pain, itching and visual disturbance.  Respiratory: Negative for shortness of breath.   Cardiovascular: Negative for chest pain.  Gastrointestinal: Negative for abdominal pain, nausea and vomiting.    Genitourinary: Negative for genital sores.  Musculoskeletal: Negative for arthralgias and joint swelling.  Skin: Positive for color change and rash. Negative for wound.  Neurological: Negative for dizziness, weakness, light-headedness and headaches.     Physical Exam Triage Vital Signs ED Triage Vitals  Enc Vitals Group     BP 05/26/18 1403 131/84     Pulse Rate 05/26/18 1403 95     Resp 05/26/18 1403 16     Temp 05/26/18 1403 98.5 F (36.9 C)     Temp Source 05/26/18 1403 Oral     SpO2 05/26/18 1403 98 %     Weight 05/26/18 1401 135 lb (61.2 kg)     Height --      Head Circumference --      Peak Flow --      Pain Score 05/26/18 1401 7     Pain Loc --      Pain Edu? --      Excl. in GC? --    No data found.  Updated Vital Signs BP 131/84 (BP Location: Right Arm)   Pulse 95   Temp 98.5 F (36.9 C) (Oral)   Resp 16   Wt 135 lb (61.2 kg)   SpO2 98%   BMI 23.54 kg/m   Visual Acuity Right Eye Distance: 20/30 Left Eye Distance: 20/20 Bilateral Distance: 20/20  Right Eye Near:   Left Eye Near:    Bilateral Near:     Physical Exam  Constitutional: She is oriented to person, place, and time. She appears well-developed and well-nourished.  No acute distress  HENT:  Head: Normocephalic and atraumatic.  Nose: Nose normal.  Eyes: Pupils are equal, round, and reactive to light. Conjunctivae and EOM are normal.  Infraorbital area of right eye with clustered vesicular grayish lesions on erythematous base with swelling, conjunctive a nonerythematous, limbus clear, no photophobia with exam, no fluorescein uptake or sign of corneal abrasion, ulcer, dendrites.  Neck: Neck supple.  Cardiovascular: Normal rate.  Pulmonary/Chest: Effort normal. No respiratory distress.  Abdominal: She exhibits no distension.  Musculoskeletal: Normal range of motion.  Neurological: She is alert and oriented to person, place, and time.  Skin: Skin is warm and dry.  Psychiatric: She has a  normal mood and affect.  Nursing note and vitals reviewed.         UC Treatments / Results  Labs (all labs ordered are listed, but only abnormal results are displayed) Labs Reviewed - No data to display  EKG None  Radiology No results found.  Procedures Procedures (including critical care time)  Medications Ordered in UC Medications - No data to display  Initial Impression / Assessment and Plan / UC Course  I have reviewed the triage vital signs and the nursing notes.  Pertinent labs & imaging results that were available during my care of the patient were reviewed by me and considered in my medical decision making (see chart for details).     Patient with rash concerning for viral cause.  Discussed possibility of shingles all though not typical area of distribution-above eye.  Given location URI will still initiate treatment with acyclovir.  Recommended patient to continue to monitor symptoms and follow-up if symptoms encouraging more, developing increased swelling, pain inside the eye or changes in vision.Discussed strict return precautions. Patient verbalized understanding and is agreeable with plan.  Final Clinical Impressions(s) / UC Diagnoses   Final diagnoses:  Viral rash     Discharge Instructions     Please begin acyclovir every 5 hours for the next 7 days  Monitor redness and swelling, vision- follow up if worsening or going closer to eye   ED Prescriptions    Medication Sig Dispense Auth. Provider   acyclovir (ZOVIRAX) 800 MG tablet Take 1 tablet (800 mg total) by mouth 5 (five) times daily for 7 days. 35 tablet Dorsel Flinn, [place] C, PA-C     Controlled Substance Prescriptions [place] Controlled Substance Registry consulted? Not Applicable   Lew Dawes, [place] 05/27/18 [phone]

## 2018-06-07 ENCOUNTER — Ambulatory Visit: Payer: Self-pay | Admitting: Internal Medicine

## 2018-06-07 DIAGNOSIS — Z0289 Encounter for other administrative examinations: Secondary | ICD-10-CM

## 2018-06-07 NOTE — Progress Notes (Deleted)
Subjective:    Patient ID: [name], female    DOB: 1971-05-08, 47 y.o.   MRN: [id number]  HPI  Pt presents to the clinic today with c/o a rash on her legs.  Review of Systems      Past Medical History:  Diagnosis Date  . Allergy   . Anxiety   . GERD (gastroesophageal reflux disease)   . Hyperlipidemia   . Hypertension   . PVC (premature ventricular contraction) 07/20/2016  . Sinus tachycardia 07/20/2016    Current Outpatient Medications  Medication Sig Dispense Refill  . amLODipine (NORVASC) 10 MG tablet TAKE 1 TABLET BY MOUTH DAILY. *MUST SCHEDULE ANNUAL PHYSICAL* 90 tablet 0  . atorvastatin (LIPITOR) 10 MG tablet TAKE 1 TABLET BY MOUTH DAILY AT 6 PM 90 tablet 0  . atorvastatin (LIPITOR) 10 MG tablet TAKE 1 TABLET BY MOUTH DAILY AT 6 PM 90 tablet 0  . cyclobenzaprine (FLEXERIL) 10 MG tablet Take 1 tablet (10 mg total) by mouth 3 (three) times daily. 20 tablet 0  . losartan (COZAAR) 25 MG tablet Take 1 tablet (25 mg total) by mouth daily. 90 tablet 3  . promethazine (PHENERGAN) 25 MG suppository Place 1 suppository (25 mg total) rectally every 6 (six) hours as needed for nausea or vomiting. 12 each 0  . promethazine (PHENERGAN) 25 MG tablet Take 0.5-1 tablets (12.5-25 mg total) by mouth every 8 (eight) hours as needed for nausea or vomiting. 20 tablet 0  . venlafaxine XR (EFFEXOR-XR) 75 MG 24 hr capsule TAKE 1 CAPSULE(75 MG) BY MOUTH DAILY WITH BREAKFAST 90 capsule 3   No current facility-administered medications for this visit.     Allergies  Allergen Reactions  . Lotrel [Amlodipine Besy-Benazepril Hcl] Cough    Family History  Problem Relation Age of Onset  . Hypertension Mother   . Heart murmur Father   . Ovarian cancer Paternal Grandmother   . Lupus Sister   . Colon cancer Neg Hx   . Esophageal cancer Neg Hx   . Pancreatic cancer Neg Hx   . Rectal cancer Neg Hx   . Stomach cancer Neg Hx     Social History   Socioeconomic History  . Marital  status: Married    Spouse name: Not on file  . Number of children: Not on file  . Years of education: Not on file  . Highest education level: Not on file  Occupational History  . Not on file  Social Needs  . Financial resource strain: Not on file  . Food insecurity:    Worry: Not on file    Inability: Not on file  . Transportation needs:    Medical: Not on file    Non-medical: Not on file  Tobacco Use  . Smoking status: Light Tobacco Smoker    Packs/day: 0.30  . Smokeless tobacco: Never Used  . Tobacco comment: black-n-milds   Substance and Sexual Activity  . Alcohol use: Yes    Comment: occasional   . Drug use: Yes    Types: Marijuana    Comment: occasional   . Sexual activity: Yes    Birth control/protection: Surgical  Lifestyle  . Physical activity:    Days per week: Not on file    Minutes per session: Not on file  . Stress: Not on file  Relationships  . Social connections:    Talks on phone: Not on file    Gets together: Not on file    Attends religious service: Not  on file    Active member of club or organization: Not on file    Attends meetings of clubs or organizations: Not on file    Relationship status: Not on file  . Intimate partner violence:    Fear of current or ex partner: Not on file    Emotionally abused: Not on file    Physically abused: Not on file    Forced sexual activity: Not on file  Other Topics Concern  . Not on file  Social History Narrative  . Not on file     Constitutional: Denies fever, malaise, fatigue, headache or abrupt weight changes.  HEENT: Denies eye pain, eye redness, ear pain, ringing in the ears, wax buildup, runny nose, nasal congestion, bloody nose, or sore throat. Respiratory: Denies difficulty breathing, shortness of breath, cough or sputum production.   Cardiovascular: Denies chest pain, chest tightness, palpitations or swelling in the hands or feet.  Gastrointestinal: Denies abdominal pain, bloating, constipation,  diarrhea or blood in the stool.  GU: Denies urgency, frequency, pain with urination, burning sensation, blood in urine, odor or discharge. Musculoskeletal: Denies decrease in range of motion, difficulty with gait, muscle pain or joint pain and swelling.  Skin: Pt reports rash. Denies ulcercations.  Neurological: Denies dizziness, difficulty with memory, difficulty with speech or problems with balance and coordination.  Psych: Denies anxiety, depression, SI/HI.  No other specific complaints in a complete review of systems (except as listed in HPI above).  Objective:   Physical Exam        Assessment & Plan:

## 2018-07-18 ENCOUNTER — Encounter: Payer: Self-pay | Admitting: Internal Medicine

## 2018-08-12 ENCOUNTER — Other Ambulatory Visit: Payer: Self-pay | Admitting: Internal Medicine

## 2018-08-15 ENCOUNTER — Encounter: Payer: Self-pay | Admitting: Internal Medicine

## 2018-08-15 ENCOUNTER — Ambulatory Visit (INDEPENDENT_AMBULATORY_CARE_PROVIDER_SITE_OTHER): Payer: BC Managed Care – PPO | Admitting: Internal Medicine

## 2018-08-15 VITALS — BP 124/74 | HR 92 | Temp 98.2°F | Ht 63.5 in | Wt 145.0 lb

## 2018-08-15 DIAGNOSIS — R05 Cough: Secondary | ICD-10-CM | POA: Diagnosis not present

## 2018-08-15 DIAGNOSIS — B9789 Other viral agents as the cause of diseases classified elsewhere: Secondary | ICD-10-CM

## 2018-08-15 DIAGNOSIS — J069 Acute upper respiratory infection, unspecified: Secondary | ICD-10-CM | POA: Diagnosis not present

## 2018-08-15 DIAGNOSIS — R059 Cough, unspecified: Secondary | ICD-10-CM

## 2018-08-15 MED ORDER — HYDROCOD POLST-CPM POLST ER 10-8 MG/5ML PO SUER: 5.0000 mL | Freq: Three times a day (TID) | ORAL | 0 refills | Status: DC | PRN

## 2018-08-15 MED ORDER — METHYLPREDNISOLONE ACETATE 80 MG/ML IJ SUSP
80.0000 mg | Freq: Once | INTRAMUSCULAR | Status: AC
  Administered 2018-08-15: 80 mg via INTRAMUSCULAR

## 2018-08-15 NOTE — Progress Notes (Signed)
HPI  Pt presents to the clinic today with c/o sore throat and cough. She reports this started 1 week ago. She denies difficulty swallowing. The cough is non productive. The cough seems worse at night. She denies runny nose, nasal congestion, ear pain or shortness of breath. She denies fever, chills or body aches. She has tried Ricola cough drops with some relief. She has not had sick contacts.  Review of Systems      Past Medical History:  Diagnosis Date  . Allergy   . Anxiety   . GERD (gastroesophageal reflux disease)   . Hyperlipidemia   . Hypertension   . PVC (premature ventricular contraction) 07/20/2016  . Sinus tachycardia 07/20/2016    Family History  Problem Relation Age of Onset  . Hypertension Mother   . Heart murmur Father   . Ovarian cancer Paternal Grandmother   . Lupus Sister   . Colon cancer Neg Hx   . Esophageal cancer Neg Hx   . Pancreatic cancer Neg Hx   . Rectal cancer Neg Hx   . Stomach cancer Neg Hx     Social History   Socioeconomic History  . Marital status: Married    Spouse name: Not on file  . Number of children: Not on file  . Years of education: Not on file  . Highest education level: Not on file  Occupational History  . Not on file  Social Needs  . Financial resource strain: Not on file  . Food insecurity:    Worry: Not on file    Inability: Not on file  . Transportation needs:    Medical: Not on file    Non-medical: Not on file  Tobacco Use  . Smoking status: Light Tobacco Smoker    Packs/day: 0.30  . Smokeless tobacco: Never Used  . Tobacco comment: black-n-milds   Substance and Sexual Activity  . Alcohol use: Yes    Comment: occasional   . Drug use: Yes    Types: Marijuana    Comment: occasional   . Sexual activity: Yes    Birth control/protection: Surgical  Lifestyle  . Physical activity:    Days per week: Not on file    Minutes per session: Not on file  . Stress: Not on file  Relationships  . Social connections:     Talks on phone: Not on file    Gets together: Not on file    Attends religious service: Not on file    Active member of club or organization: Not on file    Attends meetings of clubs or organizations: Not on file    Relationship status: Not on file  . Intimate partner violence:    Fear of current or ex partner: Not on file    Emotionally abused: Not on file    Physically abused: Not on file    Forced sexual activity: Not on file  Other Topics Concern  . Not on file  Social History Narrative  . Not on file    Allergies  Allergen Reactions  . Lotrel [Amlodipine Besy-Benazepril Hcl] Cough     Constitutional: Positive headache, fatigue and fever. Denies abrupt weight changes.  HEENT:  Positive sore throat. Denies eye redness, eye pain, pressure behind the eyes, facial pain, nasal congestion, ear pain, ringing in the ears, wax buildup, runny nose or bloody nose. Respiratory: Positive cough. Denies difficulty breathing or shortness of breath.  Cardiovascular: Denies chest pain, chest tightness, palpitations or swelling in the hands or feet.  No other specific complaints in a complete review of systems (except as listed in HPI above).  Objective:   BP 124/74   Pulse 92   Temp 98.2 F (36.8 C) (Oral)   Ht 5' 3.5" (1.613 m)   Wt 145 lb (65.8 kg)   SpO2 98%   BMI 25.28 kg/m   Wt Readings from Last 3 Encounters:  08/15/18 145 lb (65.8 kg)  05/26/18 135 lb (61.2 kg)  08/30/17 143 lb 12.8 oz (65.2 kg)     General: Appears her stated age, well developed, well nourished in NAD. HEENT: Head: normal shape and size, no sinus tenderness noted; Ears: Tm's gray and intact, normal light reflex; Nose: mucosa boggy and moist, septum midline; Throat/Mouth: + PND. Teeth present, mucosa erythematous and moist, no exudate noted, no lesions or ulcerations noted.  Neck: No cervical lymphadenopathy.  Cardiovascular: Normal rate and rhythm. S1,S2 noted.  No murmur, rubs or gallops noted.   Pulmonary/Chest: Normal effort and positive vesicular breath sounds. No respiratory distress. No wheezes, rales or ronchi noted.       Assessment & Plan:  Viral URI Cough:  Get some rest and drink plenty of water 80 mg Depo IM today Start Zyrtec and Flonase eRx for Tussionex cough syrup  RTC as needed or if symptoms persist.   Melissa Reaperegina Meela Wareing, NP

## 2018-08-15 NOTE — Addendum Note (Signed)
Addended by: Roena Malady on: 08/15/2018 04:42 PM   Modules accepted: Orders

## 2018-08-15 NOTE — Patient Instructions (Signed)

## 2018-08-20 ENCOUNTER — Other Ambulatory Visit: Payer: Self-pay | Admitting: Internal Medicine

## 2018-08-21 ENCOUNTER — Ambulatory Visit (HOSPITAL_COMMUNITY)
Admission: EM | Admit: 2018-08-21 | Discharge: 2018-08-21 | Disposition: A | Discharge status: Home / Self Care | Payer: BC Managed Care – PPO | Attending: Family Medicine | Admitting: Family Medicine

## 2018-08-21 ENCOUNTER — Encounter (HOSPITAL_COMMUNITY): Payer: Self-pay | Admitting: Emergency Medicine

## 2018-08-21 ENCOUNTER — Ambulatory Visit (INDEPENDENT_AMBULATORY_CARE_PROVIDER_SITE_OTHER): Payer: BC Managed Care – PPO

## 2018-08-21 DIAGNOSIS — R0789 Other chest pain: Secondary | ICD-10-CM

## 2018-08-21 DIAGNOSIS — J22 Unspecified acute lower respiratory infection: Secondary | ICD-10-CM

## 2018-08-21 MED ORDER — DM-GUAIFENESIN ER 30-600 MG PO TB12: 1.0000 | ORAL_TABLET | Freq: Two times a day (BID) | ORAL | 0 refills | Status: DC

## 2018-08-21 MED ORDER — AMOXICILLIN 875 MG PO TABS: 875.0000 mg | ORAL_TABLET | Freq: Two times a day (BID) | ORAL | 0 refills | Status: DC

## 2018-08-21 MED ORDER — BENZONATATE 200 MG PO CAPS: 200.0000 mg | ORAL_CAPSULE | Freq: Two times a day (BID) | ORAL | 0 refills | Status: DC | PRN

## 2018-08-21 NOTE — ED Provider Notes (Signed)
[facility]    CSN: [id number] Arrival date & time: 08/21/18  1004     History   Chief Complaint Chief Complaint  Patient presents with  . Cough    HPI [name] is a 48 y.o. female.   HPI  Patient states that she has had a cough for almost 2 weeks.  She went to her PCP on Monday and was given prednisone and cough medicine.  She states that she is still coughing, she is having some chest achiness, she is coughing up some sputum and she feels a pressure sensation in the center of her chest.  She is feeling tired.  She states that she has had some sweats and chills but has not taken her temperature.  She is concerned that she is not improving in spite of treatment.  She does have some underlying allergies.  Usually does not have wheezing.  Her doctor gave her Tussionex and she is coughing in spite of this.  Past Medical History:  Diagnosis Date  . Allergy   . Anxiety   . GERD (gastroesophageal reflux disease)   . Hyperlipidemia   . Hypertension   . PVC (premature ventricular contraction) 07/20/2016  . Sinus tachycardia 07/20/2016    Patient Active Problem List   Diagnosis Date Noted  . Menopausal symptoms 08/30/2017  . HYPERTENSION, BENIGN ESSENTIAL 01/26/2007    Past Surgical History:  Procedure Laterality Date  . ABDOMINAL HYSTERECTOMY    . TUBAL LIGATION    . WISDOM TOOTH EXTRACTION      OB History   No obstetric history on file.      Home Medications    Prior to Admission medications   Medication Sig Start Date End Date Taking? Authorizing Provider  amLODipine (NORVASC) 10 MG tablet TAKE 1 TABLET(10 MG) BY MOUTH DAILY 08/12/18   Lorre Munroe, NP  amoxicillin (AMOXIL) 875 MG tablet Take 1 tablet (875 mg total) by mouth 2 (two) times daily. 08/21/18   Eustace Moore, MD  atorvastatin (LIPITOR) 10 MG tablet TAKE 1 TABLET BY MOUTH DAILY AT 6 PM 04/12/18   Lorre Munroe, NP  atorvastatin (LIPITOR) 10 MG tablet TAKE 1 TABLET BY MOUTH DAILY  AT 6 PM 05/06/18   Baity, Salvadore Oxford, NP  benzonatate (TESSALON) 200 MG capsule Take 1 capsule (200 mg total) by mouth 2 (two) times daily as needed for cough. 08/21/18   Eustace Moore, MD  chlorpheniramine-HYDROcodone [facility] PENNKINETIC ER) 10-8 MG/5ML SUER Take 5 mLs by mouth every 8 (eight) hours as needed. 08/15/18   Lorre Munroe, NP  cyclobenzaprine (FLEXERIL) 10 MG tablet Take 1 tablet (10 mg total) by mouth 3 (three) times daily. 03/09/17   Mardella Layman, MD  dextromethorphan-guaiFENesin (MUCINEX DM) 30-600 MG 12hr tablet Take 1 tablet by mouth 2 (two) times daily. 08/21/18   Eustace Moore, MD  losartan (COZAAR) 25 MG tablet Take 1 tablet (25 mg total) by mouth daily. 08/30/17   Lorre Munroe, NP  promethazine (PHENERGAN) 25 MG suppository Place 1 suppository (25 mg total) rectally every 6 (six) hours as needed for nausea or vomiting. 03/23/17   Lorre Munroe, NP  venlafaxine XR (EFFEXOR-XR) 75 MG 24 hr capsule TAKE 1 CAPSULE(75 MG) BY MOUTH DAILY WITH BREAKFAST 08/30/17   Lorre Munroe, NP    Family History Family History  Problem Relation Age of Onset  . Hypertension Mother   . Heart murmur Father   . Ovarian cancer Paternal  Grandmother   . Lupus Sister   . Colon cancer Neg Hx   . Esophageal cancer Neg Hx   . Pancreatic cancer Neg Hx   . Rectal cancer Neg Hx   . Stomach cancer Neg Hx     Social History Social History   Tobacco Use  . Smoking status: Light Tobacco Smoker    Packs/day: 0.30  . Smokeless tobacco: Never Used  . Tobacco comment: black-n-milds   Substance Use Topics  . Alcohol use: Yes    Comment: occasional   . Drug use: Yes    Types: Marijuana    Comment: occasional      Allergies   Lotrel [amlodipine besy-benazepril hcl]   Review of Systems Review of Systems  Constitutional: Positive for fatigue and fever. Negative for chills.  HENT: Negative for congestion, ear pain and sore throat.   Eyes: Negative for pain and visual disturbance.    Respiratory: Positive for cough and chest tightness. Negative for shortness of breath.   Cardiovascular: Negative for chest pain and palpitations.  Gastrointestinal: Negative for abdominal pain and vomiting.  Genitourinary: Negative for dysuria and hematuria.  Musculoskeletal: Negative for arthralgias and back pain.  Skin: Negative for color change and rash.  Neurological: Negative for seizures and syncope.  All other systems reviewed and are negative.    Physical Exam Triage Vital Signs ED Triage Vitals  Enc Vitals Group     BP 08/21/18 1020 (!) 143/96     Pulse Rate 08/21/18 1020 100     Resp 08/21/18 1020 18     Temp 08/21/18 1020 99 F (37.2 C)     Temp src --      SpO2 08/21/18 1020 96 %     Weight --      Height --      Head Circumference --      Peak Flow --      Pain Score 08/21/18 1022 7     Pain Loc --      Pain Edu? --      Excl. in GC? --    No data found.  Updated Vital Signs BP (!) 143/96   Pulse 100   Temp 99 F (37.2 C)   Resp 18   SpO2 96%      Physical Exam Constitutional:      General: She is not in acute distress.    Appearance: She is well-developed.  HENT:     Head: Normocephalic and atraumatic.  Eyes:     Conjunctiva/sclera: Conjunctivae normal.     Pupils: Pupils are equal, round, and reactive to light.  Neck:     Musculoskeletal: Normal range of motion.  Cardiovascular:     Rate and Rhythm: Normal rate.  Pulmonary:     Effort: Pulmonary effort is normal. No respiratory distress.     Breath sounds: Rhonchi present.     Comments: Central rhonchi.  Few scattered wheezes anteriorly. Abdominal:     General: There is no distension.     Palpations: Abdomen is soft.  Musculoskeletal: Normal range of motion.  Skin:    General: Skin is warm and dry.  Neurological:     Mental Status: She is alert.      UC Treatments / Results  Labs (all labs ordered are listed, but only abnormal results are displayed) Labs Reviewed - No data to  display  EKG None  Radiology Dg Chest 2 View  Result Date: 08/21/2018 CLINICAL DATA:  Sick for 2  weeks.  No fever.  Chest congestion. EXAM: CHEST - 2 VIEW COMPARISON:  August 16, 2015 FINDINGS: The heart, hila, and mediastinum are normal. No pneumothorax. No pulmonary nodules or masses. No focal infiltrates. Haziness at the bases due to overlapping soft tissues. IMPRESSION: No active cardiopulmonary disease. Electronically Signed   By: Gerome Sam III M.D   On: 08/21/2018 11:42    Procedures Procedures (including critical care time)  Medications Ordered in UC Medications - No data to display  Initial Impression / Assessment and Plan / UC Course  I have reviewed the triage vital signs and the nursing notes.  Pertinent labs & imaging results that were available during my care of the patient were reviewed by me and considered in my medical decision making (see chart for details).    I believe that a viral upper respiratory infection should be sure improvement within 7 to 10 days.  Since she is 2 weeks out and continuing to have symptoms, with worsening symptoms I am going to give her a course of antibiotics.  Follow-up with primary care doctor if fails to improve. Final Clinical Impressions(s) / UC Diagnoses   Final diagnoses:  LRTI (lower respiratory tract infection)     Discharge Instructions     Take antibiotic 2 times a day.  Take 2 doses today Take both Tessalon 200 mg and Mucinex DM twice a day Push fluids Use a humidifier if you have one Take ibuprofen or Aleve for chest wall pain or body aches Expect improvement over the next few days   ED Prescriptions    Medication Sig Dispense Auth. Provider   benzonatate (TESSALON) 200 MG capsule Take 1 capsule (200 mg total) by mouth 2 (two) times daily as needed for cough. 20 capsule Eustace Moore, MD   dextromethorphan-guaiFENesin [facility] DM) 30-600 MG 12hr tablet Take 1 tablet by mouth 2 (two) times daily. 20  tablet Eustace Moore, MD   amoxicillin (AMOXIL) 875 MG tablet Take 1 tablet (875 mg total) by mouth 2 (two) times daily. 14 tablet Eustace Moore, MD     Controlled Substance Prescriptions [place] Controlled Substance Registry consulted? Not Applicable   Eustace Moore, MD 08/21/18 2043

## 2018-08-21 NOTE — Discharge Instructions (Signed)
Take antibiotic 2 times a day.  Take 2 doses today Take both Tessalon 200 mg and Mucinex DM twice a day Push fluids Use a humidifier if you have one Take ibuprofen or Aleve for chest wall pain or body aches Expect improvement over the next few days

## 2018-08-21 NOTE — ED Triage Notes (Signed)
Pt states she went to her pcp Monday for cough, given prednisone and put her on cough meds. Pt states she still is coughing, feeling achey.

## 2018-08-29 ENCOUNTER — Encounter: Payer: Self-pay | Admitting: Internal Medicine

## 2018-08-29 ENCOUNTER — Ambulatory Visit (INDEPENDENT_AMBULATORY_CARE_PROVIDER_SITE_OTHER): Payer: BC Managed Care – PPO | Admitting: Internal Medicine

## 2018-08-29 VITALS — BP 126/74 | HR 100 | Temp 98.0°F | Resp 16 | Wt 140.0 lb

## 2018-08-29 DIAGNOSIS — E7800 Pure hypercholesterolemia, unspecified: Secondary | ICD-10-CM

## 2018-08-29 DIAGNOSIS — Z23 Encounter for immunization: Secondary | ICD-10-CM | POA: Diagnosis not present

## 2018-08-29 DIAGNOSIS — N951 Menopausal and female climacteric states: Secondary | ICD-10-CM

## 2018-08-29 DIAGNOSIS — Z0000 Encounter for general adult medical examination without abnormal findings: Secondary | ICD-10-CM

## 2018-08-29 DIAGNOSIS — K219 Gastro-esophageal reflux disease without esophagitis: Secondary | ICD-10-CM | POA: Insufficient documentation

## 2018-08-29 DIAGNOSIS — R5383 Other fatigue: Secondary | ICD-10-CM | POA: Diagnosis not present

## 2018-08-29 DIAGNOSIS — I10 Essential (primary) hypertension: Secondary | ICD-10-CM

## 2018-08-29 DIAGNOSIS — Z1239 Encounter for other screening for malignant neoplasm of breast: Secondary | ICD-10-CM | POA: Diagnosis not present

## 2018-08-29 DIAGNOSIS — E785 Hyperlipidemia, unspecified: Secondary | ICD-10-CM | POA: Insufficient documentation

## 2018-08-29 DIAGNOSIS — E538 Deficiency of other specified B group vitamins: Secondary | ICD-10-CM

## 2018-08-29 DIAGNOSIS — E559 Vitamin D deficiency, unspecified: Secondary | ICD-10-CM

## 2018-08-29 NOTE — Progress Notes (Signed)
Subjective:    Patient ID: [name], female    DOB: June 23, 1971, 48 y.o.   MRN: [id number]  HPI  Pt presents to the clinic today for her annual exam. She is also due to follow up chronic conditions.  HTN: Her BP today is 126/74. She is taking Losartan and Amlodipine as prescribed. ECG from 02/2017 reviewed.  HLD: Her last LDL was 75, 08/2016. She is no longer taking Atorvastatin due to cost. She tries to consume a low fat diet.  GERD: She denies breakthrough on Omeprazole. Upper GI from 12/2007 reviewed.  Menopausal Symptoms: Mainly hot flashes, fatigue and difficulty with memory. She is taking Effexor but does not feel like it is effective.  Flu: 04/2017 Tetanus: > 10 years ago Pap Smear: ? 2012, partial hysterectomy Mammogram: 2016,  Vision Screening: every 2 years Dentist: biannually  Diet: She does eat meat. She consumes some fruits and veggies daily. She tries to avoid fried foods. She drinks mostly mostly water, juices Exercise: None  Review of Systems  Past Medical History:  Diagnosis Date  . Allergy   . Anxiety   . GERD (gastroesophageal reflux disease)   . Hyperlipidemia   . Hypertension   . PVC (premature ventricular contraction) 07/20/2016  . Sinus tachycardia 07/20/2016    Current Outpatient Medications  Medication Sig Dispense Refill  . amLODipine (NORVASC) 10 MG tablet TAKE 1 TABLET(10 MG) BY MOUTH DAILY 90 tablet 0  . amoxicillin (AMOXIL) 875 MG tablet Take 1 tablet (875 mg total) by mouth 2 (two) times daily. 14 tablet 0  . atorvastatin (LIPITOR) 10 MG tablet TAKE 1 TABLET BY MOUTH DAILY AT 6 PM 90 tablet 0  . atorvastatin (LIPITOR) 10 MG tablet TAKE 1 TABLET BY MOUTH DAILY AT 6 PM 90 tablet 0  . benzonatate (TESSALON) 200 MG capsule Take 1 capsule (200 mg total) by mouth 2 (two) times daily as needed for cough. 20 capsule 0  . chlorpheniramine-HYDROcodone (TUSSIONEX PENNKINETIC ER) 10-8 MG/5ML SUER Take 5 mLs by mouth every 8 (eight) hours as  needed. 140 mL 0  . cyclobenzaprine (FLEXERIL) 10 MG tablet Take 1 tablet (10 mg total) by mouth 3 (three) times daily. 20 tablet 0  . dextromethorphan-guaiFENesin (MUCINEX DM) 30-600 MG 12hr tablet Take 1 tablet by mouth 2 (two) times daily. 20 tablet 0  . losartan (COZAAR) 25 MG tablet Take 1 tablet (25 mg total) by mouth daily. 90 tablet 3  . omeprazole (PRILOSEC) 20 MG capsule TAKE 1 CAPSULE(20 MG) BY MOUTH DAILY 90 capsule 1  . promethazine (PHENERGAN) 25 MG suppository Place 1 suppository (25 mg total) rectally every 6 (six) hours as needed for nausea or vomiting. 12 each 0  . venlafaxine XR (EFFEXOR-XR) 75 MG 24 hr capsule TAKE 1 CAPSULE(75 MG) BY MOUTH DAILY WITH BREAKFAST 90 capsule 3   No current facility-administered medications for this visit.     Allergies  Allergen Reactions  . Lotrel [Amlodipine Besy-Benazepril Hcl] Cough    Family History  Problem Relation Age of Onset  . Hypertension Mother   . Heart murmur Father   . Ovarian cancer Paternal Grandmother   . Lupus Sister   . Colon cancer Neg Hx   . Esophageal cancer Neg Hx   . Pancreatic cancer Neg Hx   . Rectal cancer Neg Hx   . Stomach cancer Neg Hx     Social History   Socioeconomic History  . Marital status: Married    Spouse name: Not on  file  . Number of children: Not on file  . Years of education: Not on file  . Highest education level: Not on file  Occupational History  . Not on file  Social Needs  . Financial resource strain: Not on file  . Food insecurity:    Worry: Not on file    Inability: Not on file  . Transportation needs:    Medical: Not on file    Non-medical: Not on file  Tobacco Use  . Smoking status: Light Tobacco Smoker    Packs/day: 0.30  . Smokeless tobacco: Never Used  . Tobacco comment: black-n-milds   Substance and Sexual Activity  . Alcohol use: Yes    Comment: occasional   . Drug use: Yes    Types: Marijuana    Comment: occasional   . Sexual activity: Yes    Birth  control/protection: Surgical  Lifestyle  . Physical activity:    Days per week: Not on file    Minutes per session: Not on file  . Stress: Not on file  Relationships  . Social connections:    Talks on phone: Not on file    Gets together: Not on file    Attends religious service: Not on file    Active member of club or organization: Not on file    Attends meetings of clubs or organizations: Not on file    Relationship status: Not on file  . Intimate partner violence:    Fear of current or ex partner: Not on file    Emotionally abused: Not on file    Physically abused: Not on file    Forced sexual activity: Not on file  Other Topics Concern  . Not on file  Social History Narrative  . Not on file     Constitutional: Pt reports fatigue. Denies fever, malaise, headache or abrupt weight changes.  HEENT: Denies eye pain, eye redness, ear pain, ringing in the ears, wax buildup, runny nose, nasal congestion, bloody nose, or sore throat. Respiratory: Denies difficulty breathing, shortness of breath, cough or sputum production.   Cardiovascular: Denies chest pain, chest tightness, palpitations or swelling in the hands or feet.  Gastrointestinal: Pt reports intermittent reflux. Denies abdominal pain, bloating, constipation, diarrhea or blood in the stool.  GU: Denies urgency, frequency, pain with urination, burning sensation, blood in urine, odor or discharge. Musculoskeletal: Denies decrease in range of motion, difficulty with gait, muscle pain or joint pain and swelling.  Skin: Denies redness, rashes, lesions or ulcercations.  Neurological: Pt reports hot flashes, difficulty with memory. Denies dizziness, difficulty with speech or problems with balance and coordination.  Psych: Denies anxiety, depression, SI/HI.  No other specific complaints in a complete review of systems (except as listed in HPI above).     Objective:   Physical Exam  BP 126/74   Pulse 100   Temp 98 F (36.7 C)  (Oral)   Resp 16   Wt 140 lb (63.5 kg)   SpO2 97%   BMI 24.41 kg/m  Wt Readings from Last 3 Encounters:  08/29/18 140 lb (63.5 kg)  08/15/18 145 lb (65.8 kg)  05/26/18 135 lb (61.2 kg)    General: Appears her stated age, well developed, well nourished in NAD. Skin: Warm, dry and intact.  HEENT: Head: normal shape and size; Eyes: sclera white, no icterus, conjunctiva pink, PERRLA and EOMs intact; Ears: Tm's gray and intact, normal light reflex;  Throat/Mouth: Teeth present, mucosa pink and moist, no exudate, lesions or  ulcerations noted.  Neck:  Neck supple, trachea midline. No masses, lumps or thyromegaly present.  Cardiovascular: Normal rate and rhythm. S1,S2 noted.  No murmur, rubs or gallops noted. No JVD or BLE edema.  Pulmonary/Chest: Normal effort and positive vesicular breath sounds. No respiratory distress. No wheezes, rales or ronchi noted.  Abdomen: Soft and nontender. Normal bowel sounds. No distention or masses noted. Liver, spleen and kidneys non palpable. Musculoskeletal: Strength 5/5 BUE/BLE. No difficulty with gait.  Neurological: Alert and oriented. Cranial nerves II-XII grossly intact. Coordination normal.  Psychiatric: Mood and affect normal. Behavior is normal. Judgment and thought content normal.    BMET    Component Value Date/Time   NA 139 04/05/2017 1206   K 4.9 04/27/2017 0921   CL 98 04/05/2017 1206   CO2 29 04/05/2017 1206   GLUCOSE 90 04/05/2017 1206   BUN 10 04/05/2017 1206   CREATININE 0.62 04/05/2017 1206   CALCIUM 9.7 04/05/2017 1206   GFRNONAA >60 02/06/2017 0548   GFRAA >60 02/06/2017 0548    Lipid Panel     Component Value Date/Time   CHOL 154 08/28/2016 1016   TRIG 112.0 08/28/2016 1016   HDL 56.60 08/28/2016 1016   CHOLHDL 3 08/28/2016 1016   VLDL 22.4 08/28/2016 1016   LDLCALC 75 08/28/2016 1016    CBC    Component Value Date/Time   WBC 13.0 (H) 04/13/2017 1449   RBC 4.17 04/13/2017 1449   HGB 12.8 04/13/2017 1449   HGB  12.3 05/10/2007 1305   HCT 37.7 04/13/2017 1449   HCT 35.8 05/10/2007 1305   PLT 500.0 (H) 04/13/2017 1449   PLT 515 (H) 05/10/2007 1305   MCV 90.4 04/13/2017 1449   MCV 82.8 05/10/2007 1305   MCH 29.5 02/06/2017 0548   MCHC 33.9 04/13/2017 1449   RDW 14.8 04/13/2017 1449   RDW 15.1 (H) 05/10/2007 1305   LYMPHSABS 3.1 04/13/2017 1449   LYMPHSABS 2.4 05/10/2007 1305   MONOABS 0.6 04/13/2017 1449   MONOABS 0.4 05/10/2007 1305   EOSABS 0.1 04/13/2017 1449   EOSABS 0.1 05/10/2007 1305   BASOSABS 0.1 04/13/2017 1449   BASOSABS 0.0 05/10/2007 1305    Hgb A1C No results found for: HGBA1C          Assessment & Plan:   Preventative Health Maintenance:  Flu shot today Tdap today Mammogram ordered, she will call [facility] to schedule, number provided She declines pelvic exam today Encouraged her to consume a balanced diet and exercise regimen Advised her to see an eye doctor and dentist annually Will check CBC, CMET, Lipid, TSH, Vit D and B12 today  Fatigue:  Will check CBC, CMET, TSH, Vit D and B12 today  RTC in 1 year, sooner if needed Nicki Reaper, NP

## 2018-08-29 NOTE — Addendum Note (Signed)
Addended by: Larry Sierras on: 08/29/2018 03:55 PM   Modules accepted: Orders

## 2018-08-29 NOTE — Assessment & Plan Note (Signed)
Not well controlled on Effexor Consider increasing dose vs changing to Paxil pending labs

## 2018-08-29 NOTE — Assessment & Plan Note (Signed)
CMET and Lipid profile today Encouraged her to consume a low fat diet May need to restart Atorvastatin pending labs

## 2018-08-29 NOTE — Assessment & Plan Note (Signed)
Controlled on Amlodipine and Losartan Reinforced DASH diet CMET today

## 2018-08-29 NOTE — Assessment & Plan Note (Signed)
Advised her to take Omeprazole daily CBC and CMET today

## 2018-08-29 NOTE — Patient Instructions (Signed)

## 2018-08-30 LAB — LIPID PANEL
Cholesterol: 177 mg/dL (ref 0–200)
HDL: 55.1 mg/dL (ref >39.00)
NonHDL: 122.39
Total CHOL/HDL Ratio: 3
Triglycerides: 354 mg/dL — ABNORMAL HIGH (ref 0.0–149.0)
VLDL: 70.8 mg/dL — ABNORMAL HIGH (ref 0.0–40.0)

## 2018-08-30 LAB — COMPREHENSIVE METABOLIC PANEL
ALT: 21 U/L (ref 0–35)
AST: 24 U/L (ref 0–37)
Albumin: 4.6 g/dL (ref 3.5–5.2)
Alkaline Phosphatase: 101 U/L (ref 39–117)
BUN: 12 mg/dL (ref 6–23)
CO2: 26 mEq/L (ref 19–32)
Calcium: 9.3 mg/dL (ref 8.4–10.5)
Chloride: 101 mEq/L (ref 96–112)
Creatinine, Ser: 0.62 mg/dL (ref 0.40–1.20)
GFR: 123.95 mL/min (ref >60.00)
Glucose, Bld: 80 mg/dL (ref 70–99)
Potassium: 3.2 mEq/L — ABNORMAL LOW (ref 3.5–5.1)
Sodium: 139 mEq/L (ref 135–145)
Total Bilirubin: 0.5 mg/dL (ref 0.2–1.2)
Total Protein: 8.4 g/dL — ABNORMAL HIGH (ref 6.0–8.3)

## 2018-08-30 LAB — CBC
HCT: 37.9 % (ref 36.0–46.0)
Hemoglobin: 12.6 g/dL (ref 12.0–15.0)
MCHC: 33.3 g/dL (ref 30.0–36.0)
MCV: 88.7 fl (ref 78.0–100.0)
Platelets: 560 10*3/uL — ABNORMAL HIGH (ref 150.0–400.0)
RBC: 4.28 Mil/uL (ref 3.87–5.11)
RDW: 16.4 % — ABNORMAL HIGH (ref 11.5–15.5)
WBC: 11.3 10*3/uL — ABNORMAL HIGH (ref 4.0–10.5)

## 2018-08-30 LAB — VITAMIN B12: Vitamin B-12: 140 pg/mL — ABNORMAL LOW (ref 211–911)

## 2018-08-30 LAB — VITAMIN D 25 HYDROXY (VIT D DEFICIENCY, FRACTURES): VITD: 9.64 ng/mL — ABNORMAL LOW (ref 30.00–100.00)

## 2018-08-30 LAB — LDL CHOLESTEROL, DIRECT: LDL DIRECT: 84 mg/dL

## 2018-08-30 LAB — TSH: TSH: 1.35 u[IU]/mL (ref 0.35–4.50)

## 2018-09-03 ENCOUNTER — Other Ambulatory Visit: Payer: Self-pay | Admitting: Internal Medicine

## 2018-09-05 MED ORDER — ATORVASTATIN CALCIUM 10 MG PO TABS: 10.0000 mg | ORAL_TABLET | Freq: Every day | ORAL | 0 refills | Status: DC

## 2018-09-05 MED ORDER — VITAMIN D (ERGOCALCIFEROL) 1.25 MG (50000 UNIT) PO CAPS: 50000.0000 [IU] | ORAL_CAPSULE | ORAL | 0 refills | Status: DC

## 2018-09-05 NOTE — Addendum Note (Signed)
Addended by: Roena Malady on: 09/05/2018 09:52 AM   Modules accepted: Orders

## 2018-09-11 ENCOUNTER — Other Ambulatory Visit: Payer: Self-pay | Admitting: Internal Medicine

## 2018-09-19 ENCOUNTER — Other Ambulatory Visit: Payer: Self-pay | Admitting: Internal Medicine

## 2018-09-21 ENCOUNTER — Ambulatory Visit: Payer: BC Managed Care – PPO

## 2018-09-22 ENCOUNTER — Other Ambulatory Visit: Payer: Self-pay | Admitting: Internal Medicine

## 2018-09-22 DIAGNOSIS — E876 Hypokalemia: Secondary | ICD-10-CM

## 2018-09-28 ENCOUNTER — Other Ambulatory Visit: Payer: BC Managed Care – PPO

## 2018-11-10 ENCOUNTER — Other Ambulatory Visit: Payer: Self-pay | Admitting: Internal Medicine

## 2018-12-01 ENCOUNTER — Other Ambulatory Visit: Payer: Self-pay | Admitting: Internal Medicine

## 2018-12-09 ENCOUNTER — Other Ambulatory Visit: Payer: Self-pay | Admitting: Internal Medicine

## 2019-02-12 ENCOUNTER — Other Ambulatory Visit: Payer: Self-pay | Admitting: Internal Medicine

## 2019-02-17 ENCOUNTER — Ambulatory Visit: Payer: BC Managed Care – PPO | Admitting: Internal Medicine

## 2019-02-17 DIAGNOSIS — Z0289 Encounter for other administrative examinations: Secondary | ICD-10-CM

## 2019-02-17 NOTE — Progress Notes (Deleted)
Subjective:    Patient ID: [name], female    DOB: 1970-11-02, 48 y.o.   MRN: [id number]  HPI  Pt presents to the clinic today to discuss. FSH/LH from 12/2015 reviewed.  Review of Systems      Past Medical History:  Diagnosis Date  . Allergy   . Anxiety   . GERD (gastroesophageal reflux disease)   . Hyperlipidemia   . Hypertension   . PVC (premature ventricular contraction) 07/20/2016  . Sinus tachycardia 07/20/2016    Current Outpatient Medications  Medication Sig Dispense Refill  . amLODipine (NORVASC) 10 MG tablet TAKE 1 TABLET(10 MG) BY MOUTH DAILY 90 tablet 0  . atorvastatin (LIPITOR) 10 MG tablet Take 1 tablet (10 mg total) by mouth daily at 6 PM. MUST SCHEDULE LAB ONLY VISIT 90 tablet 0  . benzonatate (TESSALON) 200 MG capsule Take 1 capsule (200 mg total) by mouth 2 (two) times daily as needed for cough. 20 capsule 0  . cyclobenzaprine (FLEXERIL) 10 MG tablet Take 1 tablet (10 mg total) by mouth 3 (three) times daily. 20 tablet 0  . dextromethorphan-guaiFENesin (MUCINEX DM) 30-600 MG 12hr tablet Take 1 tablet by mouth 2 (two) times daily. 20 tablet 0  . losartan (COZAAR) 25 MG tablet TAKE 1 TABLET(25 MG) BY MOUTH DAILY 90 tablet 2  . omeprazole (PRILOSEC) 20 MG capsule TAKE 1 CAPSULE(20 MG) BY MOUTH DAILY 90 capsule 1  . venlafaxine XR (EFFEXOR-XR) 75 MG 24 hr capsule TAKE 1 CAPSULE(75 MG) BY MOUTH DAILY WITH BREAKFAST 90 capsule 2  . Vitamin D, Ergocalciferol, (DRISDOL) 1.25 MG (50000 UT) CAPS capsule Take 1 capsule (50,000 Units total) by mouth every 7 (seven) days. 12 capsule 0   No current facility-administered medications for this visit.     Allergies  Allergen Reactions  . Lotrel [Amlodipine Besy-Benazepril Hcl] Cough    Family History  Problem Relation Age of Onset  . Hypertension Mother   . Heart murmur Father   . Ovarian cancer Paternal Grandmother   . Lupus Sister   . Colon cancer Neg Hx   . Esophageal cancer Neg Hx   . Pancreatic cancer  Neg Hx   . Rectal cancer Neg Hx   . Stomach cancer Neg Hx     Social History   Socioeconomic History  . Marital status: Married    Spouse name: Not on file  . Number of children: Not on file  . Years of education: Not on file  . Highest education level: Not on file  Occupational History  . Not on file  Social Needs  . Financial resource strain: Not on file  . Food insecurity    Worry: Not on file    Inability: Not on file  . Transportation needs    Medical: Not on file    Non-medical: Not on file  Tobacco Use  . Smoking status: Light Tobacco Smoker    Packs/day: 0.30  . Smokeless tobacco: Never Used  . Tobacco comment: black-n-milds   Substance and Sexual Activity  . Alcohol use: Yes    Comment: occasional   . Drug use: Yes    Types: Marijuana    Comment: occasional   . Sexual activity: Yes    Birth control/protection: Surgical  Lifestyle  . Physical activity    Days per week: Not on file    Minutes per session: Not on file  . Stress: Not on file  Relationships  . Social Musicianconnections    Talks on  phone: Not on file    Gets together: Not on file    Attends religious service: Not on file    Active member of club or organization: Not on file    Attends meetings of clubs or organizations: Not on file    Relationship status: Not on file  . Intimate partner violence    Fear of current or ex partner: Not on file    Emotionally abused: Not on file    Physically abused: Not on file    Forced sexual activity: Not on file  Other Topics Concern  . Not on file  Social History Narrative  . Not on file     Constitutional: Denies fever, malaise, fatigue, headache or abrupt weight changes.  HEENT: Denies eye pain, eye redness, ear pain, ringing in the ears, wax buildup, runny nose, nasal congestion, bloody nose, or sore throat. Respiratory: Denies difficulty breathing, shortness of breath, cough or sputum production.   Cardiovascular: Denies chest pain, chest tightness,  palpitations or swelling in the hands or feet.  Gastrointestinal: Denies abdominal pain, bloating, constipation, diarrhea or blood in the stool.  GU: Denies urgency, frequency, pain with urination, burning sensation, blood in urine, odor or discharge. Musculoskeletal: Denies decrease in range of motion, difficulty with gait, muscle pain or joint pain and swelling.  Skin: Denies redness, rashes, lesions or ulcercations.  Neurological: Denies dizziness, difficulty with memory, difficulty with speech or problems with balance and coordination.  Psych: Denies anxiety, depression, SI/HI.  No other specific complaints in a complete review of systems (except as listed in HPI above).  Objective:   Physical Exam        Assessment & Plan:

## 2019-03-19 ENCOUNTER — Other Ambulatory Visit: Payer: Self-pay | Admitting: Internal Medicine

## 2019-03-23 ENCOUNTER — Ambulatory Visit (INDEPENDENT_AMBULATORY_CARE_PROVIDER_SITE_OTHER): Payer: BC Managed Care – PPO | Admitting: Internal Medicine

## 2019-03-23 ENCOUNTER — Encounter: Payer: Self-pay | Admitting: Internal Medicine

## 2019-03-23 DIAGNOSIS — R21 Rash and other nonspecific skin eruption: Secondary | ICD-10-CM | POA: Diagnosis not present

## 2019-03-23 NOTE — Patient Instructions (Signed)
Rash, Adult  A rash is a change in the color of your skin. A rash can also change the way your skin feels. There are many different conditions and factors that can cause a rash. Follow these instructions at home: The goal of treatment is to stop the itching and keep the rash from spreading. Watch for any changes in your symptoms. Let your doctor know about them. Follow these instructions to help with your condition: Medicine Take or apply over-the-counter and prescription medicines only as told by your doctor. These may include medicines:  To treat red or swollen skin (corticosteroid creams).  To treat itching.  To treat an allergy (oral antihistamines).  To treat very bad symptoms (oral corticosteroids).  Skin care  Put cool cloths (compresses) on the affected areas.  Do not scratch or rub your skin.  Avoid covering the rash. Make sure that the rash is exposed to air as much as possible. Managing itching and discomfort  Avoid hot showers or baths. These can make itching worse. A cold shower may help.  Try taking a bath with: ? Epsom salts. You can get these at your local pharmacy or grocery store. Follow the instructions on the package. ? Baking soda. Pour a small amount into the bath as told by your doctor. ? Colloidal oatmeal. You can get this at your local pharmacy or grocery store. Follow the instructions on the package.  Try putting baking soda paste onto your skin. Stir water into baking soda until it gets like a paste.  Try putting on a lotion that relieves itchiness (calamine lotion).  Keep cool and out of the sun. Sweating and being hot can make itching worse. General instructions   Rest as needed.  Drink enough fluid to keep your pee (urine) pale yellow.  Wear loose-fitting clothing.  Avoid scented soaps, detergents, and perfumes. Use gentle soaps, detergents, perfumes, and other cosmetic products.  Avoid anything that causes your rash. Keep a journal to  help track what causes your rash. Write down: ? What you eat. ? What cosmetic products you use. ? What you drink. ? What you wear. This includes jewelry.  Keep all follow-up visits as told by your doctor. This is important. Contact a doctor if:  You sweat at night.  You lose weight.  You pee (urinate) more than normal.  You pee less than normal, or you notice that your pee is a darker color than normal.  You feel weak.  You throw up (vomit).  Your skin or the whites of your eyes look yellow (jaundice).  Your skin: ? Tingles. ? Is numb.  Your rash: ? Does not go away after a few days. ? Gets worse.  You are: ? More thirsty than normal. ? More tired than normal.  You have: ? New symptoms. ? Pain in your belly (abdomen). ? A fever. ? Watery poop (diarrhea). Get help right away if:  You have a fever and your symptoms suddenly get worse.  You start to feel mixed up (confused).  You have a very bad headache or a stiff neck.  You have very bad joint pains or stiffness.  You have jerky movements that you cannot control (seizure).  Your rash covers all or most of your body. The rash may or may not be painful.  You have blisters that: ? Are on top of the rash. ? Grow larger. ? Grow together. ? Are painful. ? Are inside your nose or mouth.  You have a rash   that: ? Looks like purple pinprick-sized spots all over your body. ? Has a "bull's eye" or looks like a target. ? Is red and painful, causes your skin to peel, and is not from being in the sun too long. Summary  A rash is a change in the color of your skin. A rash can also change the way your skin feels.  The goal of treatment is to stop the itching and keep the rash from spreading.  Take or apply over-the-counter and prescription medicines only as told by your doctor.  Contact a doctor if you have new symptoms or symptoms that get worse.  Keep all follow-up visits as told by your doctor. This is  important. This information is not intended to replace advice given to you by your health care provider. Make sure you discuss any questions you have with your health care provider. Document Released: 12/09/2007 Document Revised: 10/14/2018 Document Reviewed: 01/24/2018 Elsevier Patient Education  2020 Elsevier Inc[employer]  

## 2019-03-23 NOTE — Progress Notes (Signed)
Virtual Visit via Video Note  I connected with [name] on 03/23/19 at  2:15 PM EDT by a video enabled telemedicine application and verified that I am speaking with the correct person using two identifiers.  Location: Patient: Work Provider: Office   I discussed the limitations of evaluation and management by telemedicine and the availability of in person appointments. The patient expressed understanding and agreed to proceed.  History of Present Illness:  Pt reports rash to right elbow. She noticed this 5 days ago. She reports the rash looks like a cluster of bumps. She reports the rash itches and is sore. She has not noticed any discharge from the area. The rash has not spread. No one in her home has had a similar rash. She denies changes in soaps, lotions or detergents. She denies recent insect bites. She has tried Peroxide, Alcohol and antibiotic cream.    Past Medical History:  Diagnosis Date  . Allergy   . Anxiety   . GERD (gastroesophageal reflux disease)   . Hyperlipidemia   . Hypertension   . PVC (premature ventricular contraction) 07/20/2016  . Sinus tachycardia 07/20/2016    Current Outpatient Medications  Medication Sig Dispense Refill  . amLODipine (NORVASC) 10 MG tablet TAKE 1 TABLET(10 MG) BY MOUTH DAILY 90 tablet 0  . atorvastatin (LIPITOR) 10 MG tablet TAKE 1 TABLET(10 MG) BY MOUTH DAILY AT 6 PM 90 tablet 0  . benzonatate (TESSALON) 200 MG capsule Take 1 capsule (200 mg total) by mouth 2 (two) times daily as needed for cough. 20 capsule 0  . cyclobenzaprine (FLEXERIL) 10 MG tablet Take 1 tablet (10 mg total) by mouth 3 (three) times daily. 20 tablet 0  . dextromethorphan-guaiFENesin (MUCINEX DM) 30-600 MG 12hr tablet Take 1 tablet by mouth 2 (two) times daily. 20 tablet 0  . losartan (COZAAR) 25 MG tablet TAKE 1 TABLET(25 MG) BY MOUTH DAILY 90 tablet 2  . omeprazole (PRILOSEC) 20 MG capsule TAKE 1 CAPSULE(20 MG) BY MOUTH DAILY 90 capsule 1  . venlafaxine XR  (EFFEXOR-XR) 75 MG 24 hr capsule TAKE 1 CAPSULE(75 MG) BY MOUTH DAILY WITH BREAKFAST 90 capsule 2  . Vitamin D, Ergocalciferol, (DRISDOL) 1.25 MG (50000 UT) CAPS capsule Take 1 capsule (50,000 Units total) by mouth every 7 (seven) days. 12 capsule 0   No current facility-administered medications for this visit.     Allergies  Allergen Reactions  . Lotrel [Amlodipine Besy-Benazepril Hcl] Cough    Family History  Problem Relation Age of Onset  . Hypertension Mother   . Heart murmur Father   . Ovarian cancer Paternal Grandmother   . Lupus Sister   . Colon cancer Neg Hx   . Esophageal cancer Neg Hx   . Pancreatic cancer Neg Hx   . Rectal cancer Neg Hx   . Stomach cancer Neg Hx     Social History   Socioeconomic History  . Marital status: Married    Spouse name: Not on file  . Number of children: Not on file  . Years of education: Not on file  . Highest education level: Not on file  Occupational History  . Not on file  Social Needs  . Financial resource strain: Not on file  . Food insecurity    Worry: Not on file    Inability: Not on file  . Transportation needs    Medical: Not on file    Non-medical: Not on file  Tobacco Use  . Smoking status: Light Tobacco  Smoker    Packs/day: 0.30  . Smokeless tobacco: Never Used  . Tobacco comment: black-n-milds   Substance and Sexual Activity  . Alcohol use: Yes    Comment: occasional   . Drug use: Yes    Types: Marijuana    Comment: occasional   . Sexual activity: Yes    Birth control/protection: Surgical  Lifestyle  . Physical activity    Days per week: Not on file    Minutes per session: Not on file  . Stress: Not on file  Relationships  . Social Musicianconnections    Talks on phone: Not on file    Gets together: Not on file    Attends religious service: Not on file    Active member of club or organization: Not on file    Attends meetings of clubs or organizations: Not on file    Relationship status: Not on file  .  Intimate partner violence    Fear of current or ex partner: Not on file    Emotionally abused: Not on file    Physically abused: Not on file    Forced sexual activity: Not on file  Other Topics Concern  . Not on file  Social History Narrative  . Not on file     Constitutional: Denies fever, malaise, fatigue, headache or abrupt weight changes.  Skin: Pt reports rash of right elbow. Denies ulcercations.    No other specific complaints in a complete review of systems (except as listed in HPI above).  Observations/Objective:   Wt Readings from Last 3 Encounters:  08/29/18 140 lb (63.5 kg)  08/15/18 145 lb (65.8 kg)  05/26/18 135 lb (61.2 kg)    General: Appears her stated age, well developed, well nourished in NAD. Skin: Grouped papular lesions.  Neurological: Alert and oriented.   BMET    Component Value Date/Time   NA 139 08/29/2018 1543   K 3.2 (L) 08/29/2018 1543   CL 101 08/29/2018 1543   CO2 26 08/29/2018 1543   GLUCOSE 80 08/29/2018 1543   BUN 12 08/29/2018 1543   CREATININE 0.62 08/29/2018 1543   CALCIUM 9.3 08/29/2018 1543   GFRNONAA >60 02/06/2017 0548   GFRAA >60 02/06/2017 0548    Lipid Panel     Component Value Date/Time   CHOL 177 08/29/2018 1543   TRIG 354.0 (H) 08/29/2018 1543   HDL 55.10 08/29/2018 1543   CHOLHDL 3 08/29/2018 1543   VLDL 70.8 (H) 08/29/2018 1543   LDLCALC 75 08/28/2016 1016    CBC    Component Value Date/Time   WBC 11.3 (H) 08/29/2018 1543   RBC 4.28 08/29/2018 1543   HGB 12.6 08/29/2018 1543   HGB 12.3 05/10/2007 1305   HCT 37.9 08/29/2018 1543   HCT 35.8 05/10/2007 1305   PLT 560.0 (H) 08/29/2018 1543   PLT 515 (H) 05/10/2007 1305   MCV 88.7 08/29/2018 1543   MCV 82.8 05/10/2007 1305   MCH 29.5 02/06/2017 0548   MCHC 33.3 08/29/2018 1543   RDW 16.4 (H) 08/29/2018 1543   RDW 15.1 (H) 05/10/2007 1305   LYMPHSABS 3.1 04/13/2017 1449   LYMPHSABS 2.4 05/10/2007 1305   MONOABS 0.6 04/13/2017 1449   MONOABS 0.4  05/10/2007 1305   EOSABS 0.1 04/13/2017 1449   EOSABS 0.1 05/10/2007 1305   BASOSABS 0.1 04/13/2017 1449   BASOSABS 0.0 05/10/2007 1305    Hgb A1C No results found for: HGBA1C      Assessment and Plan:  Rash:  Try  Hydrocortisone Cream 0.1% BID If no improvement, make in office follow up for further eval.  Follow Up Instructions:    I discussed the assessment and treatment plan with the patient. The patient was provided an opportunity to ask questions and all were answered. The patient agreed with the plan and demonstrated an understanding of the instructions.   The patient was advised to call back or seek an in-person evaluation if the symptoms worsen or if the condition fails to improve as anticipated.     Webb Silversmith, NP

## 2019-03-26 ENCOUNTER — Other Ambulatory Visit: Payer: Self-pay | Admitting: Internal Medicine

## 2019-03-28 ENCOUNTER — Telehealth: Payer: Self-pay

## 2019-03-28 NOTE — Telephone Encounter (Signed)
This morning pt noticed some tenderness,soreness and redness under rt eye. Now pt has a cluster of bumps under mid rt eye at lashes, no blisters yet; continues with tenderness, soreness and redness with slight swelling. Avie Echevaria NP said pt should see ophthalmologist today and if cannot see ophthalmologist today pt should go to UC now and FU with ophthalmologist tomorrow; Pt has seen Dr Rutherford Guys in [facility] but it has been over one yr since seen. I called Dr Kellie Moor office at pt request [phone] and spoke with [place];  Dr Gershon Crane is on vacation this wk and there is no provider to cover for him. Pt will go to UC at [facility] now for eval and treat and pt is to call Dr Kellie Moor office for FU next wk and adhere to advise of UC provider as well. Pt voiced understanding and appreciative.FYI to Avie Echevaria NP.

## 2019-03-29 ENCOUNTER — Ambulatory Visit: Payer: BC Managed Care – PPO | Admitting: Internal Medicine

## 2019-03-29 NOTE — Telephone Encounter (Signed)
Agree with advice given

## 2019-04-19 ENCOUNTER — Telehealth: Payer: Self-pay

## 2019-04-19 ENCOUNTER — Ambulatory Visit (INDEPENDENT_AMBULATORY_CARE_PROVIDER_SITE_OTHER): Payer: BC Managed Care – PPO | Admitting: Internal Medicine

## 2019-04-19 ENCOUNTER — Encounter: Payer: Self-pay | Admitting: Internal Medicine

## 2019-04-19 VITALS — Temp 98.6°F

## 2019-04-19 DIAGNOSIS — B9789 Other viral agents as the cause of diseases classified elsewhere: Secondary | ICD-10-CM

## 2019-04-19 DIAGNOSIS — R059 Cough, unspecified: Secondary | ICD-10-CM

## 2019-04-19 DIAGNOSIS — R05 Cough: Secondary | ICD-10-CM | POA: Diagnosis not present

## 2019-04-19 DIAGNOSIS — J329 Chronic sinusitis, unspecified: Secondary | ICD-10-CM | POA: Diagnosis not present

## 2019-04-19 MED ORDER — HYDROCOD POLST-CPM POLST ER 10-8 MG/5ML PO SUER: 5.0000 mL | Freq: Every evening | ORAL | 0 refills | Status: DC | PRN

## 2019-04-19 NOTE — Telephone Encounter (Signed)
Will discuss at upcoming appt.

## 2019-04-19 NOTE — Patient Instructions (Signed)

## 2019-04-19 NOTE — Progress Notes (Signed)
Virtual Visit via Video Note  I connected with [name] on 04/19/19 at  4:30 PM EDT by a video enabled telemedicine application and verified that I am speaking with the correct person using two identifiers.  Location: Patient: Home Provider: Office   I discussed the limitations of evaluation and management by telemedicine and the availability of in person appointments. The patient expressed understanding and agreed to proceed.  History of Present Illness:  Pt reports sinus pressure post nasal drip and cough. This started 8 days ago. The cough is productive of clear mucous. The cough seems worse at night. She denies headache, runny nose, nasal congestion, ear pain, sore throat or shortness of breath. She denies fever, chills, nausea or loss of taste or smell. She has tried Robitussion/Xyzal with minimal relief. She has not had sick contacts or exposure to COVID that she is aware of.    Past Medical History:  Diagnosis Date  . Allergy   . Anxiety   . GERD (gastroesophageal reflux disease)   . Hyperlipidemia   . Hypertension   . PVC (premature ventricular contraction) 07/20/2016  . Sinus tachycardia 07/20/2016    Current Outpatient Medications  Medication Sig Dispense Refill  . amLODipine (NORVASC) 10 MG tablet TAKE 1 TABLET(10 MG) BY MOUTH DAILY 90 tablet 0  . atorvastatin (LIPITOR) 10 MG tablet TAKE 1 TABLET(10 MG) BY MOUTH DAILY AT 6 PM 90 tablet 0  . benzonatate (TESSALON) 200 MG capsule Take 1 capsule (200 mg total) by mouth 2 (two) times daily as needed for cough. 20 capsule 0  . cyclobenzaprine (FLEXERIL) 10 MG tablet Take 1 tablet (10 mg total) by mouth 3 (three) times daily. 20 tablet 0  . dextromethorphan-guaiFENesin (MUCINEX DM) 30-600 MG 12hr tablet Take 1 tablet by mouth 2 (two) times daily. 20 tablet 0  . losartan (COZAAR) 25 MG tablet TAKE 1 TABLET(25 MG) BY MOUTH DAILY 90 tablet 2  . omeprazole (PRILOSEC) 20 MG capsule TAKE 1 CAPSULE(20 MG) BY MOUTH DAILY 90  capsule 0  . venlafaxine XR (EFFEXOR-XR) 75 MG 24 hr capsule TAKE 1 CAPSULE(75 MG) BY MOUTH DAILY WITH BREAKFAST 90 capsule 2  . Vitamin D, Ergocalciferol, (DRISDOL) 1.25 MG (50000 UT) CAPS capsule Take 1 capsule (50,000 Units total) by mouth every 7 (seven) days. 12 capsule 0   No current facility-administered medications for this visit.     Allergies  Allergen Reactions  . Lotrel [Amlodipine Besy-Benazepril Hcl] Cough    Family History  Problem Relation Age of Onset  . Hypertension Mother   . Heart murmur Father   . Ovarian cancer Paternal Grandmother   . Lupus Sister   . Colon cancer Neg Hx   . Esophageal cancer Neg Hx   . Pancreatic cancer Neg Hx   . Rectal cancer Neg Hx   . Stomach cancer Neg Hx     Social History   Socioeconomic History  . Marital status: Married    Spouse name: Not on file  . Number of children: Not on file  . Years of education: Not on file  . Highest education level: Not on file  Occupational History  . Not on file  Social Needs  . Financial resource strain: Not on file  . Food insecurity    Worry: Not on file    Inability: Not on file  . Transportation needs    Medical: Not on file    Non-medical: Not on file  Tobacco Use  . Smoking status: Light Tobacco  Smoker    Packs/day: 0.30  . Smokeless tobacco: Never Used  . Tobacco comment: black-n-milds   Substance and Sexual Activity  . Alcohol use: Yes    Comment: occasional   . Drug use: Yes    Types: Marijuana    Comment: occasional   . Sexual activity: Yes    Birth control/protection: Surgical  Lifestyle  . Physical activity    Days per week: Not on file    Minutes per session: Not on file  . Stress: Not on file  Relationships  . Social Musicianconnections    Talks on phone: Not on file    Gets together: Not on file    Attends religious service: Not on file    Active member of club or organization: Not on file    Attends meetings of clubs or organizations: Not on file    Relationship  status: Not on file  . Intimate partner violence    Fear of current or ex partner: Not on file    Emotionally abused: Not on file    Physically abused: Not on file    Forced sexual activity: Not on file  Other Topics Concern  . Not on file  Social History Narrative  . Not on file     Constitutional: Fatigue secondary to cough keeping her awake. Denies fever, malaise, headache or abrupt weight changes.  HEENT: Pt reports sinus pressure, post nasal drip. Denies eye pain, eye redness, ear pain, ringing in the ears, wax buildup, runny nose, nasal congestion, bloody nose, or sore throat. Respiratory: Pt reports cough. Denies difficulty breathing, shortness of breath.   Cardiovascular: Denies chest pain, chest tightness, palpitations or swelling in the hands or feet.   No other specific complaints in a complete review of systems (except as listed in HPI above).  Observations/Objective:  Temp 98.6 F (37 C) (Temporal)  Wt Readings from Last 3 Encounters:  08/29/18 140 lb (63.5 kg)  08/15/18 145 lb (65.8 kg)  05/26/18 135 lb (61.2 kg)    General: Appears her stated age, well developed, well nourished in NAD. Pulmonary/Chest: Normal effort. No respiratory distress.   Neurological: Alert and oriented.   BMET    Component Value Date/Time   NA 139 08/29/2018 1543   K 3.2 (L) 08/29/2018 1543   CL 101 08/29/2018 1543   CO2 26 08/29/2018 1543   GLUCOSE 80 08/29/2018 1543   BUN 12 08/29/2018 1543   CREATININE 0.62 08/29/2018 1543   CALCIUM 9.3 08/29/2018 1543   GFRNONAA >60 02/06/2017 0548   GFRAA >60 02/06/2017 0548    Lipid Panel     Component Value Date/Time   CHOL 177 08/29/2018 1543   TRIG 354.0 (H) 08/29/2018 1543   HDL 55.10 08/29/2018 1543   CHOLHDL 3 08/29/2018 1543   VLDL 70.8 (H) 08/29/2018 1543   LDLCALC 75 08/28/2016 1016    CBC    Component Value Date/Time   WBC 11.3 (H) 08/29/2018 1543   RBC 4.28 08/29/2018 1543   HGB 12.6 08/29/2018 1543   HGB 12.3  05/10/2007 1305   HCT 37.9 08/29/2018 1543   HCT 35.8 05/10/2007 1305   PLT 560.0 (H) 08/29/2018 1543   PLT 515 (H) 05/10/2007 1305   MCV 88.7 08/29/2018 1543   MCV 82.8 05/10/2007 1305   MCH 29.5 02/06/2017 0548   MCHC 33.3 08/29/2018 1543   RDW 16.4 (H) 08/29/2018 1543   RDW 15.1 (H) 05/10/2007 1305   LYMPHSABS 3.1 04/13/2017 1449   LYMPHSABS  2.4 05/10/2007 1305   MONOABS 0.6 04/13/2017 1449   MONOABS 0.4 05/10/2007 1305   EOSABS 0.1 04/13/2017 1449   EOSABS 0.1 05/10/2007 1305   BASOSABS 0.1 04/13/2017 1449   BASOSABS 0.0 05/10/2007 1305    Hgb A1C No results found for: HGBA1C   Assessment and Plan:  Viral Sinusitis, Cough:  Continue Xyzal Add in Flonase OTC RX for Tussionex for cough at night- sedation caution given  Return precautions discussed   Follow Up Instructions:    I discussed the assessment and treatment plan with the patient. The patient was provided an opportunity to ask questions and all were answered. The patient agreed with the plan and demonstrated an understanding of the instructions.   The patient was advised to call back or seek an in-person evaluation if the symptoms worsen or if the condition fails to improve as anticipated.     Webb Silversmith, NP

## 2019-04-19 NOTE — Telephone Encounter (Signed)
Pt said starting 04/11/19 pt had sinus pressure and allergy issues; pt took OTC med that helped with sinus pressure; pt has post nasal drip that is causing a prod cough with clear phlegm that is worse at night. Pt has no fever but is requesting refill of tussionex that Avie Echevaria NP gave pt 08/2018. Pt scheduled virtual appt today at 4:30 with R Baity NP; pt does not have way to ck BP but pt will ck temp and wt prior to [facility] call.

## 2019-05-03 ENCOUNTER — Telehealth: Payer: Self-pay | Admitting: Internal Medicine

## 2019-05-03 NOTE — Telephone Encounter (Signed)
It can be, I would prefer in person, but if she is coughing, I guess we can't see her in the office.

## 2019-05-03 NOTE — Telephone Encounter (Signed)
Patient scheduled virtual appointment with [facility] for tomorrow.  Patient thinks she has a pulled muscle In her back from coughing.  Patient said she's still coughing, but not as much and she thinks it's due to allergies. Can a virtual visit be done for a ?pulled muscle?

## 2019-05-04 ENCOUNTER — Ambulatory Visit (INDEPENDENT_AMBULATORY_CARE_PROVIDER_SITE_OTHER): Payer: BC Managed Care – PPO | Admitting: Internal Medicine

## 2019-05-04 ENCOUNTER — Encounter: Payer: Self-pay | Admitting: Internal Medicine

## 2019-05-04 DIAGNOSIS — S29012A Strain of muscle and tendon of back wall of thorax, initial encounter: Secondary | ICD-10-CM | POA: Diagnosis not present

## 2019-05-04 MED ORDER — CYCLOBENZAPRINE HCL 5 MG PO TABS: 5.0000 mg | ORAL_TABLET | Freq: Every evening | ORAL | 0 refills | Status: DC | PRN

## 2019-05-04 NOTE — Progress Notes (Signed)
Virtual Visit via Video Note  I connected with [name] on 05/04/19 at 10:45 AM EDT by a video enabled telemedicine application and verified that I am speaking with the correct person using two identifiers.  Location: Patient: Home Provider: Office   I discussed the limitations of evaluation and management by telemedicine and the availability of in person appointments. The patient expressed understanding and agreed to proceed.  History of Present Illness:  Pt reports muscle pain toward the right side of back that has been going on for a little less than 2 weeks. She describes it as achy in nature that does not radiate. She states that it first started when she had a cough and feels like this may have caused her back pain. She denies numbness, tingling or weakness of her lower extremities. She denies loss of bowel or bladder control. She has tried Naproxen, Ibuprofen, heat and massage with minimal relief. She denies any trauma, falls, fever or chills.    Past Medical History:  Diagnosis Date  . Allergy   . Anxiety   . GERD (gastroesophageal reflux disease)   . Hyperlipidemia   . Hypertension   . PVC (premature ventricular contraction) 07/20/2016  . Sinus tachycardia 07/20/2016    Current Outpatient Medications  Medication Sig Dispense Refill  . amLODipine (NORVASC) 10 MG tablet TAKE 1 TABLET(10 MG) BY MOUTH DAILY 90 tablet 0  . atorvastatin (LIPITOR) 10 MG tablet TAKE 1 TABLET(10 MG) BY MOUTH DAILY AT 6 PM 90 tablet 0  . benzonatate (TESSALON) 200 MG capsule Take 1 capsule (200 mg total) by mouth 2 (two) times daily as needed for cough. 20 capsule 0  . chlorpheniramine-HYDROcodone (TUSSIONEX PENNKINETIC ER) 10-8 MG/5ML SUER Take 5 mLs by mouth at bedtime as needed. 140 mL 0  . cyclobenzaprine (FLEXERIL) 10 MG tablet Take 1 tablet (10 mg total) by mouth 3 (three) times daily. 20 tablet 0  . dextromethorphan-guaiFENesin (MUCINEX DM) 30-600 MG 12hr tablet Take 1 tablet by mouth 2  (two) times daily. 20 tablet 0  . losartan (COZAAR) 25 MG tablet TAKE 1 TABLET(25 MG) BY MOUTH DAILY 90 tablet 2  . omeprazole (PRILOSEC) 20 MG capsule TAKE 1 CAPSULE(20 MG) BY MOUTH DAILY 90 capsule 0  . venlafaxine XR (EFFEXOR-XR) 75 MG 24 hr capsule TAKE 1 CAPSULE(75 MG) BY MOUTH DAILY WITH BREAKFAST 90 capsule 2  . Vitamin D, Ergocalciferol, (DRISDOL) 1.25 MG (50000 UT) CAPS capsule Take 1 capsule (50,000 Units total) by mouth every 7 (seven) days. 12 capsule 0   No current facility-administered medications for this visit.     Allergies  Allergen Reactions  . Lotrel [Amlodipine Besy-Benazepril Hcl] Cough    Family History  Problem Relation Age of Onset  . Hypertension Mother   . Heart murmur Father   . Ovarian cancer Paternal Grandmother   . Lupus Sister   . Colon cancer Neg Hx   . Esophageal cancer Neg Hx   . Pancreatic cancer Neg Hx   . Rectal cancer Neg Hx   . Stomach cancer Neg Hx     Social History   Socioeconomic History  . Marital status: Married    Spouse name: Not on file  . Number of children: Not on file  . Years of education: Not on file  . Highest education level: Not on file  Occupational History  . Not on file  Social Needs  . Financial resource strain: Not on file  . Food insecurity    Worry: Not  on file    Inability: Not on file  . Transportation needs    Medical: Not on file    Non-medical: Not on file  Tobacco Use  . Smoking status: Light Tobacco Smoker    Packs/day: 0.30  . Smokeless tobacco: Never Used  . Tobacco comment: black-n-milds   Substance and Sexual Activity  . Alcohol use: Yes    Comment: occasional   . Drug use: Yes    Types: Marijuana    Comment: occasional   . Sexual activity: Yes    Birth control/protection: Surgical  Lifestyle  . Physical activity    Days per week: Not on file    Minutes per session: Not on file  . Stress: Not on file  Relationships  . Social Musicianconnections    Talks on phone: Not on file    Gets  together: Not on file    Attends religious service: Not on file    Active member of club or organization: Not on file    Attends meetings of clubs or organizations: Not on file    Relationship status: Not on file  . Intimate partner violence    Fear of current or ex partner: Not on file    Emotionally abused: Not on file    Physically abused: Not on file    Forced sexual activity: Not on file  Other Topics Concern  . Not on file  Social History Narrative  . Not on file     Constitutional: Denies fever, malaise, fatigue, headache or abrupt weight changes. Marland Kitchen. Respiratory: Denies difficulty breathing, shortness of breath, cough or sputum production.   Cardiovascular: Denies chest pain, chest tightness, palpitations or swelling in the hands or feet.  Musculoskeletal: Mentions she has pain toward right mid-back. Denies decrease in range of motion, difficulty with gait or joint pain and swelling.    No other specific complaints in a complete review of systems (except as listed in HPI above).  Observations/Objective:   Wt Readings from Last 3 Encounters:  08/29/18 140 lb (63.5 kg)  08/15/18 145 lb (65.8 kg)  05/26/18 135 lb (61.2 kg)    General: Appears her stated age, well developed, well nourished in NAD. Pulmonary/Chest: Normal effort and no respiratory distress.  Musculoskeletal: Patient points to right mid back as sight of her pain.   BMET    Component Value Date/Time   NA 139 08/29/2018 1543   K 3.2 (L) 08/29/2018 1543   CL 101 08/29/2018 1543   CO2 26 08/29/2018 1543   GLUCOSE 80 08/29/2018 1543   BUN 12 08/29/2018 1543   CREATININE 0.62 08/29/2018 1543   CALCIUM 9.3 08/29/2018 1543   GFRNONAA >60 02/06/2017 0548   GFRAA >60 02/06/2017 0548    Lipid Panel     Component Value Date/Time   CHOL 177 08/29/2018 1543   TRIG 354.0 (H) 08/29/2018 1543   HDL 55.10 08/29/2018 1543   CHOLHDL 3 08/29/2018 1543   VLDL 70.8 (H) 08/29/2018 1543   LDLCALC 75 08/28/2016  1016    CBC    Component Value Date/Time   WBC 11.3 (H) 08/29/2018 1543   RBC 4.28 08/29/2018 1543   HGB 12.6 08/29/2018 1543   HGB 12.3 05/10/2007 1305   HCT 37.9 08/29/2018 1543   HCT 35.8 05/10/2007 1305   PLT 560.0 (H) 08/29/2018 1543   PLT 515 (H) 05/10/2007 1305   MCV 88.7 08/29/2018 1543   MCV 82.8 05/10/2007 1305   MCH 29.5 02/06/2017 0548  MCHC 33.3 08/29/2018 1543   RDW 16.4 (H) 08/29/2018 1543   RDW 15.1 (H) 05/10/2007 1305   LYMPHSABS 3.1 04/13/2017 1449   LYMPHSABS 2.4 05/10/2007 1305   MONOABS 0.6 04/13/2017 1449   MONOABS 0.4 05/10/2007 1305   EOSABS 0.1 04/13/2017 1449   EOSABS 0.1 05/10/2007 1305   BASOSABS 0.1 04/13/2017 1449   BASOSABS 0.0 05/10/2007 1305    Hgb A1C No results found for: HGBA1C     Assessment and Plan: Muscle Strain of Right Mid Back:  Flexeril 5 mg QHS PRN  Encouraged stretches, heat and massage Ok to continue Ibuprofen/Naproxen OTC  RTC if symptoms persist or worsen   Follow Up Instructions:    I discussed the assessment and treatment plan with the patient. The patient was provided an opportunity to ask questions and all were answered. The patient agreed with the plan and demonstrated an understanding of the instructions.   The patient was advised to call back or seek an in-person evaluation if the symptoms worsen or if the condition fails to improve as anticipated.     Webb Silversmith, NP

## 2019-05-04 NOTE — Patient Instructions (Signed)

## 2019-05-25 ENCOUNTER — Other Ambulatory Visit: Payer: Self-pay | Admitting: Internal Medicine

## 2019-06-17 ENCOUNTER — Other Ambulatory Visit: Payer: Self-pay | Admitting: Internal Medicine

## 2019-06-18 ENCOUNTER — Other Ambulatory Visit: Payer: Self-pay | Admitting: Internal Medicine

## 2019-08-18 ENCOUNTER — Other Ambulatory Visit: Payer: Self-pay | Admitting: Internal Medicine

## 2019-08-20 ENCOUNTER — Other Ambulatory Visit: Payer: Self-pay | Admitting: Internal Medicine

## 2019-08-22 ENCOUNTER — Ambulatory Visit: Payer: BC Managed Care – PPO | Attending: Internal Medicine

## 2019-08-22 ENCOUNTER — Telehealth: Payer: Self-pay | Admitting: *Deleted

## 2019-08-22 DIAGNOSIS — Z20822 Contact with and (suspected) exposure to covid-19: Secondary | ICD-10-CM

## 2019-08-22 NOTE — Telephone Encounter (Signed)
Agree with advice given

## 2019-08-22 NOTE — Telephone Encounter (Signed)
Patient called stating that her husband tested positive for covid this morning. Patient stated that she has a dull headache, head congestion, and has loss her sense of smell. Patient declined a virtual visit stating that she does not feel that she needs a visit at this time. Patient just wanted to know how to go about getting tested?  Patient was given telephone number to reach out to [facility] for testing. Patient was advised to drink plenty of fluids and rest. ER precautions were given to patient and she verbalized understanding. Advised patient to call back and schedule a virtual visit if she feels that she needs a visit. Patient stated that she and her husband are in quarantine.

## 2019-08-23 LAB — NOVEL CORONAVIRUS, NAA: SARS-CoV-2, NAA: DETECTED — AB

## 2019-08-24 ENCOUNTER — Telehealth: Payer: Self-pay | Admitting: Adult Health

## 2019-08-24 NOTE — Telephone Encounter (Signed)
I called [name] to determine her elgibility to receive monoclonal antibody therapy for her recent COVID 19 positivity.  Date Tested: 08/22/2019  Date of Symptom onset: 08/22/2019   Symptoms: loss of taste and smell, mild body aches.   After reviewing the criteria, [name] does not meet any high risk categories to receive the infusion.  She verbalized understanding and was appreciative of the call.  I wished her a speedy recovery and she knows to f/u with PCP/urgent care for any further needs.     Lillard Anes, NP

## 2019-08-25 ENCOUNTER — Other Ambulatory Visit: Payer: Self-pay

## 2019-08-25 ENCOUNTER — Encounter: Payer: Self-pay | Admitting: Internal Medicine

## 2019-08-25 ENCOUNTER — Ambulatory Visit (INDEPENDENT_AMBULATORY_CARE_PROVIDER_SITE_OTHER): Payer: BC Managed Care – PPO | Admitting: Internal Medicine

## 2019-08-25 VITALS — BP 125/91 | Temp 100.3°F

## 2019-08-25 DIAGNOSIS — U071 COVID-19: Secondary | ICD-10-CM

## 2019-08-25 MED ORDER — HYDROCODONE-HOMATROPINE 5-1.5 MG/5ML PO SYRP: 5.0000 mL | ORAL_SOLUTION | Freq: Three times a day (TID) | ORAL | 0 refills | Status: DC | PRN

## 2019-08-25 NOTE — Progress Notes (Signed)
Virtual Visit via Video Note  I connected with [name] on 08/25/19 at  3:15 PM EST by a video enabled telemedicine application and verified that I am speaking with the correct person using two identifiers.  Location: Patient: Home Provider: Office   I discussed the limitations of evaluation and management by telemedicine and the availability of in person appointments. The patient expressed understanding and agreed to proceed.  History of Present Illness:  Pt reports diagnosis of COVID 19 on 08/22/19. She reports fatigue, headache, red eyes, ear fullness, loss of taste and smell, cough, weakness and body aches. The headache is located in the back of her head. She describes the pain as throbbing pressure. She reports associated lightheadedness. She denies vision changes, sensitivity to light, sound, nausea or vomiting. The cough is productive of clear mucous. She denies runny nose, nasal congestion, ear pain, sore throat or SOB. She denies fever. She has had some loose stools. She has tried Corcidin with minimal relief. She is not a candidate for monoclonal antibody infusion.    Past Medical History:  Diagnosis Date  . Allergy   . Anxiety   . GERD (gastroesophageal reflux disease)   . Hyperlipidemia   . Hypertension   . PVC (premature ventricular contraction) 07/20/2016  . Sinus tachycardia 07/20/2016    Current Outpatient Medications  Medication Sig Dispense Refill  . amLODipine (NORVASC) 10 MG tablet TAKE 1 TABLET(10 MG) BY MOUTH DAILY 90 tablet 0  . atorvastatin (LIPITOR) 10 MG tablet TAKE 1 TABLET(10 MG) BY MOUTH DAILY AT 6 PM 90 tablet 0  . chlorpheniramine-HYDROcodone (TUSSIONEX PENNKINETIC ER) 10-8 MG/5ML SUER Take 5 mLs by mouth at bedtime as needed. 140 mL 0  . cyclobenzaprine (FLEXERIL) 5 MG tablet Take 1 tablet (5 mg total) by mouth at bedtime as needed for muscle spasms. 20 tablet 0  . dextromethorphan-guaiFENesin (MUCINEX DM) 30-600 MG 12hr tablet Take 1 tablet by  mouth 2 (two) times daily. 20 tablet 0  . losartan (COZAAR) 25 MG tablet TAKE 1 TABLET(25 MG) BY MOUTH DAILY 90 tablet 0  . omeprazole (PRILOSEC) 20 MG capsule TAKE 1 CAPSULE(20 MG) BY MOUTH DAILY 90 capsule 0  . venlafaxine XR (EFFEXOR-XR) 75 MG 24 hr capsule TAKE 1 CAPSULE(75 MG) BY MOUTH DAILY WITH BREAKFAST 90 capsule 0  . Vitamin D, Ergocalciferol, (DRISDOL) 1.25 MG (50000 UT) CAPS capsule Take 1 capsule (50,000 Units total) by mouth every 7 (seven) days. 12 capsule 0   No current facility-administered medications for this visit.    Allergies  Allergen Reactions  . Benazepril Cough    Family History  Problem Relation Age of Onset  . Hypertension Mother   . Heart murmur Father   . Ovarian cancer Paternal Grandmother   . Lupus Sister   . Colon cancer Neg Hx   . Esophageal cancer Neg Hx   . Pancreatic cancer Neg Hx   . Rectal cancer Neg Hx   . Stomach cancer Neg Hx     Social History   Socioeconomic History  . Marital status: Married    Spouse name: Not on file  . Number of children: Not on file  . Years of education: Not on file  . Highest education level: Not on file  Occupational History  . Not on file  Tobacco Use  . Smoking status: Light Tobacco Smoker    Packs/day: 0.30  . Smokeless tobacco: Never Used  . Tobacco comment: black-n-milds   Substance and Sexual Activity  . Alcohol  use: Yes    Comment: occasional   . Drug use: Yes    Types: Marijuana    Comment: occasional   . Sexual activity: Yes    Birth control/protection: Surgical  Other Topics Concern  . Not on file  Social History Narrative  . Not on file   Social Determinants of Health   Financial Resource Strain:   . Difficulty of Paying Living Expenses: Not on file  Food Insecurity:   . Worried About Programme researcher, broadcasting/film/video in the Last Year: Not on file  . Ran Out of Food in the Last Year: Not on file  Transportation Needs:   . Lack of Transportation (Medical): Not on file  . Lack of  Transportation (Non-Medical): Not on file  Physical Activity:   . Days of Exercise per Week: Not on file  . Minutes of Exercise per Session: Not on file  Stress:   . Feeling of Stress : Not on file  Social Connections:   . Frequency of Communication with Friends and Family: Not on file  . Frequency of Social Gatherings with Friends and Family: Not on file  . Attends Religious Services: Not on file  . Active Member of Clubs or Organizations: Not on file  . Attends Banker Meetings: Not on file  . Marital Status: Not on file  Intimate Partner Violence:   . Fear of Current or Ex-Partner: Not on file  . Emotionally Abused: Not on file  . Physically Abused: Not on file  . Sexually Abused: Not on file     Constitutional: Pt reports fatigue, headache. Denies fever, malaise, or abrupt weight changes.  HEENT: Pt reports red eyes, loss of taste/smell. Denies eye pain, ear pain, ringing in the ears, wax buildup, runny nose, nasal congestion, bloody nose, or sore throat. Respiratory: Pt reports cough. Denies difficulty breathing, shortness of breath.   Cardiovascular: Denies chest pain, chest tightness, palpitations or swelling in the hands or feet.  Gastrointestinal: Pt reports diarrhea yesterday. Denies abdominal pain, bloating, constipation,  or blood in the stool.  Skin: Denies redness, rashes, lesions or ulcercations.    No other specific complaints in a complete review of systems (except as listed in HPI above).   Observations/Objective:   Wt Readings from Last 3 Encounters:  08/29/18 140 lb (63.5 kg)  08/15/18 145 lb (65.8 kg)  05/26/18 135 lb (61.2 kg)    General: Appears her stated age, unwell but in NAD. HEENT: Head: normal shape and size;  Nose: no congestion noted; Throat/Mouth: some hoarseness noted Pulmonary/Chest: Normal effort. No respiratory distress.   Neurological: Alert and oriented.   BMET    Component Value Date/Time   NA 139 08/29/2018 1543   K  3.2 (L) 08/29/2018 1543   CL 101 08/29/2018 1543   CO2 26 08/29/2018 1543   GLUCOSE 80 08/29/2018 1543   BUN 12 08/29/2018 1543   CREATININE 0.62 08/29/2018 1543   CALCIUM 9.3 08/29/2018 1543   GFRNONAA >60 02/06/2017 0548   GFRAA >60 02/06/2017 0548    Lipid Panel     Component Value Date/Time   CHOL 177 08/29/2018 1543   TRIG 354.0 (H) 08/29/2018 1543   HDL 55.10 08/29/2018 1543   CHOLHDL 3 08/29/2018 1543   VLDL 70.8 (H) 08/29/2018 1543   LDLCALC 75 08/28/2016 1016    CBC    Component Value Date/Time   WBC 11.3 (H) 08/29/2018 1543   RBC 4.28 08/29/2018 1543   HGB 12.6 08/29/2018 1543  HGB 12.3 05/10/2007 1305   HCT 37.9 08/29/2018 1543   HCT 35.8 05/10/2007 1305   PLT 560.0 (H) 08/29/2018 1543   PLT 515 (H) 05/10/2007 1305   MCV 88.7 08/29/2018 1543   MCV 82.8 05/10/2007 1305   MCH 29.5 02/06/2017 0548   MCHC 33.3 08/29/2018 1543   RDW 16.4 (H) 08/29/2018 1543   RDW 15.1 (H) 05/10/2007 1305   LYMPHSABS 3.1 04/13/2017 1449   LYMPHSABS 2.4 05/10/2007 1305   MONOABS 0.6 04/13/2017 1449   MONOABS 0.4 05/10/2007 1305   EOSABS 0.1 04/13/2017 1449   EOSABS 0.1 05/10/2007 1305   BASOSABS 0.1 04/13/2017 1449   BASOSABS 0.0 05/10/2007 1305    Hgb A1C No results found for: HGBA1C     Assessment and Plan:  Covid 19:  She will quarantine x 10 days Encouraged masking, frequent handwashing and social distancing even in the home Advised her to take Ibuprofen/Tylenol for headache and body aches Get rest and drink plenty of fluids RX for Hycodan for cough Advised her to start Vit C and Zinc OTC  UC/ER precautions discussed  Follow Up Instructions:    I discussed the assessment and treatment plan with the patient. The patient was provided an opportunity to ask questions and all were answered. The patient agreed with the plan and demonstrated an understanding of the instructions.   The patient was advised to call back or seek an in-person evaluation if the  symptoms worsen or if the condition fails to improve as anticipated.   Nicki Reaper, NP

## 2019-08-25 NOTE — Patient Instructions (Signed)
COVID-19 COVID-19 is a respiratory infection that is caused by a virus called severe acute respiratory syndrome coronavirus 2 (SARS-CoV-2). The disease is also known as coronavirus disease or novel coronavirus. In some people, the virus may not cause any symptoms. In others, it may cause a serious infection. The infection can get worse quickly and can lead to complications, such as:  Pneumonia, or infection of the lungs.  Acute respiratory distress syndrome or ARDS. This is a condition in which fluid build-up in the lungs prevents the lungs from filling with air and passing oxygen into the blood.  Acute respiratory failure. This is a condition in which there is not enough oxygen passing from the lungs to the body or when carbon dioxide is not passing from the lungs out of the body.  Sepsis or septic shock. This is a serious bodily reaction to an infection.  Blood clotting problems.  Secondary infections due to bacteria or fungus.  Organ failure. This is when your body's organs stop working. The virus that causes COVID-19 is contagious. This means that it can spread from person to person through droplets from coughs and sneezes (respiratory secretions). What are the causes? This illness is caused by a virus. You may catch the virus by:  Breathing in droplets from an infected person. Droplets can be spread by a person breathing, speaking, singing, coughing, or sneezing.  Touching something, like a table or a doorknob, that was exposed to the virus (contaminated) and then touching your mouth, nose, or eyes. What increases the risk? Risk for infection You are more likely to be infected with this virus if you:  Are within 6 feet (2 meters) of a person with COVID-19.  Provide care for or live with a person who is infected with COVID-19.  Spend time in crowded indoor spaces or live in shared housing. Risk for serious illness You are more likely to become seriously ill from the virus if you:   Are 50 years of age or older. The higher your age, the more you are at risk for serious illness.  Live in a nursing home or long-term care facility.  Have cancer.  Have a long-term (chronic) disease such as: ? Chronic lung disease, including chronic obstructive pulmonary disease or asthma. ? A long-term disease that lowers your body's ability to fight infection (immunocompromised). ? Heart disease, including heart failure, a condition in which the arteries that lead to the heart become narrow or blocked (coronary artery disease), a disease which makes the heart muscle thick, weak, or stiff (cardiomyopathy). ? Diabetes. ? Chronic kidney disease. ? Sickle cell disease, a condition in which red blood cells have an abnormal "sickle" shape. ? Liver disease.  Are obese. What are the signs or symptoms? Symptoms of this condition can range from mild to severe. Symptoms may appear any time from 2 to 14 days after being exposed to the virus. They include:  A fever or chills.  A cough.  Difficulty breathing.  Headaches, body aches, or muscle aches.  Runny or stuffy (congested) nose.  A sore throat.  New loss of taste or smell. Some people may also have stomach problems, such as nausea, vomiting, or diarrhea. Other people may not have any symptoms of COVID-19. How is this diagnosed? This condition may be diagnosed based on:  Your signs and symptoms, especially if: ? You live in an area with a COVID-19 outbreak. ? You recently traveled to or from an area where the virus is common. ? You   provide care for or live with a person who was diagnosed with COVID-19. ? You were exposed to a person who was diagnosed with COVID-19.  A physical exam.  Lab tests, which may include: ? Taking a sample of fluid from the back of your nose and throat (nasopharyngeal fluid), your nose, or your throat using a swab. ? A sample of mucus from your lungs (sputum). ? Blood tests.  Imaging tests, which  may include, X-rays, CT scan, or ultrasound. How is this treated? At present, there is no medicine to treat COVID-19. Medicines that treat other diseases are being used on a trial basis to see if they are effective against COVID-19. Your health care provider will talk with you about ways to treat your symptoms. For most people, the infection is mild and can be managed at home with rest, fluids, and over-the-counter medicines. Treatment for a serious infection usually takes places in a hospital intensive care unit (ICU). It may include one or more of the following treatments. These treatments are given until your symptoms improve.  Receiving fluids and medicines through an IV.  Supplemental oxygen. Extra oxygen is given through a tube in the nose, a face mask, or a hood.  Positioning you to lie on your stomach (prone position). This makes it easier for oxygen to get into the lungs.  Continuous positive airway pressure (CPAP) or bi-level positive airway pressure (BPAP) machine. This treatment uses mild air pressure to keep the airways open. A tube that is connected to a motor delivers oxygen to the body.  Ventilator. This treatment moves air into and out of the lungs by using a tube that is placed in your windpipe.  Tracheostomy. This is a procedure to create a hole in the neck so that a breathing tube can be inserted.  Extracorporeal membrane oxygenation (ECMO). This procedure gives the lungs a chance to recover by taking over the functions of the heart and lungs. It supplies oxygen to the body and removes carbon dioxide. Follow these instructions at home: Lifestyle  If you are sick, stay home except to get medical care. Your health care provider will tell you how long to stay home. Call your health care provider before you go for medical care.  Rest at home as told by your health care provider.  Do not use any products that contain nicotine or tobacco, such as cigarettes, e-cigarettes, and  chewing tobacco. If you need help quitting, ask your health care provider.  Return to your normal activities as told by your health care provider. Ask your health care provider what activities are safe for you. General instructions  Take over-the-counter and prescription medicines only as told by your health care provider.  Drink enough fluid to keep your urine pale yellow.  Keep all follow-up visits as told by your health care provider. This is important. How is this prevented?  There is no vaccine to help prevent COVID-19 infection. However, there are steps you can take to protect yourself and others from this virus. To protect yourself:   Do not travel to areas where COVID-19 is a risk. The areas where COVID-19 is reported change often. To identify high-risk areas and travel restrictions, check the CDC travel website: wwwnc.cdc.gov/travel/notices  If you live in, or must travel to, an area where COVID-19 is a risk, take precautions to avoid infection. ? Stay away from people who are sick. ? Wash your hands often with soap and water for 20 seconds. If soap and water   are not available, use an alcohol-based hand sanitizer. ? Avoid touching your mouth, face, eyes, or nose. ? Avoid going out in public, follow guidance from your state and local health authorities. ? If you must go out in public, wear a cloth face covering or face mask. Make sure your mask covers your nose and mouth. ? Avoid crowded indoor spaces. Stay at least 6 feet (2 meters) away from others. ? Disinfect objects and surfaces that are frequently touched every day. This may include:  Counters and tables.  Doorknobs and light switches.  Sinks and faucets.  Electronics, such as phones, remote controls, keyboards, computers, and tablets. To protect others: If you have symptoms of COVID-19, take steps to prevent the virus from spreading to others.  If you think you have a COVID-19 infection, contact your health care  provider right away. Tell your health care team that you think you may have a COVID-19 infection.  Stay home. Leave your house only to seek medical care. Do not use public transport.  Do not travel while you are sick.  Wash your hands often with soap and water for 20 seconds. If soap and water are not available, use alcohol-based hand sanitizer.  Stay away from other members of your household. Let healthy household members care for children and pets, if possible. If you have to care for children or pets, wash your hands often and wear a mask. If possible, stay in your own room, separate from others. Use a different bathroom.  Make sure that all people in your household wash their hands well and often.  Cough or sneeze into a tissue or your sleeve or elbow. Do not cough or sneeze into your hand or into the air.  Wear a cloth face covering or face mask. Make sure your mask covers your nose and mouth. Where to find more information  Centers for Disease Control and Prevention: www.cdc.gov/coronavirus/2019-ncov/index.html  World Health Organization: www.who.int/health-topics/coronavirus Contact a health care provider if:  You live in or have traveled to an area where COVID-19 is a risk and you have symptoms of the infection.  You have had contact with someone who has COVID-19 and you have symptoms of the infection. Get help right away if:  You have trouble breathing.  You have pain or pressure in your chest.  You have confusion.  You have bluish lips and fingernails.  You have difficulty waking from sleep.  You have symptoms that get worse. These symptoms may represent a serious problem that is an emergency. Do not wait to see if the symptoms will go away. Get medical help right away. Call your local emergency services (911 in the [place] Do not drive yourself to the hospital. Let the emergency medical personnel know if you think you have COVID-19. Summary  COVID-19 is a  respiratory infection that is caused by a virus. It is also known as coronavirus disease or novel coronavirus. It can cause serious infections, such as pneumonia, acute respiratory distress syndrome, acute respiratory failure, or sepsis.  The virus that causes COVID-19 is contagious. This means that it can spread from person to person through droplets from breathing, speaking, singing, coughing, or sneezing.  You are more likely to develop a serious illness if you are 50 years of age or older, have a weak immune system, live in a nursing home, or have chronic disease.  There is no medicine to treat COVID-19. Your health care provider will talk with you about ways to treat your symptoms.    Take steps to protect yourself and others from infection. Wash your hands often and disinfect objects and surfaces that are frequently touched every day. Stay away from people who are sick and wear a mask if you are sick. This information is not intended to replace advice given to you by your health care provider. Make sure you discuss any questions you have with your health care provider. Document Revised: 04/21/2019 Document Reviewed: 07/28/2018 Elsevier Patient Education  2020 [employer]  

## 2019-08-25 NOTE — Telephone Encounter (Addendum)
Pt left v/m; had recent + covid and pt has been talking with school nurse and wants to know if there is any med pt can take. I spoke with pt; pt notified on 08/23/19 had + covid; pt is quarantining,pt is drinking plenty of fluids, pt is trying to rest; pt has been taking coricidin but that has not helped cough. Pt has not taken tylenol. Pt said her body is very achy; T 99.3 and pt having chills, weakness, and H/A, pt has prod cough with clear phlegm and having more episodes today of cough than previously; no SOB, no abd pain and no rash. Pt had diarrhea on 08/24/19. Pt has had loss of taste and smell. Eyes are red looking. No S/T or runny nose, no bruising, bleeding or vomiting. UC & ED precautions given and pt voiced understanding. Pt has virtual appt today at 3:15 with Pamala Hurry NP. Please see phone note from oncology on 08/23/20 that pt does not qualify for monoclonal antibody therapy infusion. FYI to Pamala Hurry NP.

## 2019-09-18 ENCOUNTER — Telehealth: Payer: Self-pay

## 2019-09-18 NOTE — Telephone Encounter (Signed)
Pt was diagnosed with + covid on 08/23/19 and pt returned to work on 09/11/19. pts employer needs copy of + covid lab result. Pt requested + covid lab result mailed to pts confirmed home address under demographics. Done.

## 2019-09-22 ENCOUNTER — Other Ambulatory Visit: Payer: Self-pay | Admitting: Internal Medicine

## 2019-09-25 ENCOUNTER — Telehealth: Payer: Self-pay

## 2019-09-25 NOTE — Telephone Encounter (Signed)
Ok for work note, I saw her during that time

## 2019-09-25 NOTE — Telephone Encounter (Signed)
Pt left v/m that pt had + covid test result on 08/23/19 and  requesting a note for being out of work from 08/22/19 - 09/11/19.Please advise.

## 2019-09-26 NOTE — Telephone Encounter (Signed)
Letter released via mychart pt is aware

## 2019-11-09 ENCOUNTER — Encounter: Payer: BC Managed Care – PPO | Admitting: Internal Medicine

## 2019-11-09 DIAGNOSIS — Z0289 Encounter for other administrative examinations: Secondary | ICD-10-CM

## 2019-11-09 NOTE — Progress Notes (Deleted)
Subjective:    Patient ID: [name], female    DOB: 10-29-1970, 49 y.o.   MRN: [id number]  HPI  Pt presents to the clinic today for her annual exam. She is also due to follow up chronic conditions.  HTN: Her BP today is. She is taking Losartan and Amlodipine as prescribed. ECG from 02/2017 reviewed.  HLD: Her last LDL was 84, 08/2018. She is not taking any cholesterol lowering medication. She tries to consume a low fat diet.  GERD: She denies breakthrough on Omeprazole. Upper GI from 12/2007 reviewed.  Menopausal Symptoms: Mainly hot flashes, fatigue, and difficulty with memory. Managed on Venlafaxine.  Flu: 08/2018 Tetanus: 08/2018 Covid: Pap Smear: Mammogram: Colon Screening: Vision Screening: Dentist:  Diet: Exercise:  Review of Systems      Past Medical History:  Diagnosis Date  . Allergy   . Anxiety   . GERD (gastroesophageal reflux disease)   . Hyperlipidemia   . Hypertension   . PVC (premature ventricular contraction) 07/20/2016  . Sinus tachycardia 07/20/2016    Current Outpatient Medications  Medication Sig Dispense Refill  . amLODipine (NORVASC) 10 MG tablet TAKE 1 TABLET(10 MG) BY MOUTH DAILY 90 tablet 0  . atorvastatin (LIPITOR) 10 MG tablet Take 1 tablet (10 mg total) by mouth daily at 6 PM. MUST SCHEDULE PHYSICAL 90 tablet 0  . cyclobenzaprine (FLEXERIL) 5 MG tablet Take 1 tablet (5 mg total) by mouth at bedtime as needed for muscle spasms. 20 tablet 0  . HYDROcodone-homatropine (HYCODAN) 5-1.5 MG/5ML syrup Take 5 mLs by mouth every 8 (eight) hours as needed for cough. 120 mL 0  . losartan (COZAAR) 25 MG tablet TAKE 1 TABLET(25 MG) BY MOUTH DAILY 90 tablet 0  . omeprazole (PRILOSEC) 20 MG capsule Take 1 capsule (20 mg total) by mouth daily. MUST SCHEDULE PHYSICAL 90 capsule 0  . venlafaxine XR (EFFEXOR-XR) 75 MG 24 hr capsule Take 1 capsule (75 mg total) by mouth daily with breakfast. MUST SCHEDULE PHYSICAL 90 capsule 0   No current  facility-administered medications for this visit.    Allergies  Allergen Reactions  . Benazepril Cough    Family History  Problem Relation Age of Onset  . Hypertension Mother   . Heart murmur Father   . Ovarian cancer Paternal Grandmother   . Lupus Sister   . Colon cancer Neg Hx   . Esophageal cancer Neg Hx   . Pancreatic cancer Neg Hx   . Rectal cancer Neg Hx   . Stomach cancer Neg Hx     Social History   Socioeconomic History  . Marital status: Married    Spouse name: Not on file  . Number of children: Not on file  . Years of education: Not on file  . Highest education level: Not on file  Occupational History  . Not on file  Tobacco Use  . Smoking status: Light Tobacco Smoker    Packs/day: 0.30  . Smokeless tobacco: Never Used  . Tobacco comment: black-n-milds   Substance and Sexual Activity  . Alcohol use: Yes    Comment: occasional   . Drug use: Yes    Types: Marijuana    Comment: occasional   . Sexual activity: Yes    Birth control/protection: Surgical  Other Topics Concern  . Not on file  Social History Narrative  . Not on file   Social Determinants of Health   Financial Resource Strain:   . Difficulty of Paying Living Expenses:   Food  Insecurity:   . Worried About Programme researcher, broadcasting/film/video in the Last Year:   . Barista in the Last Year:   Transportation Needs:   . Freight forwarder (Medical):   Marland Kitchen Lack of Transportation (Non-Medical):   Physical Activity:   . Days of Exercise per Week:   . Minutes of Exercise per Session:   Stress:   . Feeling of Stress :   Social Connections:   . Frequency of Communication with Friends and Family:   . Frequency of Social Gatherings with Friends and Family:   . Attends Religious Services:   . Active Member of Clubs or Organizations:   . Attends Banker Meetings:   Marland Kitchen Marital Status:   Intimate Partner Violence:   . Fear of Current or Ex-Partner:   . Emotionally Abused:   Marland Kitchen Physically  Abused:   . Sexually Abused:      Constitutional: Denies fever, malaise, fatigue, headache or abrupt weight changes.  HEENT: Denies eye pain, eye redness, ear pain, ringing in the ears, wax buildup, runny nose, nasal congestion, bloody nose, or sore throat. Respiratory: Denies difficulty breathing, shortness of breath, cough or sputum production.   Cardiovascular: Denies chest pain, chest tightness, palpitations or swelling in the hands or feet.  Gastrointestinal: Denies abdominal pain, bloating, constipation, diarrhea or blood in the stool.  GU: Denies urgency, frequency, pain with urination, burning sensation, blood in urine, odor or discharge. Musculoskeletal: Denies decrease in range of motion, difficulty with gait, muscle pain or joint pain and swelling.  Skin: Denies redness, rashes, lesions or ulcercations.  Neurological: Denies dizziness, difficulty with memory, difficulty with speech or problems with balance and coordination.  Psych: Denies anxiety, depression, SI/HI.  No other specific complaints in a complete review of systems (except as listed in HPI above).  Objective:   Physical Exam  There were no vitals taken for this visit. Wt Readings from Last 3 Encounters:  08/29/18 140 lb (63.5 kg)  08/15/18 145 lb (65.8 kg)  05/26/18 135 lb (61.2 kg)    General: Appears their stated age, well developed, well nourished in NAD. Skin: Warm, dry and intact. No rashes, lesions or ulcerations noted. HEENT: Head: normal shape and size; Eyes: sclera white, no icterus, conjunctiva pink, PERRLA and EOMs intact; Ears: Tm's gray and intact, normal light reflex; Nose: mucosa pink and moist, septum midline; Throat/Mouth: Teeth present, mucosa pink and moist, no exudate, lesions or ulcerations noted.  Neck:  Neck supple, trachea midline. No masses, lumps or thyromegaly present.  Cardiovascular: Normal rate and rhythm. S1,S2 noted.  No murmur, rubs or gallops noted. No JVD or BLE edema. No  carotid bruits noted. Pulmonary/Chest: Normal effort and positive vesicular breath sounds. No respiratory distress. No wheezes, rales or ronchi noted.  Abdomen: Soft and nontender. Normal bowel sounds. No distention or masses noted. Liver, spleen and kidneys non palpable. Musculoskeletal: Normal range of motion. No signs of joint swelling. No difficulty with gait.  Neurological: Alert and oriented. Cranial nerves II-XII grossly intact. Coordination normal.  Psychiatric: Mood and affect normal. Behavior is normal. Judgment and thought content normal.   EKG:  BMET    Component Value Date/Time   NA 139 08/29/2018 1543   K 3.2 (L) 08/29/2018 1543   CL 101 08/29/2018 1543   CO2 26 08/29/2018 1543   GLUCOSE 80 08/29/2018 1543   BUN 12 08/29/2018 1543   CREATININE 0.62 08/29/2018 1543   CALCIUM 9.3 08/29/2018 1543   GFRNONAA >  60 02/06/2017 0548   GFRAA >60 02/06/2017 0548    Lipid Panel     Component Value Date/Time   CHOL 177 08/29/2018 1543   TRIG 354.0 (H) 08/29/2018 1543   HDL 55.10 08/29/2018 1543   CHOLHDL 3 08/29/2018 1543   VLDL 70.8 (H) 08/29/2018 1543   LDLCALC 75 08/28/2016 1016    CBC    Component Value Date/Time   WBC 11.3 (H) 08/29/2018 1543   RBC 4.28 08/29/2018 1543   HGB 12.6 08/29/2018 1543   HGB 12.3 05/10/2007 1305   HCT 37.9 08/29/2018 1543   HCT 35.8 05/10/2007 1305   PLT 560.0 (H) 08/29/2018 1543   PLT 515 (H) 05/10/2007 1305   MCV 88.7 08/29/2018 1543   MCV 82.8 05/10/2007 1305   MCH 29.5 02/06/2017 0548   MCHC 33.3 08/29/2018 1543   RDW 16.4 (H) 08/29/2018 1543   RDW 15.1 (H) 05/10/2007 1305   LYMPHSABS 3.1 04/13/2017 1449   LYMPHSABS 2.4 05/10/2007 1305   MONOABS 0.6 04/13/2017 1449   MONOABS 0.4 05/10/2007 1305   EOSABS 0.1 04/13/2017 1449   EOSABS 0.1 05/10/2007 1305   BASOSABS 0.1 04/13/2017 1449   BASOSABS 0.0 05/10/2007 1305    Hgb A1C No results found for: HGBA1C          Assessment & Plan:   Preventative Health  Maintenance:  Encouraged her to get a flu shot in the fall Tetanus UTD Pap smear Mammogram Colon screening Encouraged her to consume a balanced diet and exercise regimen Advised her to see an eye doctor and dentist annually Will check CBC, CMET, Lipid and Vit D today  RTC in 1 year, sooner if needed Nicki Reaper, NP This visit occurred during the SARS-CoV-2 public health emergency.  Safety protocols were in place, including screening questions prior to the visit, additional usage of staff PPE, and extensive cleaning of exam room while observing appropriate contact time as indicated for disinfecting solutions.

## 2019-11-19 ENCOUNTER — Other Ambulatory Visit: Payer: Self-pay | Admitting: Internal Medicine

## 2019-11-24 ENCOUNTER — Encounter: Payer: Self-pay | Admitting: Internal Medicine

## 2019-11-24 ENCOUNTER — Telehealth: Payer: Self-pay

## 2019-11-24 ENCOUNTER — Telehealth (INDEPENDENT_AMBULATORY_CARE_PROVIDER_SITE_OTHER): Payer: BC Managed Care – PPO | Admitting: Internal Medicine

## 2019-11-24 ENCOUNTER — Other Ambulatory Visit: Payer: Self-pay

## 2019-11-24 DIAGNOSIS — F40243 Fear of flying: Secondary | ICD-10-CM

## 2019-11-24 MED ORDER — ALPRAZOLAM 0.5 MG PO TABS: 0.2500 mg | ORAL_TABLET | Freq: Every evening | ORAL | 0 refills | Status: DC | PRN

## 2019-11-24 NOTE — Progress Notes (Signed)
Virtual Visit via Video Note  I connected with [name] on 11/24/19 at  2:15 PM EDT by a video enabled telemedicine application and verified that I am speaking with the correct person using two identifiers.  Location: Patient: Work Provider: Office   I discussed the limitations of evaluation and management by telemedicine and the availability of in person appointments. The patient expressed understanding and agreed to proceed.  History of Present Illness:  Pt requesting medication for flight anxiety. She has 2 trips planned, 1 this month, the other in June. She leaves this Sunday for the first trip. She has taken Xanax in the past prior to flying and wondering if she can get a RX for this.    Past Medical History:  Diagnosis Date  . Allergy   . Anxiety   . GERD (gastroesophageal reflux disease)   . Hyperlipidemia   . Hypertension   . PVC (premature ventricular contraction) 07/20/2016  . Sinus tachycardia 07/20/2016    Current Outpatient Medications  Medication Sig Dispense Refill  . amLODipine (NORVASC) 10 MG tablet TAKE 1 TABLET(10 MG) BY MOUTH DAILY 90 tablet 0  . atorvastatin (LIPITOR) 10 MG tablet Take 1 tablet (10 mg total) by mouth daily at 6 PM. MUST SCHEDULE PHYSICAL 90 tablet 0  . cyclobenzaprine (FLEXERIL) 5 MG tablet Take 1 tablet (5 mg total) by mouth at bedtime as needed for muscle spasms. 20 tablet 0  . HYDROcodone-homatropine (HYCODAN) 5-1.5 MG/5ML syrup Take 5 mLs by mouth every 8 (eight) hours as needed for cough. 120 mL 0  . losartan (COZAAR) 25 MG tablet TAKE 1 TABLET(25 MG) BY MOUTH DAILY 90 tablet 0  . omeprazole (PRILOSEC) 20 MG capsule Take 1 capsule (20 mg total) by mouth daily. MUST SCHEDULE PHYSICAL 90 capsule 0  . venlafaxine XR (EFFEXOR-XR) 75 MG 24 hr capsule Take 1 capsule (75 mg total) by mouth daily with breakfast. MUST SCHEDULE PHYSICAL 90 capsule 0   No current facility-administered medications for this visit.    Allergies  Allergen  Reactions  . Benazepril Cough    Family History  Problem Relation Age of Onset  . Hypertension Mother   . Heart murmur Father   . Ovarian cancer Paternal Grandmother   . Lupus Sister   . Colon cancer Neg Hx   . Esophageal cancer Neg Hx   . Pancreatic cancer Neg Hx   . Rectal cancer Neg Hx   . Stomach cancer Neg Hx     Social History   Socioeconomic History  . Marital status: Married    Spouse name: Not on file  . Number of children: Not on file  . Years of education: Not on file  . Highest education level: Not on file  Occupational History  . Not on file  Tobacco Use  . Smoking status: Light Tobacco Smoker    Packs/day: 0.30  . Smokeless tobacco: Never Used  . Tobacco comment: black-n-milds   Substance and Sexual Activity  . Alcohol use: Yes    Comment: occasional   . Drug use: Yes    Types: Marijuana    Comment: occasional   . Sexual activity: Yes    Birth control/protection: Surgical  Other Topics Concern  . Not on file  Social History Narrative  . Not on file   Social Determinants of Health   Financial Resource Strain:   . Difficulty of Paying Living Expenses:   Food Insecurity:   . Worried About Charity fundraiser in the  Last Year:   . Ran Out of Food in the Last Year:   Transportation Needs:   . Freight forwarder (Medical):   Marland Kitchen Lack of Transportation (Non-Medical):   Physical Activity:   . Days of Exercise per Week:   . Minutes of Exercise per Session:   Stress:   . Feeling of Stress :   Social Connections:   . Frequency of Communication with Friends and Family:   . Frequency of Social Gatherings with Friends and Family:   . Attends Religious Services:   . Active Member of Clubs or Organizations:   . Attends Banker Meetings:   Marland Kitchen Marital Status:   Intimate Partner Violence:   . Fear of Current or Ex-Partner:   . Emotionally Abused:   Marland Kitchen Physically Abused:   . Sexually Abused:      Constitutional: Denies fever, malaise,  fatigue, headache or abrupt weight changes.  Respiratory: Denies difficulty breathing, shortness of breath, cough or sputum production.   Cardiovascular: Denies chest pain, chest tightness, palpitations or swelling in the hands or feet.  Neurological: Denies dizziness, difficulty with memory, difficulty with speech or problems with balance and coordination.  Psych: Pt reports flight anxiety. Denies depression, SI/HI.  No other specific complaints in a complete review of systems (except as listed in HPI above).  Observations/Objective:  Wt Readings from Last 3 Encounters:  08/29/18 140 lb (63.5 kg)  08/15/18 145 lb (65.8 kg)  05/26/18 135 lb (61.2 kg)    General: Appears her stated age, well developed, well nourished in NAD. Pulmonary/Chest: Normal effort. Psychiatric: Mood and affect normal. Behavior is normal. Judgment and thought content normal.     BMET    Component Value Date/Time   NA 139 08/29/2018 1543   K 3.2 (L) 08/29/2018 1543   CL 101 08/29/2018 1543   CO2 26 08/29/2018 1543   GLUCOSE 80 08/29/2018 1543   BUN 12 08/29/2018 1543   CREATININE 0.62 08/29/2018 1543   CALCIUM 9.3 08/29/2018 1543   GFRNONAA >60 02/06/2017 0548   GFRAA >60 02/06/2017 0548    Lipid Panel     Component Value Date/Time   CHOL 177 08/29/2018 1543   TRIG 354.0 (H) 08/29/2018 1543   HDL 55.10 08/29/2018 1543   CHOLHDL 3 08/29/2018 1543   VLDL 70.8 (H) 08/29/2018 1543   LDLCALC 75 08/28/2016 1016    CBC    Component Value Date/Time   WBC 11.3 (H) 08/29/2018 1543   RBC 4.28 08/29/2018 1543   HGB 12.6 08/29/2018 1543   HGB 12.3 05/10/2007 1305   HCT 37.9 08/29/2018 1543   HCT 35.8 05/10/2007 1305   PLT 560.0 (H) 08/29/2018 1543   PLT 515 (H) 05/10/2007 1305   MCV 88.7 08/29/2018 1543   MCV 82.8 05/10/2007 1305   MCH 29.5 02/06/2017 0548   MCHC 33.3 08/29/2018 1543   RDW 16.4 (H) 08/29/2018 1543   RDW 15.1 (H) 05/10/2007 1305   LYMPHSABS 3.1 04/13/2017 1449   LYMPHSABS 2.4  05/10/2007 1305   MONOABS 0.6 04/13/2017 1449   MONOABS 0.4 05/10/2007 1305   EOSABS 0.1 04/13/2017 1449   EOSABS 0.1 05/10/2007 1305   BASOSABS 0.1 04/13/2017 1449   BASOSABS 0.0 05/10/2007 1305    Hgb A1C No results found for: HGBA1C      Assessment and Plan:  Flight Anxiety:  RX for Xanax 0.5 mg 05.-1 tab PO 1 hr prior to flying- sedation caution given  Return precautions discussed Follow Up Instructions:  I discussed the assessment and treatment plan with the patient. The patient was provided an opportunity to ask questions and all were answered. The patient agreed with the plan and demonstrated an understanding of the instructions.   The patient was advised to call back or seek an in-person evaluation if the symptoms worsen or if the condition fails to improve as anticipated.     Nicki Reaper, NP \

## 2019-11-24 NOTE — Telephone Encounter (Signed)
Pt left v/m requesting virtual appt today due to pt being anxious about taking a flight on 11/26/19. Pt already has my chart virtual appt on 11/24/19 at 2:15 with Pamala Hurry NP.

## 2019-11-24 NOTE — Patient Instructions (Signed)

## 2019-12-05 ENCOUNTER — Encounter: Payer: BC Managed Care – PPO | Admitting: Internal Medicine

## 2019-12-05 DIAGNOSIS — Z0289 Encounter for other administrative examinations: Secondary | ICD-10-CM

## 2019-12-05 NOTE — Progress Notes (Deleted)
Subjective:    Patient ID: [name], female    DOB: [birth year], 49 y.o.   MRN: [id number]  HPI    Review of Systems      Past Medical History:  Diagnosis Date  . Allergy   . Anxiety   . GERD (gastroesophageal reflux disease)   . Hyperlipidemia   . Hypertension   . PVC (premature ventricular contraction) 07/20/2016  . Sinus tachycardia 07/20/2016    Current Outpatient Medications  Medication Sig Dispense Refill  . ALPRAZolam (XANAX) 0.5 MG tablet Take 0.5-1 tablets (0.25-0.5 mg total) by mouth at bedtime as needed for anxiety. 10 tablet 0  . amLODipine (NORVASC) 10 MG tablet TAKE 1 TABLET(10 MG) BY MOUTH DAILY 90 tablet 0  . atorvastatin (LIPITOR) 10 MG tablet Take 1 tablet (10 mg total) by mouth daily at 6 PM. MUST SCHEDULE PHYSICAL 90 tablet 0  . cyclobenzaprine (FLEXERIL) 5 MG tablet Take 1 tablet (5 mg total) by mouth at bedtime as needed for muscle spasms. 20 tablet 0  . losartan (COZAAR) 25 MG tablet TAKE 1 TABLET(25 MG) BY MOUTH DAILY 90 tablet 0  . omeprazole (PRILOSEC) 20 MG capsule Take 1 capsule (20 mg total) by mouth daily. MUST SCHEDULE PHYSICAL 90 capsule 0  . venlafaxine XR (EFFEXOR-XR) 75 MG 24 hr capsule Take 1 capsule (75 mg total) by mouth daily with breakfast. MUST SCHEDULE PHYSICAL 90 capsule 0   No current facility-administered medications for this visit.    Allergies  Allergen Reactions  . Benazepril Cough    Family History  Problem Relation Age of Onset  . Hypertension Mother   . Heart murmur Father   . Ovarian cancer Paternal Grandmother   . Lupus Sister   . Colon cancer Neg Hx   . Esophageal cancer Neg Hx   . Pancreatic cancer Neg Hx   . Rectal cancer Neg Hx   . Stomach cancer Neg Hx     Social History   Socioeconomic History  . Marital status: Married    Spouse name: Not on file  . Number of children: Not on file  . Years of education: Not on file  . Highest education level: Not on file  Occupational History  . Not on  file  Tobacco Use  . Smoking status: Light Tobacco Smoker    Packs/day: 0.30  . Smokeless tobacco: Never Used  . Tobacco comment: black-n-milds   Substance and Sexual Activity  . Alcohol use: Yes    Comment: occasional   . Drug use: Yes    Types: Marijuana    Comment: occasional   . Sexual activity: Yes    Birth control/protection: Surgical  Other Topics Concern  . Not on file  Social History Narrative  . Not on file   Social Determinants of Health   Financial Resource Strain:   . Difficulty of Paying Living Expenses:   Food Insecurity:   . Worried About Programme researcher, broadcasting/film/video in the Last Year:   . Barista in the Last Year:   Transportation Needs:   . Freight forwarder (Medical):   Marland Kitchen Lack of Transportation (Non-Medical):   Physical Activity:   . Days of Exercise per Week:   . Minutes of Exercise per Session:   Stress:   . Feeling of Stress :   Social Connections:   . Frequency of Communication with Friends and Family:   . Frequency of Social Gatherings with Friends and Family:   . Attends  Religious Services:   . Active Member of Clubs or Organizations:   . Attends Archivist Meetings:   Marland Kitchen Marital Status:   Intimate Partner Violence:   . Fear of Current or Ex-Partner:   . Emotionally Abused:   Marland Kitchen Physically Abused:   . Sexually Abused:      Constitutional: Denies fever, malaise, fatigue, headache or abrupt weight changes.  HEENT: Denies eye pain, eye redness, ear pain, ringing in the ears, wax buildup, runny nose, nasal congestion, bloody nose, or sore throat. Respiratory: Denies difficulty breathing, shortness of breath, cough or sputum production.   Cardiovascular: Denies chest pain, chest tightness, palpitations or swelling in the hands or feet.  Gastrointestinal: Denies abdominal pain, bloating, constipation, diarrhea or blood in the stool.  GU: Denies urgency, frequency, pain with urination, burning sensation, blood in urine, odor or  discharge. Musculoskeletal: Denies decrease in range of motion, difficulty with gait, muscle pain or joint pain and swelling.  Skin: Denies redness, rashes, lesions or ulcercations.  Neurological: Denies dizziness, difficulty with memory, difficulty with speech or problems with balance and coordination.  Psych: Denies anxiety, depression, SI/HI.  No other specific complaints in a complete review of systems (except as listed in HPI above).  Objective:   Physical Exam There were no vitals taken for this visit. Wt Readings from Last 3 Encounters:  08/29/18 140 lb (63.5 kg)  08/15/18 145 lb (65.8 kg)  05/26/18 135 lb (61.2 kg)    General: Appears their stated age, well developed, well nourished in NAD. Skin: Warm, dry and intact. No rashes, lesions or ulcerations noted. HEENT: Head: normal shape and size; Eyes: sclera white, no icterus, conjunctiva pink, PERRLA and EOMs intact; Ears: Tm's gray and intact, normal light reflex; Nose: mucosa pink and moist, septum midline; Throat/Mouth: Teeth present, mucosa pink and moist, no exudate, lesions or ulcerations noted.  Neck:  Neck supple, trachea midline. No masses, lumps or thyromegaly present.  Cardiovascular: Normal rate and rhythm. S1,S2 noted.  No murmur, rubs or gallops noted. No JVD or BLE edema. No carotid bruits noted. Pulmonary/Chest: Normal effort and positive vesicular breath sounds. No respiratory distress. No wheezes, rales or ronchi noted.  Abdomen: Soft and nontender. Normal bowel sounds. No distention or masses noted. Liver, spleen and kidneys non palpable. Musculoskeletal: Normal range of motion. No signs of joint swelling. No difficulty with gait.  Neurological: Alert and oriented. Cranial nerves II-XII grossly intact. Coordination normal.  Psychiatric: Mood and affect normal. Behavior is normal. Judgment and thought content normal.   EKG:  BMET    Component Value Date/Time   NA 139 08/29/2018 1543   K 3.2 (L) 08/29/2018  1543   CL 101 08/29/2018 1543   CO2 26 08/29/2018 1543   GLUCOSE 80 08/29/2018 1543   BUN 12 08/29/2018 1543   CREATININE 0.62 08/29/2018 1543   CALCIUM 9.3 08/29/2018 1543   GFRNONAA >60 02/06/2017 0548   GFRAA >60 02/06/2017 0548    Lipid Panel     Component Value Date/Time   CHOL 177 08/29/2018 1543   TRIG 354.0 (H) 08/29/2018 1543   HDL 55.10 08/29/2018 1543   CHOLHDL 3 08/29/2018 1543   VLDL 70.8 (H) 08/29/2018 1543   LDLCALC 75 08/28/2016 1016    CBC    Component Value Date/Time   WBC 11.3 (H) 08/29/2018 1543   RBC 4.28 08/29/2018 1543   HGB 12.6 08/29/2018 1543   HGB 12.3 05/10/2007 1305   HCT 37.9 08/29/2018 1543   HCT  35.8 05/10/2007 1305   PLT 560.0 (H) 08/29/2018 1543   PLT 515 (H) 05/10/2007 1305   MCV 88.7 08/29/2018 1543   MCV 82.8 05/10/2007 1305   MCH 29.5 02/06/2017 0548   MCHC 33.3 08/29/2018 1543   RDW 16.4 (H) 08/29/2018 1543   RDW 15.1 (H) 05/10/2007 1305   LYMPHSABS 3.1 04/13/2017 1449   LYMPHSABS 2.4 05/10/2007 1305   MONOABS 0.6 04/13/2017 1449   MONOABS 0.4 05/10/2007 1305   EOSABS 0.1 04/13/2017 1449   EOSABS 0.1 05/10/2007 1305   BASOSABS 0.1 04/13/2017 1449   BASOSABS 0.0 05/10/2007 1305    Hgb A1C No results found for: HGBA1C           Assessment & Plan:     Nicki Reaper, NP This visit occurred during the SARS-CoV-2 public health emergency.  Safety protocols were in place, including screening questions prior to the visit, additional usage of staff PPE, and extensive cleaning of exam room while observing appropriate contact time as indicated for disinfecting solutions.

## 2019-12-15 ENCOUNTER — Other Ambulatory Visit: Payer: Self-pay | Admitting: Internal Medicine

## 2019-12-28 ENCOUNTER — Encounter: Payer: BC Managed Care – PPO | Admitting: Internal Medicine

## 2019-12-28 DIAGNOSIS — Z0289 Encounter for other administrative examinations: Secondary | ICD-10-CM

## 2019-12-28 NOTE — Progress Notes (Deleted)
Subjective:    Patient ID: [name], female    DOB: 20-Jan-1971, 49 y.o.   MRN: [id number]  HPI  Pt presents to the clinic today for her annual exam. She is also due to follow up chronic conditions.  HTN: Her BP today is. She is taking Losartan and Amlodipine as prescribed. ECG from reviewed.  HLD: Her last LDL was. She is not taking any cholesterol lowering medication at this time. She tries to consume a low fat diet.  GERD: She denies breakthrough on Omeprazole. Upper GI from 12/2007 reviewed.  Menopausal Symptoms: Mainly hot flashes, difficulty with memory. Managed on Effexor.  Flu: 08/2018 Tetanus: 08/2018 Covid: Pap Smear: Mammogram: Colon Screening: Vision Screening: Dentist:  Diet: Exercise:  Review of Systems      Past Medical History:  Diagnosis Date  . Allergy   . Anxiety   . GERD (gastroesophageal reflux disease)   . Hyperlipidemia   . Hypertension   . PVC (premature ventricular contraction) 07/20/2016  . Sinus tachycardia 07/20/2016    Current Outpatient Medications  Medication Sig Dispense Refill  . ALPRAZolam (XANAX) 0.5 MG tablet Take 0.5-1 tablets (0.25-0.5 mg total) by mouth at bedtime as needed for anxiety. 10 tablet 0  . amLODipine (NORVASC) 10 MG tablet TAKE 1 TABLET(10 MG) BY MOUTH DAILY 90 tablet 0  . atorvastatin (LIPITOR) 10 MG tablet TAKE 1 TABLET(10 MG) BY MOUTH DAILY AT 6 PM 90 tablet 0  . cyclobenzaprine (FLEXERIL) 5 MG tablet Take 1 tablet (5 mg total) by mouth at bedtime as needed for muscle spasms. 20 tablet 0  . losartan (COZAAR) 25 MG tablet TAKE 1 TABLET(25 MG) BY MOUTH DAILY 90 tablet 0  . omeprazole (PRILOSEC) 20 MG capsule TAKE 1 CAPSULE(20 MG) BY MOUTH DAILY 90 capsule 0  . venlafaxine XR (EFFEXOR-XR) 75 MG 24 hr capsule TAKE 1 CAPSULE(75 MG) BY MOUTH DAILY WITH BREAKFAST 90 capsule 0   No current facility-administered medications for this visit.    Allergies  Allergen Reactions  . Benazepril Cough    Family  History  Problem Relation Age of Onset  . Hypertension Mother   . Heart murmur Father   . Ovarian cancer Paternal Grandmother   . Lupus Sister   . Colon cancer Neg Hx   . Esophageal cancer Neg Hx   . Pancreatic cancer Neg Hx   . Rectal cancer Neg Hx   . Stomach cancer Neg Hx     Social History   Socioeconomic History  . Marital status: Married    Spouse name: Not on file  . Number of children: Not on file  . Years of education: Not on file  . Highest education level: Not on file  Occupational History  . Not on file  Tobacco Use  . Smoking status: Light Tobacco Smoker    Packs/day: 0.30  . Smokeless tobacco: Never Used  . Tobacco comment: black-n-milds   Substance and Sexual Activity  . Alcohol use: Yes    Comment: occasional   . Drug use: Yes    Types: Marijuana    Comment: occasional   . Sexual activity: Yes    Birth control/protection: Surgical  Other Topics Concern  . Not on file  Social History Narrative  . Not on file   Social Determinants of Health   Financial Resource Strain:   . Difficulty of Paying Living Expenses:   Food Insecurity:   . Worried About Programme researcher, broadcasting/film/video in the Last Year:   .  Ran Out of Food in the Last Year:   Transportation Needs:   . Film/video editor (Medical):   Marland Kitchen Lack of Transportation (Non-Medical):   Physical Activity:   . Days of Exercise per Week:   . Minutes of Exercise per Session:   Stress:   . Feeling of Stress :   Social Connections:   . Frequency of Communication with Friends and Family:   . Frequency of Social Gatherings with Friends and Family:   . Attends Religious Services:   . Active Member of Clubs or Organizations:   . Attends Archivist Meetings:   Marland Kitchen Marital Status:   Intimate Partner Violence:   . Fear of Current or Ex-Partner:   . Emotionally Abused:   Marland Kitchen Physically Abused:   . Sexually Abused:      Constitutional: Denies fever, malaise, fatigue, headache or abrupt weight changes.    HEENT: Denies eye pain, eye redness, ear pain, ringing in the ears, wax buildup, runny nose, nasal congestion, bloody nose, or sore throat. Respiratory: Denies difficulty breathing, shortness of breath, cough or sputum production.   Cardiovascular: Denies chest pain, chest tightness, palpitations or swelling in the hands or feet.  Gastrointestinal: Denies abdominal pain, bloating, constipation, diarrhea or blood in the stool.  GU: Denies urgency, frequency, pain with urination, burning sensation, blood in urine, odor or discharge. Musculoskeletal: Denies decrease in range of motion, difficulty with gait, muscle pain or joint pain and swelling.  Skin: Denies redness, rashes, lesions or ulcercations.  Neurological: Denies dizziness, difficulty with memory, difficulty with speech or problems with balance and coordination.  Psych: Denies anxiety, depression, SI/HI.  No other specific complaints in a complete review of systems (except as listed in HPI above).  Objective:   Physical Exam  There were no vitals taken for this visit. Wt Readings from Last 3 Encounters:  08/29/18 140 lb (63.5 kg)  08/15/18 145 lb (65.8 kg)  05/26/18 135 lb (61.2 kg)    General: Appears their stated age, well developed, well nourished in NAD. Skin: Warm, dry and intact. No rashes, lesions or ulcerations noted. HEENT: Head: normal shape and size; Eyes: sclera white, no icterus, conjunctiva pink, PERRLA and EOMs intact; Ears: Tm's gray and intact, normal light reflex; Nose: mucosa pink and moist, septum midline; Throat/Mouth: Teeth present, mucosa pink and moist, no exudate, lesions or ulcerations noted.  Neck:  Neck supple, trachea midline. No masses, lumps or thyromegaly present.  Cardiovascular: Normal rate and rhythm. S1,S2 noted.  No murmur, rubs or gallops noted. No JVD or BLE edema. No carotid bruits noted. Pulmonary/Chest: Normal effort and positive vesicular breath sounds. No respiratory distress. No  wheezes, rales or ronchi noted.  Abdomen: Soft and nontender. Normal bowel sounds. No distention or masses noted. Liver, spleen and kidneys non palpable. Musculoskeletal: Normal range of motion. No signs of joint swelling. No difficulty with gait.  Neurological: Alert and oriented. Cranial nerves II-XII grossly intact. Coordination normal.  Psychiatric: Mood and affect normal. Behavior is normal. Judgment and thought content normal.   EKG:  BMET    Component Value Date/Time   NA 139 08/29/2018 1543   K 3.2 (L) 08/29/2018 1543   CL 101 08/29/2018 1543   CO2 26 08/29/2018 1543   GLUCOSE 80 08/29/2018 1543   BUN 12 08/29/2018 1543   CREATININE 0.62 08/29/2018 1543   CALCIUM 9.3 08/29/2018 1543   GFRNONAA >60 02/06/2017 0548   GFRAA >60 02/06/2017 0548    Lipid Panel  Component Value Date/Time   CHOL 177 08/29/2018 1543   TRIG 354.0 (H) 08/29/2018 1543   HDL 55.10 08/29/2018 1543   CHOLHDL 3 08/29/2018 1543   VLDL 70.8 (H) 08/29/2018 1543   LDLCALC 75 08/28/2016 1016    CBC    Component Value Date/Time   WBC 11.3 (H) 08/29/2018 1543   RBC 4.28 08/29/2018 1543   HGB 12.6 08/29/2018 1543   HGB 12.3 05/10/2007 1305   HCT 37.9 08/29/2018 1543   HCT 35.8 05/10/2007 1305   PLT 560.0 (H) 08/29/2018 1543   PLT 515 (H) 05/10/2007 1305   MCV 88.7 08/29/2018 1543   MCV 82.8 05/10/2007 1305   MCH 29.5 02/06/2017 0548   MCHC 33.3 08/29/2018 1543   RDW 16.4 (H) 08/29/2018 1543   RDW 15.1 (H) 05/10/2007 1305   LYMPHSABS 3.1 04/13/2017 1449   LYMPHSABS 2.4 05/10/2007 1305   MONOABS 0.6 04/13/2017 1449   MONOABS 0.4 05/10/2007 1305   EOSABS 0.1 04/13/2017 1449   EOSABS 0.1 05/10/2007 1305   BASOSABS 0.1 04/13/2017 1449   BASOSABS 0.0 05/10/2007 1305    Hgb A1C No results found for: HGBA1C         Assessment & Plan:     Nicki Reaper, NP This visit occurred during the SARS-CoV-2 public health emergency.  Safety protocols were in place, including screening  questions prior to the visit, additional usage of staff PPE, and extensive cleaning of exam room while observing appropriate contact time as indicated for disinfecting solutions.

## 2020-03-14 ENCOUNTER — Other Ambulatory Visit: Payer: Self-pay | Admitting: Internal Medicine

## 2020-03-15 ENCOUNTER — Other Ambulatory Visit: Payer: Self-pay | Admitting: Internal Medicine

## 2020-03-27 ENCOUNTER — Encounter: Payer: Self-pay | Admitting: Internal Medicine

## 2020-03-27 ENCOUNTER — Ambulatory Visit (INDEPENDENT_AMBULATORY_CARE_PROVIDER_SITE_OTHER): Payer: BC Managed Care – PPO | Admitting: Internal Medicine

## 2020-03-27 ENCOUNTER — Other Ambulatory Visit: Payer: Self-pay

## 2020-03-27 ENCOUNTER — Encounter: Payer: Self-pay | Admitting: Gastroenterology

## 2020-03-27 VITALS — BP 126/88 | HR 94 | Temp 98.5°F | Ht 63.5 in | Wt 146.0 lb

## 2020-03-27 DIAGNOSIS — I10 Essential (primary) hypertension: Secondary | ICD-10-CM | POA: Diagnosis not present

## 2020-03-27 DIAGNOSIS — Z1211 Encounter for screening for malignant neoplasm of colon: Secondary | ICD-10-CM

## 2020-03-27 DIAGNOSIS — K219 Gastro-esophageal reflux disease without esophagitis: Secondary | ICD-10-CM

## 2020-03-27 DIAGNOSIS — E7800 Pure hypercholesterolemia, unspecified: Secondary | ICD-10-CM

## 2020-03-27 DIAGNOSIS — N951 Menopausal and female climacteric states: Secondary | ICD-10-CM | POA: Diagnosis not present

## 2020-03-27 DIAGNOSIS — Z23 Encounter for immunization: Secondary | ICD-10-CM | POA: Diagnosis not present

## 2020-03-27 DIAGNOSIS — Z1231 Encounter for screening mammogram for malignant neoplasm of breast: Secondary | ICD-10-CM

## 2020-03-27 DIAGNOSIS — Z0000 Encounter for general adult medical examination without abnormal findings: Secondary | ICD-10-CM | POA: Diagnosis not present

## 2020-03-27 MED ORDER — VENLAFAXINE HCL ER 150 MG PO CP24: 150.0000 mg | ORAL_CAPSULE | Freq: Every day | ORAL | 2 refills | Status: DC

## 2020-03-27 MED ORDER — OMEPRAZOLE 40 MG PO CPDR: 40.0000 mg | DELAYED_RELEASE_CAPSULE | Freq: Every day | ORAL | 2 refills | Status: DC

## 2020-03-27 NOTE — Patient Instructions (Signed)

## 2020-03-27 NOTE — Assessment & Plan Note (Signed)
Continue Losartan and Amlodipine Reinforced DASH diet CMET today

## 2020-03-27 NOTE — Progress Notes (Addendum)
Subjective:    Patient ID: [name], female    DOB: 1970/08/01, 49 y.o.   MRN: [id number]  HPI  Pt presents to the clinic today for her annual exam. She is also due to follow up chronic conditions.  HTN: Her BP today is 126/88. She is taking Losartan and Amlodipine as prescribed. ECG from 02/2017 reviewed.  HLD: Her last LDL was 84, 08/2018. She is taking Atorvastatin as prescribed. She does not consume a low fat diet.  GERD: She is having breakthrough on Omeprazole, mainly a cough at night. Upper GI from 12/2007 reviewed.  Menopausal Symptoms: Persistent hot flashes, foggy brain and mood swings. She is taking Venlafaxine as prescribed but does not like it is effective.  Flu: 08/2018 Tetanus: 08/2018 Covid: Pfizer Pap Smear: hysterectomy Mammogram: > 5 years ago Colon Screening: never Vision Screening: annually Dentist: annually  Diet: She does eat meat. She consumes fruits and veggies daily. She does eat some fried foods. She drinks mostly water, some soda. Exercise: 10000-12000 steps per day  Review of Systems      Past Medical History:  Diagnosis Date  . Allergy   . Anxiety   . GERD (gastroesophageal reflux disease)   . Hyperlipidemia   . Hypertension   . PVC (premature ventricular contraction) 07/20/2016  . Sinus tachycardia 07/20/2016    Current Outpatient Medications  Medication Sig Dispense Refill  . ALPRAZolam (XANAX) 0.5 MG tablet Take 0.5-1 tablets (0.25-0.5 mg total) by mouth at bedtime as needed for anxiety. 10 tablet 0  . amLODipine (NORVASC) 10 MG tablet TAKE 1 TABLET(10 MG) BY MOUTH DAILY 30 tablet 0  . atorvastatin (LIPITOR) 10 MG tablet TAKE 1 TABLET(10 MG) BY MOUTH DAILY AT 6 PM 30 tablet 0  . cyclobenzaprine (FLEXERIL) 5 MG tablet Take 1 tablet (5 mg total) by mouth at bedtime as needed for muscle spasms. 20 tablet 0  . losartan (COZAAR) 25 MG tablet TAKE 1 TABLET(25 MG) BY MOUTH DAILY 90 tablet 0  . omeprazole (PRILOSEC) 20 MG capsule TAKE 1  CAPSULE(20 MG) BY MOUTH DAILY 90 capsule 0  . venlafaxine XR (EFFEXOR-XR) 75 MG 24 hr capsule TAKE 1 CAPSULE(75 MG) BY MOUTH DAILY WITH BREAKFAST 90 capsule 0   No current facility-administered medications for this visit.    Allergies  Allergen Reactions  . Benazepril Cough    Family History  Problem Relation Age of Onset  . Hypertension Mother   . Heart murmur Father   . Ovarian cancer Paternal Grandmother   . Lupus Sister   . Colon cancer Neg Hx   . Esophageal cancer Neg Hx   . Pancreatic cancer Neg Hx   . Rectal cancer Neg Hx   . Stomach cancer Neg Hx     Social History   Socioeconomic History  . Marital status: Married    Spouse name: Not on file  . Number of children: Not on file  . Years of education: Not on file  . Highest education level: Not on file  Occupational History  . Not on file  Tobacco Use  . Smoking status: Light Tobacco Smoker    Packs/day: 0.30  . Smokeless tobacco: Never Used  . Tobacco comment: black-n-milds   Substance and Sexual Activity  . Alcohol use: Yes    Comment: occasional   . Drug use: Yes    Types: Marijuana    Comment: occasional   . Sexual activity: Yes    Birth control/protection: Surgical  Other Topics Concern  .  Not on file  Social History Narrative  . Not on file   Social Determinants of Health   Financial Resource Strain:   . Difficulty of Paying Living Expenses: Not on file  Food Insecurity:   . Worried About Programme researcher, broadcasting/film/video in the Last Year: Not on file  . Ran Out of Food in the Last Year: Not on file  Transportation Needs:   . Lack of Transportation (Medical): Not on file  . Lack of Transportation (Non-Medical): Not on file  Physical Activity:   . Days of Exercise per Week: Not on file  . Minutes of Exercise per Session: Not on file  Stress:   . Feeling of Stress : Not on file  Social Connections:   . Frequency of Communication with Friends and Family: Not on file  . Frequency of Social Gatherings  with Friends and Family: Not on file  . Attends Religious Services: Not on file  . Active Member of Clubs or Organizations: Not on file  . Attends Banker Meetings: Not on file  . Marital Status: Not on file  Intimate Partner Violence:   . Fear of Current or Ex-Partner: Not on file  . Emotionally Abused: Not on file  . Physically Abused: Not on file  . Sexually Abused: Not on file     Constitutional: Denies fever, malaise, fatigue, headache or abrupt weight changes.  HEENT: Denies eye pain, eye redness, ear pain, ringing in the ears, wax buildup, runny nose, nasal congestion, bloody nose, or sore throat. Respiratory: Pt reports nighttime cough. Denies difficulty breathing, shortness of breath, or sputum production.   Cardiovascular: Denies chest pain, chest tightness, palpitations or swelling in the hands or feet.  Gastrointestinal: Pt reports reflux. Denies abdominal pain, bloating, constipation, diarrhea or blood in the stool.  GU: Denies urgency, frequency, pain with urination, burning sensation, blood in urine, odor or discharge. Musculoskeletal: Denies decrease in range of motion, difficulty with gait, muscle pain or joint pain and swelling.  Skin: Denies redness, rashes, lesions or ulcercations.  Neurological: Pt reports hot flashes, brain fog. Denies dizziness, difficulty with memory, difficulty with speech or problems with balance and coordination.  Psych: Pt reports anxiety and mood swings. Denies depression, SI/HI.  No other specific complaints in a complete review of systems (except as listed in HPI above).  Objective:   Physical Exam  BP 126/88   Pulse 94   Temp 98.5 F (36.9 C) (Temporal)   Ht 5' 3.5" (1.613 m)   Wt 146 lb (66.2 kg)   SpO2 98%   BMI 25.46 kg/m   Wt Readings from Last 3 Encounters:  08/29/18 140 lb (63.5 kg)  08/15/18 145 lb (65.8 kg)  05/26/18 135 lb (61.2 kg)    General: Appears her stated age, well developed, well nourished in  NAD. Skin: Warm, dry and intact. No rashes noted. HEENT: Head: normal shape and size; Eyes: sclera white, no icterus, conjunctiva pink, PERRLA and EOMs intact;  Neck:  Neck supple, trachea midline. No masses, lumps or thyromegaly present.  Cardiovascular: Normal rate and rhythm. S1,S2 noted.  No murmur, rubs or gallops noted. No JVD or BLE edema. No carotid bruits noted. Pulmonary/Chest: Normal effort and positive vesicular breath sounds. No respiratory distress. No wheezes, rales or ronchi noted.  Abdomen: Soft and nontender. Normal bowel sounds. No distention or masses noted. Liver, spleen and kidneys non palpable. Musculoskeletal: Strength 5/5 BUE/BLE. No difficulty with gait.  Neurological: Alert and oriented. Cranial nerves II-XII  grossly intact. Coordination normal.  Psychiatric: Mood and affect normal. Behavior is normal. Judgment and thought content normal.    BMET    Component Value Date/Time   NA 139 08/29/2018 1543   K 3.2 (L) 08/29/2018 1543   CL 101 08/29/2018 1543   CO2 26 08/29/2018 1543   GLUCOSE 80 08/29/2018 1543   BUN 12 08/29/2018 1543   CREATININE 0.62 08/29/2018 1543   CALCIUM 9.3 08/29/2018 1543   GFRNONAA >60 02/06/2017 0548   GFRAA >60 02/06/2017 0548    Lipid Panel     Component Value Date/Time   CHOL 177 08/29/2018 1543   TRIG 354.0 (H) 08/29/2018 1543   HDL 55.10 08/29/2018 1543   CHOLHDL 3 08/29/2018 1543   VLDL 70.8 (H) 08/29/2018 1543   LDLCALC 75 08/28/2016 1016    CBC    Component Value Date/Time   WBC 11.3 (H) 08/29/2018 1543   RBC 4.28 08/29/2018 1543   HGB 12.6 08/29/2018 1543   HGB 12.3 05/10/2007 1305   HCT 37.9 08/29/2018 1543   HCT 35.8 05/10/2007 1305   PLT 560.0 (H) 08/29/2018 1543   PLT 515 (H) 05/10/2007 1305   MCV 88.7 08/29/2018 1543   MCV 82.8 05/10/2007 1305   MCH 29.5 02/06/2017 0548   MCHC 33.3 08/29/2018 1543   RDW 16.4 (H) 08/29/2018 1543   RDW 15.1 (H) 05/10/2007 1305   LYMPHSABS 3.1 04/13/2017 1449    LYMPHSABS 2.4 05/10/2007 1305   MONOABS 0.6 04/13/2017 1449   MONOABS 0.4 05/10/2007 1305   EOSABS 0.1 04/13/2017 1449   EOSABS 0.1 05/10/2007 1305   BASOSABS 0.1 04/13/2017 1449   BASOSABS 0.0 05/10/2007 1305    Hgb A1C No results found for: HGBA1C         Assessment & Plan:   Preventative Health Maintenance:  Flu shot today Tetanus UTD Covid UTD She will call her insurance about Shingrix vaccine, make a nurse visit if she wants to get this She no longer needs pap smears Mammogram ordered, she will call [facility] to schedule, number provided Referral to GI for screening colonoscopy Encouraged her to consume a balanced diet and exercise regimen Advised her to see an eye doctor and dentist annually Will check CBC, CMET, Lipid and Vit D today  RTC in 1 year, sooner if needed Nicki Reaper, NP This visit occurred during the SARS-CoV-2 public health emergency.  Safety protocols were in place, including screening questions prior to the visit, additional usage of staff PPE, and extensive cleaning of exam room while observing appropriate contact time as indicated for disinfecting solutions.

## 2020-03-27 NOTE — Assessment & Plan Note (Signed)
Deteriorated Increase Omeprazole to 40 mg daily, RX sent to pharmacy D/w GI about possible upper endoscopy when you get your colonoscopy

## 2020-03-27 NOTE — Assessment & Plan Note (Signed)
Deteriorated Increase Venlafaxine to 150 mg daily, RX sent to pharmacy Support offered today Consider changing to Paxil if no improvement in symptoms.

## 2020-03-27 NOTE — Assessment & Plan Note (Signed)
CMET and Lipid profile today Encouraged her to consume a low fat diet Continue Atorvastatin 

## 2020-03-28 ENCOUNTER — Other Ambulatory Visit: Payer: Self-pay

## 2020-03-28 LAB — CBC
HCT: 33.9 % — ABNORMAL LOW (ref 36.0–46.0)
Hemoglobin: 11.1 g/dL — ABNORMAL LOW (ref 12.0–15.0)
MCHC: 32.9 g/dL (ref 30.0–36.0)
MCV: 90.2 fl (ref 78.0–100.0)
Platelets: 483 10*3/uL — ABNORMAL HIGH (ref 150.0–400.0)
RBC: 3.76 Mil/uL — ABNORMAL LOW (ref 3.87–5.11)
RDW: 15.9 % — ABNORMAL HIGH (ref 11.5–15.5)
WBC: 12.9 10*3/uL — ABNORMAL HIGH (ref 4.0–10.5)

## 2020-03-28 LAB — VITAMIN D 25 HYDROXY (VIT D DEFICIENCY, FRACTURES): VITD: 16.33 ng/mL — ABNORMAL LOW (ref 30.00–100.00)

## 2020-03-28 LAB — COMPREHENSIVE METABOLIC PANEL
ALT: 17 U/L (ref 0–35)
AST: 21 U/L (ref 0–37)
Albumin: 4.5 g/dL (ref 3.5–5.2)
Alkaline Phosphatase: 103 U/L (ref 39–117)
BUN: 11 mg/dL (ref 6–23)
CO2: 30 mEq/L (ref 19–32)
Calcium: 8.9 mg/dL (ref 8.4–10.5)
Chloride: 99 mEq/L (ref 96–112)
Creatinine, Ser: 0.64 mg/dL (ref 0.40–1.20)
GFR: 118.72 mL/min (ref >60.00)
Glucose, Bld: 88 mg/dL (ref 70–99)
Potassium: 3.2 mEq/L — ABNORMAL LOW (ref 3.5–5.1)
Sodium: 140 mEq/L (ref 135–145)
Total Bilirubin: 0.4 mg/dL (ref 0.2–1.2)
Total Protein: 7.8 g/dL (ref 6.0–8.3)

## 2020-03-28 LAB — LIPID PANEL
Cholesterol: 149 mg/dL (ref 0–200)
HDL: 55.2 mg/dL (ref >39.00)
NonHDL: 93.73
Total CHOL/HDL Ratio: 3
Triglycerides: 247 mg/dL — ABNORMAL HIGH (ref 0.0–149.0)
VLDL: 49.4 mg/dL — ABNORMAL HIGH (ref 0.0–40.0)

## 2020-03-28 LAB — LDL CHOLESTEROL, DIRECT: Direct LDL: 60 mg/dL

## 2020-03-29 ENCOUNTER — Encounter: Payer: Self-pay | Admitting: Internal Medicine

## 2020-03-29 DIAGNOSIS — E876 Hypokalemia: Secondary | ICD-10-CM

## 2020-03-29 MED ORDER — VITAMIN D (ERGOCALCIFEROL) 1.25 MG (50000 UNIT) PO CAPS: 50000.0000 [IU] | ORAL_CAPSULE | ORAL | 0 refills | Status: DC

## 2020-03-29 MED ORDER — POTASSIUM CHLORIDE ER 10 MEQ PO TBCR: 10.0000 meq | EXTENDED_RELEASE_TABLET | Freq: Every day | ORAL | 0 refills | Status: DC

## 2020-04-12 ENCOUNTER — Ambulatory Visit
Admission: RE | Admit: 2020-04-12 | Discharge: 2020-04-12 | Disposition: A | Discharge status: Home / Self Care | Payer: BC Managed Care – PPO | Source: Ambulatory Visit | Attending: Internal Medicine | Admitting: Internal Medicine

## 2020-04-12 ENCOUNTER — Other Ambulatory Visit: Payer: Self-pay

## 2020-04-13 ENCOUNTER — Other Ambulatory Visit: Payer: Self-pay | Admitting: Internal Medicine

## 2020-04-14 MED ORDER — LOSARTAN POTASSIUM 25 MG PO TABS: ORAL_TABLET | ORAL | 2 refills | Status: DC

## 2020-04-15 ENCOUNTER — Other Ambulatory Visit: Payer: Self-pay | Admitting: Internal Medicine

## 2020-04-28 ENCOUNTER — Other Ambulatory Visit: Payer: Self-pay | Admitting: Internal Medicine

## 2020-04-28 DIAGNOSIS — E876 Hypokalemia: Secondary | ICD-10-CM

## 2020-04-29 NOTE — Telephone Encounter (Signed)
Pt never restarted original Rx, pt has lab only appt scheduled

## 2020-05-06 ENCOUNTER — Telehealth: Payer: Self-pay

## 2020-05-06 NOTE — Telephone Encounter (Signed)
No show letter sent to patient, appt cancelled at this time;

## 2020-05-06 NOTE — Telephone Encounter (Signed)
Left message for patient to call back to the office to reschedule pre visit appt that the patient no showed today;

## 2020-05-13 ENCOUNTER — Other Ambulatory Visit: Payer: BC Managed Care – PPO

## 2020-05-15 ENCOUNTER — Telehealth (INDEPENDENT_AMBULATORY_CARE_PROVIDER_SITE_OTHER): Payer: BC Managed Care – PPO | Admitting: Family Medicine

## 2020-05-15 ENCOUNTER — Other Ambulatory Visit: Payer: BC Managed Care – PPO

## 2020-05-15 ENCOUNTER — Other Ambulatory Visit (INDEPENDENT_AMBULATORY_CARE_PROVIDER_SITE_OTHER): Payer: BC Managed Care – PPO | Admitting: Family Medicine

## 2020-05-15 ENCOUNTER — Encounter: Payer: Self-pay | Admitting: Family Medicine

## 2020-05-15 DIAGNOSIS — J069 Acute upper respiratory infection, unspecified: Secondary | ICD-10-CM

## 2020-05-15 LAB — POC INFLUENZA A&B (BINAX/QUICKVUE)
Influenza A, POC: NEGATIVE
Influenza B, POC: NEGATIVE

## 2020-05-15 MED ORDER — BENZONATATE 200 MG PO CAPS: 200.0000 mg | ORAL_CAPSULE | Freq: Three times a day (TID) | ORAL | 1 refills | Status: DC | PRN

## 2020-05-15 MED ORDER — PROMETHAZINE-DM 6.25-15 MG/5ML PO SYRP: 5.0000 mL | ORAL_SOLUTION | Freq: Four times a day (QID) | ORAL | 0 refills | Status: DC | PRN

## 2020-05-15 NOTE — Progress Notes (Signed)
Virtual Visit via Video Note  I connected with [name] on 05/15/20 at  8:00 AM EST by a video enabled telemedicine application and verified that I am speaking with the correct person using two identifiers.  Location: Patient: home Provider: office    I discussed the limitations of evaluation and management by telemedicine and the availability of in person appointments. The patient expressed understanding and agreed to proceed.  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 Viral URI with cough 05/15/2020  . GERD (gastroesophageal reflux disease) 08/29/2018  . HLD (hyperlipidemia) 08/29/2018  . Menopausal symptoms 08/30/2017  . HYPERTENSION, BENIGN ESSENTIAL 01/26/2007   Past Medical History:  Diagnosis Date  . Allergy   . Anxiety   . GERD (gastroesophageal reflux disease)   . Hyperlipidemia   . Hypertension   . PVC (premature ventricular contraction) 07/20/2016  . Sinus tachycardia 07/20/2016   Past Surgical History:  Procedure Laterality Date  . ABDOMINAL HYSTERECTOMY    . TUBAL LIGATION    . WISDOM TOOTH EXTRACTION     Social History   Tobacco Use  . Smoking status: Light Tobacco Smoker    Packs/day: 0.30  . Smokeless tobacco: Never Used  . Tobacco comment: black-n-milds   Substance Use Topics  . Alcohol use: Yes    Comment:  occasional   . Drug use: Yes    Types: Marijuana    Comment: occasional    Family History  Problem Relation Age of Onset  . Hypertension Mother   . Heart murmur Father   . Ovarian cancer Paternal Grandmother   . Lupus Sister   . Colon cancer Neg Hx   . Esophageal cancer Neg Hx   . Pancreatic cancer Neg Hx   . Rectal cancer Neg Hx   . Stomach cancer Neg Hx    Allergies  Allergen Reactions  . Benazepril Cough   Current Outpatient Medications on File Prior to Visit  Medication Sig Dispense Refill  . ALPRAZolam (XANAX) 0.5 MG tablet Take 0.5-1 tablets (0.25-0.5 mg total) by mouth at bedtime as needed for anxiety. 10 tablet 0  . amLODipine (NORVASC) 10 MG tablet TAKE 1 TABLET(10 MG) BY MOUTH DAILY 90 tablet 2  . atorvastatin (LIPITOR) 10 MG tablet TAKE 1 TABLET(10 MG) BY MOUTH DAILY AT 6 PM 90 tablet 0  . cyclobenzaprine (FLEXERIL) 5 MG tablet Take 1 tablet (5 mg total) by mouth at bedtime as needed for muscle spasms. 20 tablet 0  . losartan (COZAAR) 25 MG tablet TAKE 1 TABLET(25 MG) BY MOUTH DAILY 90 tablet 2  . omeprazole (PRILOSEC) 40 MG capsule Take 1 capsule (40 mg total) by mouth daily. 30 capsule 2  . potassium chloride (KLOR-CON) 10  MEQ tablet Take 1 tablet (10 mEq total) by mouth daily. 30 tablet 0  . venlafaxine XR (EFFEXOR XR) 150 MG 24 hr capsule Take 1 capsule (150 mg total) by mouth daily with breakfast. 30 capsule 2  . Vitamin D, Ergocalciferol, (DRISDOL) 1.25 MG (50000 UNIT) CAPS capsule Take 1 capsule (50,000 Units total) by mouth every 7 (seven) days. 12 capsule 0   No current facility-administered medications on file prior to visit.   Review of Systems  Constitutional: Positive for fever and malaise/fatigue. Negative for chills.  HENT: Positive for congestion and sore throat. Negative for ear pain and sinus pain.   Eyes: Negative for blurred vision, discharge and redness.  Respiratory: Positive for cough and sputum production. Negative for shortness of breath,  wheezing and stridor.   Cardiovascular: Negative for chest pain, palpitations and leg swelling.  Gastrointestinal: Positive for nausea. Negative for abdominal pain, diarrhea and vomiting.  Musculoskeletal: Negative for myalgias.  Skin: Negative for rash.  Neurological: Positive for headaches. Negative for dizziness.    Observations/Objective: Patient appears well, in no distress (seems fatigued)  Weight is baseline  No facial swelling or asymmetry Mildly hoarse No obvious tremor or mobility impairment Moving neck and UEs normally Able to hear the call well  occ hacking cough heard , no audible sob or wheeze Talkative and mentally sharp with no cognitive changes No skin changes on face or neck , no rash or pallor Affect is normal    Assessment and Plan: Problem List Items Addressed This Visit      Respiratory   Viral URI with cough    In covid and flu immunized pt  Disc symptom care  Tessalon and prometh dm sent in for cough (caution of sedation with prometh DM) Fluids/rest Nasal saline  Salt water gargle  Ordered flu and covid swab for this afternoon inst to isolate until results return Update if not starting to improve in a week or if worsening            Follow Up Instructions: Drink fluids and rest  Try tessalon and promethazine dm for cough (caution of sedation)  Salt water gargle for voice and sore thoat  Tylenol for fever/aches/pain /headache   The office will call to set up your flu and covid swabs  Please continue to isolate until we get a result    I discussed the assessment and treatment plan with the patient. The patient was provided an opportunity to ask questions and all were answered. The patient agreed with the plan and demonstrated an understanding of the instructions.   The patient was advised to call back or seek an in-person evaluation if the symptoms worsen or if the condition fails to improve as anticipated.     Roxy Manns, MD

## 2020-05-15 NOTE — Patient Instructions (Signed)
Drink fluids and rest  Try tessalon and promethazine dm for cough (caution of sedation)  Salt water gargle for voice and sore thoat  Tylenol for fever/aches/pain /headache   The office will call to set up your flu and covid swabs  Please continue to isolate until we get a result

## 2020-05-15 NOTE — Assessment & Plan Note (Signed)
In covid and flu immunized pt  Disc symptom care  Tessalon and prometh dm sent in for cough (caution of sedation with prometh DM) Fluids/rest Nasal saline  Salt water gargle  Ordered flu and covid swab for this afternoon inst to isolate until results return Update if not starting to improve in a week or if worsening

## 2020-05-17 ENCOUNTER — Encounter: Payer: BC Managed Care – PPO | Admitting: Gastroenterology

## 2020-05-18 LAB — NOVEL CORONAVIRUS, NAA: SARS-CoV-2, NAA: NOT DETECTED

## 2020-05-18 LAB — SPECIMEN STATUS REPORT

## 2020-05-18 LAB — SARS-COV-2, NAA 2 DAY TAT

## 2020-05-20 ENCOUNTER — Telehealth: Payer: Self-pay | Admitting: *Deleted

## 2020-05-20 NOTE — Telephone Encounter (Signed)
Left VM letting pt know letter is in mychart and advised on VM of Dr. Royden Purl comments regarding cough med

## 2020-05-20 NOTE — Telephone Encounter (Signed)
Patient left a voicemail stating that she got her covid test results back and plans on going back to work tomorrow. Patient stated that she needs a work note to go back. Patient stated that she still has a cough and congestion. Patient wants to know if you can give her something for her cough to take during the day? Pharmacy [facility]/[address]

## 2020-05-20 NOTE — Telephone Encounter (Signed)
Letter done If she cannot see it in mychart then please print   Nothing more I can px for cough will be ok for daily use because everything is sedating  The tessalon I sent originally and delsym otc may be her best bet

## 2020-05-26 ENCOUNTER — Other Ambulatory Visit: Payer: Self-pay | Admitting: Internal Medicine

## 2020-05-29 ENCOUNTER — Other Ambulatory Visit: Payer: BC Managed Care – PPO

## 2020-06-21 ENCOUNTER — Other Ambulatory Visit: Payer: Self-pay | Admitting: Internal Medicine

## 2020-07-03 ENCOUNTER — Other Ambulatory Visit: Payer: Self-pay | Admitting: Internal Medicine

## 2020-08-19 ENCOUNTER — Other Ambulatory Visit: Payer: Self-pay | Admitting: Internal Medicine

## 2020-09-18 ENCOUNTER — Telehealth (INDEPENDENT_AMBULATORY_CARE_PROVIDER_SITE_OTHER): Payer: BC Managed Care – PPO | Admitting: Family Medicine

## 2020-09-18 ENCOUNTER — Encounter: Payer: Self-pay | Admitting: Family Medicine

## 2020-09-18 ENCOUNTER — Encounter: Payer: Self-pay | Admitting: Internal Medicine

## 2020-09-18 DIAGNOSIS — R0989 Other specified symptoms and signs involving the circulatory and respiratory systems: Secondary | ICD-10-CM

## 2020-09-18 MED ORDER — HYDROCODONE-HOMATROPINE 5-1.5 MG/5ML PO SYRP: 5.0000 mL | ORAL_SOLUTION | Freq: Four times a day (QID) | ORAL | 0 refills | Status: AC | PRN

## 2020-09-18 MED ORDER — BENZONATATE 100 MG PO CAPS: 100.0000 mg | ORAL_CAPSULE | Freq: Three times a day (TID) | ORAL | 0 refills | Status: DC | PRN

## 2020-09-18 NOTE — Progress Notes (Signed)
Patient ID: [name], female   DOB: Jul 12, 1970, 50 y.o.   MRN: [id number]  This visit type was conducted due to national recommendations for restrictions regarding the COVID-19 pandemic in an effort to limit this patient's exposure and mitigate transmission in our community.   Virtual Visit via Video Note  I connected with [name] on 09/18/20 at 11:30 AM EDT by a video enabled telemedicine application and verified that I am speaking with the correct person using two identifiers.  Location patient: home Location provider:work or home office Persons participating in the virtual visit: patient, provider  I discussed the limitations of evaluation and management by telemedicine and the availability of in person appointments. The patient expressed understanding and agreed to proceed.   HPI:  [name] developed onset last Friday of upper respiratory symptoms.  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  She had a couple kids out recently with Covid.  She developed initially some sore throat followed by some sneezing, nasal congestion, body aches, cough.  She states she had Covid back in February 2021 and she states this is different and not as severe.  She has had 2 Covid vaccines but no booster.  No fever.  No dyspnea.  She had some leftover Tessalon and apparently Tussionex cough syrup which have helped some with her nighttime cough.  She would like to get a refill of cough medication if possible.  She has no underlying lung disease.  She does have hypertension but no other comorbidities  ROS: See pertinent positives and negatives per HPI.  Past Medical History:  Diagnosis Date  . Allergy   . Anxiety   . GERD (gastroesophageal reflux disease)   . Hyperlipidemia   . Hypertension   . PVC (premature ventricular contraction) 07/20/2016  . Sinus tachycardia 07/20/2016    Past Surgical History:  Procedure Laterality Date  . ABDOMINAL HYSTERECTOMY    . TUBAL LIGATION    .  WISDOM TOOTH EXTRACTION      Family History  Problem Relation Age of Onset  . Hypertension Mother   . Heart murmur Father   . Ovarian cancer Paternal Grandmother   . Lupus Sister   . Colon cancer Neg Hx   . Esophageal cancer Neg Hx   . Pancreatic cancer Neg Hx   . Rectal cancer Neg Hx   . Stomach cancer Neg Hx     SOCIAL HX: Non-smoker   Current Outpatient Medications:  .  ALPRAZolam (XANAX) 0.5 MG tablet, Take 0.5-1 tablets (0.25-0.5 mg total) by mouth at bedtime as needed for anxiety., Disp: 10 tablet, Rfl: 0 .  amLODipine (NORVASC) 10 MG tablet, TAKE 1 TABLET(10 MG) BY MOUTH DAILY, Disp: 90 tablet, Rfl: 2 .  atorvastatin (LIPITOR) 10 MG tablet, TAKE 1 TABLET(10 MG) BY MOUTH DAILY AT 6 PM, Disp: 90 tablet, Rfl: 0 .  benzonatate (TESSALON PERLES) 100 MG capsule, Take 1 capsule (100 mg total) by mouth 3 (three) times daily as needed for cough., Disp: 30 capsule, Rfl: 0 .  benzonatate (TESSALON) 200 MG capsule, Take 1 capsule (200 mg total) by mouth 3 (three) times daily as needed for cough. Swallow whole, do not bite pill, Disp: 30 capsule, Rfl: 1 .  cyclobenzaprine (FLEXERIL) 5 MG tablet, Take 1 tablet (5 mg total) by mouth at bedtime as needed for muscle spasms., Disp: 20 tablet, Rfl: 0 .  HYDROcodone-homatropine (HYCODAN) 5-1.5 MG/5ML syrup, Take 5 mLs by mouth every 6 (six) hours as needed for up to  10 days., Disp: 120 mL, Rfl: 0 .  losartan (COZAAR) 25 MG tablet, TAKE 1 TABLET(25 MG) BY MOUTH DAILY, Disp: 90 tablet, Rfl: 2 .  omeprazole (PRILOSEC) 20 MG capsule, TAKE 1 CAPSULE(20 MG) BY MOUTH DAILY, Disp: 90 capsule, Rfl: 1 .  omeprazole (PRILOSEC) 40 MG capsule, TAKE 1 CAPSULE(40 MG) BY MOUTH DAILY, Disp: 30 capsule, Rfl: 2 .  potassium chloride (KLOR-CON) 10 MEQ tablet, Take 1 tablet (10 mEq total) by mouth daily., Disp: 30 tablet, Rfl: 0 .  promethazine-dextromethorphan (PROMETHAZINE-DM) 6.25-15 MG/5ML syrup, Take 5 mLs by mouth 4 (four) times daily as needed for cough. Caution  of sedation, Disp: 118 mL, Rfl: 0 .  venlafaxine XR (EFFEXOR-XR) 150 MG 24 hr capsule, TAKE 1 CAPSULE(150 MG) BY MOUTH DAILY WITH BREAKFAST, Disp: 30 capsule, Rfl: 2 .  venlafaxine XR (EFFEXOR-XR) 75 MG 24 hr capsule, TAKE 1 CAPSULE(75 MG) BY MOUTH DAILY WITH BREAKFAST, Disp: 90 capsule, Rfl: 1 .  Vitamin D, Ergocalciferol, (DRISDOL) 1.25 MG (50000 UNIT) CAPS capsule, Take 1 capsule (50,000 Units total) by mouth every 7 (seven) days., Disp: 12 capsule, Rfl: 0  EXAM:  VITALS per patient if applicable:  GENERAL: alert, oriented, appears well and in no acute distress  HEENT: atraumatic, conjunttiva clear, no obvious abnormalities on inspection of external nose and ears  NECK: normal movements of the head and neck  LUNGS: on inspection no signs of respiratory distress, breathing rate appears normal, no obvious gross SOB, gasping or wheezing  CV: no obvious cyanosis  MS: moves all visible extremities without noticeable abnormality  PSYCH/NEURO: pleasant and cooperative, no obvious depression or anxiety, speech and thought processing grossly intact  ASSESSMENT AND PLAN:  Discussed the following assessment and plan:  Upper respiratory symptoms with cough.  No fever and no respiratory distress.  This sounds like a relatively low risk situation.  Sounds more likely viral.  Question is whether this is Covid versus other.  We discussed the following:  -She has home Covid test and we suggested that she go ahead and test to see if this is Covid -Follow-up promptly for any new fever or increased shortness of breath -Refill Tessalon Perles 100 mg every 8 hours as needed for cough -Refill Hycodan cough syrup 1 teaspoon nightly as needed for severe cough.  She is aware this may be sedating.     I discussed the assessment and treatment plan with the patient. The patient was provided an opportunity to ask questions and all were answered. The patient agreed with the plan and demonstrated an  understanding of the instructions.   The patient was advised to call back or seek an in-person evaluation if the symptoms worsen or if the condition fails to improve as anticipated.     Evelena Peat, MD

## 2020-10-13 ENCOUNTER — Other Ambulatory Visit: Payer: Self-pay | Admitting: Internal Medicine

## 2020-11-16 ENCOUNTER — Other Ambulatory Visit: Payer: Self-pay | Admitting: Internal Medicine

## 2020-11-18 ENCOUNTER — Telehealth: Payer: Self-pay

## 2020-11-18 NOTE — Telephone Encounter (Signed)
[facility] Night - Client TELEPHONE ADVICE RECORD AccessNurse Patient Name: [name] Gender: Female DOB: 07-Apr-1971 Age: 50 Y 10 M 29 D Return Phone Number: [phone] (Primary) Address: City/ State/ Zip: [place]  [address] Client [facility] Night - Client Client Site [facility] - Night Physician Nicki Reaper- NP Contact Type Call Who Is Calling Patient / Member / Family / Caregiver Call Type Triage / Clinical Relationship To Patient Self Return Phone Number [phone] (Primary) Chief Complaint Prescription Refill or Medication Request (non symptomatic) Reason for Call Medication Question / Request Initial Comment [name] states she has lost her prescriptions and is completely out. She is currently not having any sx. Additional Comment [name] states she is calling her pharmacy to ask about the loaner dose. Translation No Disp. Time Lamount Cohen Time) Disposition Final User 11/17/2020 11:40:18 AM Send To RN Personal Willeen Cass, RN, [facility] 11/17/2020 11:53:11 AM Clinical Call Yes Vear Clock, RN, Elease Hashimoto

## 2020-12-03 ENCOUNTER — Telehealth: Payer: Self-pay | Admitting: *Deleted

## 2020-12-03 ENCOUNTER — Telehealth (INDEPENDENT_AMBULATORY_CARE_PROVIDER_SITE_OTHER): Payer: BC Managed Care – PPO | Admitting: Family Medicine

## 2020-12-03 ENCOUNTER — Other Ambulatory Visit: Payer: BC Managed Care – PPO

## 2020-12-03 ENCOUNTER — Encounter: Payer: Self-pay | Admitting: Family Medicine

## 2020-12-03 VITALS — BP 148/97 | HR 83 | Wt 140.0 lb

## 2020-12-03 DIAGNOSIS — J0140 Acute pansinusitis, unspecified: Secondary | ICD-10-CM

## 2020-12-03 DIAGNOSIS — Z20822 Contact with and (suspected) exposure to covid-19: Secondary | ICD-10-CM | POA: Diagnosis not present

## 2020-12-03 MED ORDER — AMOXICILLIN-POT CLAVULANATE 875-125 MG PO TABS
1.0000 | ORAL_TABLET | Freq: Two times a day (BID) | ORAL | 0 refills | Status: AC
Start: 2020-12-03 — End: 2020-12-10

## 2020-12-03 MED ORDER — PROMETHAZINE-DM 6.25-15 MG/5ML PO SYRP: 5.0000 mL | ORAL_SOLUTION | Freq: Four times a day (QID) | ORAL | 0 refills | Status: DC | PRN

## 2020-12-03 NOTE — Patient Instructions (Signed)
  1. Drink plenty of fluids 2. Get lots of rest  Sinus Congestion 1) Neti Pot (Saline rinse) -- 2 times day -- if tolerated 2) Flonase (Store Brand ok) - once daily 3) Over the counter congestion medications  Cough 1) Cough drops can be helpful 2) Nyquil (or nighttime cough medication) 3) Honey is proven to be one of the best cough medications  4) Cough medicine with Dextromethorphan can also be helpful  Sore Throat 1) Honey as above, cough drops 2) Ibuprofen or Aleve can be helpful 3) Salt water Gargles    

## 2020-12-03 NOTE — Progress Notes (Signed)
I connected with [name] on 12/03/20 at 11:40 AM EDT by video and verified that I am speaking with the correct person using two identifiers.   I discussed the limitations, risks, security and privacy concerns of performing an evaluation and management service by video and the availability of in person appointments. I also discussed with the patient that there may be a patient responsible charge related to this service. The patient expressed understanding and agreed to proceed.  Patient location: Home Provider Location: [place] Participants: Lynnda Child and [name]   Subjective:     MILDRETH REEK is a 50 y.o. female presenting for Generalized Body Aches (Onset of symptoms 11/22/20), Cough, Nasal Congestion, Headache, Fatigue, and Covid Exposure (Granddaughter, who lives with pt, tested pos on 5/26)     HPI   #Covid exposure and symptoms - granddaughter is positive for covid and tested postive on 5/26 - body aches on 11/22/2020 - cough started on 5/22 - work on 5/23 but not feeling better - granddaughter started feeling sick and she was positive for covid -    Review of Systems  Constitutional: Positive for fatigue. Negative for chills and fever.  HENT: Positive for congestion, postnasal drip, sinus pressure and sinus pain. Negative for rhinorrhea, sneezing and sore throat.   Respiratory: Positive for cough. Negative for shortness of breath.   Cardiovascular: Negative for chest pain.  Gastrointestinal: Positive for diarrhea. Negative for nausea and vomiting.  Musculoskeletal: Positive for arthralgias and myalgias.  Neurological: Positive for numbness (fingers and legs) and headaches.     Social History   Tobacco Use  Smoking Status Light Tobacco Smoker  . Packs/day: 0.30  Smokeless Tobacco Never Used  Tobacco Comment   black-n-milds         Objective:   BP Readings from Last 3 Encounters:  12/03/20 (!) 148/97  03/27/20  126/88  08/25/19 (!) 125/91   Wt Readings from Last 3 Encounters:  12/03/20 140 lb (63.5 kg)  03/27/20 146 lb (66.2 kg)  08/29/18 140 lb (63.5 kg)   BP (!) 148/97   Pulse 83   Wt 140 lb (63.5 kg)   BMI 24.41 kg/m   Physical Exam Constitutional:      Appearance: Normal appearance. She is not ill-appearing.  HENT:     Head: Normocephalic and atraumatic.     Right Ear: External ear normal.     Left Ear: External ear normal.  Eyes:     Conjunctiva/sclera: Conjunctivae normal.  Pulmonary:     Effort: Pulmonary effort is normal. No respiratory distress.  Neurological:     Mental Status: She is alert. Mental status is at baseline.  Psychiatric:        Mood and Affect: Mood normal.        Behavior: Behavior normal.        Thought Content: Thought content normal.        Judgment: Judgment normal.             Assessment & Plan:   Problem List Items Addressed This Visit   None   Visit Diagnoses    Suspected COVID-19 virus infection    -  Primary   Relevant Medications   promethazine-dextromethorphan (PROMETHAZINE-DM) 6.25-15 MG/5ML syrup   Acute non-recurrent pansinusitis       Relevant Medications   amoxicillin-clavulanate (AUGMENTIN) 875-125 MG tablet   promethazine-dextromethorphan (PROMETHAZINE-DM) 6.25-15 MG/5ML syrup     Discussed already on day 11 of  symptoms. Offered testing, but at this point with suspected covid no management changes based on test results.   Advised waiting 1-2 more days and if no improvement reasonable to consider sinus infection and abx sent in for treatment   Return if symptoms worsen or fail to improve.  Lynnda Child, MD

## 2020-12-03 NOTE — Telephone Encounter (Signed)
See visit from today

## 2020-12-03 NOTE — Telephone Encounter (Signed)
Patient called stating that she started feeling bad over a week ago. Patient stated that she did a home test that came back negative. Patient stated that her granddaughter that lives wither her tested positive for covid. Patient stated that she went to [facility] Friday and did a covid test and she was called Saturday that it was negative. Patient stated that she is still achy and has no energy. Patient scheduled for a virtual visit with Dr. Selena Batten today at 11:40.

## 2020-12-25 ENCOUNTER — Other Ambulatory Visit: Payer: Self-pay | Admitting: Internal Medicine

## 2021-01-23 ENCOUNTER — Telehealth: Payer: Self-pay

## 2021-01-23 NOTE — Telephone Encounter (Signed)
[facility] Night - Client Nonclinical Telephone Record AccessNurse Client [facility] Night - Client Client Site [facility] [place] - Night Physician [facility] - PHYSICIAN, Crissie Figures- MD Contact Type Call Who Is Calling Patient / Member / Family / Caregiver [name] Name Malkia Nippert [name] Phone Number [phone] Patient Name [name] Patient DOB 12/14/1970 Call Type Message Only Information Provided Reason for Call Request to Schedule Office Appointment Initial Comment [name] states she is needing to schedule an appointment for tomorrow for post Covid symptoms. Patient request to speak to RN No Additional Comment Declined triage. Office hours provided. Disp. Time Disposition Final User 01/22/2021 5:40:10 PM General Information Provided Yes Warnell Bureau Call Closed By: Warnell Bureau Transaction Date/Time: 01/22/2021 5:37:56 PM (ET)

## 2021-01-27 NOTE — Telephone Encounter (Signed)
Spoke with patient did not want to scheduled appointment stated she starting to feel better

## 2021-01-28 NOTE — Telephone Encounter (Signed)
Spoke with patient stated she will like to establish care with the new  female provider will wait to make appointment

## 2021-01-28 NOTE — Telephone Encounter (Signed)
Pt will need a TOC apt with new provider. Her last OV with Rene Kocher B was 03/27/20. She will need to establish care so a new provider can follow her plan of care and be able to prescribe medications if needed.

## 2021-04-08 ENCOUNTER — Other Ambulatory Visit: Payer: Self-pay | Admitting: Internal Medicine

## 2021-04-08 NOTE — Telephone Encounter (Signed)
Omeprazole needs to be refused,, change in therapy. Last office visit 6/31/22 video Last refill Lipitor 10/15/20 #90 No upcoming appointment scheduled  Waiting on a new female provider

## 2021-04-10 ENCOUNTER — Other Ambulatory Visit: Payer: Self-pay | Admitting: Family

## 2021-04-18 ENCOUNTER — Other Ambulatory Visit: Payer: Self-pay | Admitting: Internal Medicine

## 2021-04-19 NOTE — Telephone Encounter (Signed)
Requested Prescriptions  Pending Prescriptions Disp Refills  . venlafaxine XR (EFFEXOR-XR) 150 MG 24 hr capsule [Pharmacy Med Name: VENLAFAXINE 150MG  ER CAPSULES] 30 capsule 0    Sig: TAKE 1 CAPSULE(150 MG) BY MOUTH DAILY WITH BREAKFAST     There is no refill protocol information for this order

## 2021-05-02 ENCOUNTER — Other Ambulatory Visit: Payer: Self-pay | Admitting: Nurse Practitioner

## 2021-05-02 ENCOUNTER — Telehealth: Payer: Self-pay | Admitting: Internal Medicine

## 2021-05-02 MED ORDER — LOSARTAN POTASSIUM 25 MG PO TABS: ORAL_TABLET | ORAL | 0 refills | Status: DC

## 2021-05-02 MED ORDER — AMLODIPINE BESYLATE 10 MG PO TABS: ORAL_TABLET | ORAL | 0 refills | Status: DC

## 2021-05-02 MED ORDER — ATORVASTATIN CALCIUM 10 MG PO TABS: ORAL_TABLET | ORAL | 0 refills | Status: DC

## 2021-05-02 MED ORDER — VENLAFAXINE HCL ER 150 MG PO CP24: ORAL_CAPSULE | ORAL | 0 refills | Status: DC

## 2021-05-02 NOTE — Telephone Encounter (Signed)
States she has an allergy to amlodipine? But looks like she has been taking it. Can we clarify please before I send in the medications

## 2021-05-02 NOTE — Telephone Encounter (Signed)
  Encourage patient to contact the pharmacy for refills or they can request refills through [facility]  LAST APPOINTMENT DATE:  Please schedule appointment if longer than 1 year  NEXT APPOINTMENT DATE:05/09/21  MEDICATION:amLODipine (NORVASC) 10 MG tablet,venlafaxine XR (EFFEXOR-XR) 150 MG 24 hr capsule,losartan (COZAAR) 25 MG tablet,atorvastatin (LIPITOR) 10 MG tablet  Is the patient out of medication?   PHARMACY:[id number] - [place], [place] - [address] AT [facility] [address]  Let patient know to contact pharmacy at the end of the day to make sure medication is ready.  Please notify patient to allow 48-72 hours to process  CLINICAL FILLS OUT ALL BELOW:   LAST REFILL:  QTY:  REFILL DATE:    OTHER COMMENTS:    Okay for refill?  Please advise

## 2021-05-02 NOTE — Telephone Encounter (Signed)
Spoke with patient. Patient will stay with [facility] and will cancel her appointment with [facility]. She ran out of her medications this week. I called [facility] pharmacy to see if they received refill authorization from [facility] on Effexor from 04/18/21 and was told they did not.  Pulled down all 4 medications for review.

## 2021-05-02 NOTE — Telephone Encounter (Signed)
error 

## 2021-05-02 NOTE — Telephone Encounter (Signed)
Left message for patient to call back. Per epic patient has an appt with Nicki Reaper on 05/05/21 at [facility] location and has a TOC to [facility] appointment on 05/09/21. Need to check with patient who she is planning on seen, can not be both. Also need to verify if patient has been out of her medications? If she has been taking these medication as directed her last refills would have put her to been out of most of her medications for a month or more

## 2021-05-02 NOTE — Telephone Encounter (Signed)
Spoke with patient. Lisinopril is the one that gave her cough. I updated her allergy list and took Amlodipine off the list

## 2021-05-05 ENCOUNTER — Ambulatory Visit: Payer: Self-pay | Admitting: Internal Medicine

## 2021-05-09 ENCOUNTER — Encounter: Payer: BC Managed Care – PPO | Admitting: Nurse Practitioner

## 2021-05-13 ENCOUNTER — Ambulatory Visit: Payer: Self-pay | Admitting: Internal Medicine

## 2021-05-13 NOTE — Progress Notes (Deleted)
Subjective:    Patient ID: [name], female    DOB: Feb 01, 1971, 50 y.o.   MRN: [id number]  HPI  Patient presents the clinic today for follow-up of chronic conditions.  HTN: Her BP today is.  She is taking Losartan and amlodipine as prescribed.  ECG from 02/2017 reviewed.  HLD: Her last LDL was 60, triglycerides 366, 03/2020.  She denies myalgias on Atorvastatin.  She has been trying to consume a low-fat diet.  Menopausal Symptoms: Mainly.  She is taking Venlafaxine as prescribed with good relief of symptoms.  GERD: Triggered by.  She denies breakthrough on Omeprazole.  There is no upper GI on file.  Anemia: Her last H/H was 11.1/33.1, 03/2020.  She is not taking any oral iron at this time.  She does not follow with hematology  Review of Systems     Past Medical History:  Diagnosis Date   Allergy    Anxiety    GERD (gastroesophageal reflux disease)    Hyperlipidemia    Hypertension    PVC (premature ventricular contraction) 07/20/2016   Sinus tachycardia 07/20/2016    Current Outpatient Medications  Medication Sig Dispense Refill   ALPRAZolam (XANAX) 0.5 MG tablet Take 0.5-1 tablets (0.25-0.5 mg total) by mouth at bedtime as needed for anxiety. 10 tablet 0   amLODipine (NORVASC) 10 MG tablet Take 1 tablet by mouth daily 90 tablet 0   atorvastatin (LIPITOR) 10 MG tablet TAKE 1 TABLET(10 MG) BY MOUTH DAILY AT 6 PM 90 tablet 0   benzonatate (TESSALON PERLES) 100 MG capsule Take 1 capsule (100 mg total) by mouth 3 (three) times daily as needed for cough. 30 capsule 0   cyclobenzaprine (FLEXERIL) 5 MG tablet Take 1 tablet (5 mg total) by mouth at bedtime as needed for muscle spasms. 20 tablet 0   losartan (COZAAR) 25 MG tablet TAKE 1 TABLET(25 MG) BY MOUTH DAILY 90 tablet 0   omeprazole (PRILOSEC) 40 MG capsule TAKE 1 CAPSULE(40 MG) BY MOUTH DAILY 90 capsule 1   potassium chloride (KLOR-CON) 10 MEQ tablet Take 1 tablet (10 mEq total) by mouth daily. 30 tablet 0    promethazine-dextromethorphan (PROMETHAZINE-DM) 6.25-15 MG/5ML syrup Take 5 mLs by mouth 4 (four) times daily as needed for cough. Caution of sedation 118 mL 0   venlafaxine XR (EFFEXOR-XR) 150 MG 24 hr capsule TAKE 1 CAPSULE(150 MG) BY MOUTH DAILY WITH BREAKFAST 30 capsule 0   No current facility-administered medications for this visit.    Allergies  Allergen Reactions   Benazepril Cough   Lisinopril Cough    Family History  Problem Relation Age of Onset   Hypertension Mother    Heart murmur Father    Ovarian cancer Paternal Grandmother    Lupus Sister    Colon cancer Neg Hx    Esophageal cancer Neg Hx    Pancreatic cancer Neg Hx    Rectal cancer Neg Hx    Stomach cancer Neg Hx     Social History   Socioeconomic History   Marital status: Married    Spouse name: Not on file   Number of children: Not on file   Years of education: Not on file   Highest education level: Not on file  Occupational History   Not on file  Tobacco Use   Smoking status: Light Smoker    Packs/day: 0.30    Types: Cigarettes   Smokeless tobacco: Never   Tobacco comments:    black-n-milds   Substance and Sexual  Activity   Alcohol use: Yes    Comment: occasional    Drug use: Yes    Types: Marijuana    Comment: occasional    Sexual activity: Yes    Birth control/protection: Surgical  Other Topics Concern   Not on file  Social History Narrative   Not on file   Social Determinants of Health   Financial Resource Strain: Not on file  Food Insecurity: Not on file  Transportation Needs: Not on file  Physical Activity: Not on file  Stress: Not on file  Social Connections: Not on file  Intimate Partner Violence: Not on file     Constitutional: Denies fever, malaise, fatigue, headache or abrupt weight changes.  HEENT: Denies eye pain, eye redness, ear pain, ringing in the ears, wax buildup, runny nose, nasal congestion, bloody nose, or sore throat. Respiratory: Denies difficulty  breathing, shortness of breath, cough or sputum production.   Cardiovascular: Denies chest pain, chest tightness, palpitations or swelling in the hands or feet.  Gastrointestinal: Denies abdominal pain, bloating, constipation, diarrhea or blood in the stool.  GU: Denies urgency, frequency, pain with urination, burning sensation, blood in urine, odor or discharge. Musculoskeletal: Denies decrease in range of motion, difficulty with gait, muscle pain or joint pain and swelling.  Skin: Denies redness, rashes, lesions or ulcercations.  Neurological: Denies dizziness, difficulty with memory, difficulty with speech or problems with balance and coordination.  Psych: Denies anxiety, depression, SI/HI.  No other specific complaints in a complete review of systems (except as listed in HPI above).  Objective:   Physical Exam  There were no vitals taken for this visit. Wt Readings from Last 3 Encounters:  12/03/20 140 lb (63.5 kg)  03/27/20 146 lb (66.2 kg)  08/29/18 140 lb (63.5 kg)    General: Appears their stated age, well developed, well nourished in NAD. Skin: Warm, dry and intact. No rashes, lesions or ulcerations noted. HEENT: Head: normal shape and size; Eyes: sclera white, no icterus, conjunctiva pink, PERRLA and EOMs intact; Ears: Tm's gray and intact, normal light reflex; Nose: mucosa pink and moist, septum midline; Throat/Mouth: Teeth present, mucosa pink and moist, no exudate, lesions or ulcerations noted.  Neck:  Neck supple, trachea midline. No masses, lumps or thyromegaly present.  Cardiovascular: Normal rate and rhythm. S1,S2 noted.  No murmur, rubs or gallops noted. No JVD or BLE edema. No carotid bruits noted. Pulmonary/Chest: Normal effort and positive vesicular breath sounds. No respiratory distress. No wheezes, rales or ronchi noted.  Abdomen: Soft and nontender. Normal bowel sounds. No distention or masses noted. Liver, spleen and kidneys non palpable. Musculoskeletal: Normal  range of motion. No signs of joint swelling. No difficulty with gait.  Neurological: Alert and oriented. Cranial nerves II-XII grossly intact. Coordination normal.  Psychiatric: Mood and affect normal. Behavior is normal. Judgment and thought content normal.     BMET    Component Value Date/Time   NA 140 03/27/2020 1459   K 3.2 (L) 03/27/2020 1459   CL 99 03/27/2020 1459   CO2 30 03/27/2020 1459   GLUCOSE 88 03/27/2020 1459   BUN 11 03/27/2020 1459   CREATININE 0.64 03/27/2020 1459   CALCIUM 8.9 03/27/2020 1459   GFRNONAA >60 02/06/2017 0548   GFRAA >60 02/06/2017 0548    Lipid Panel     Component Value Date/Time   CHOL 149 03/27/2020 1459   TRIG 247.0 (H) 03/27/2020 1459   HDL 55.20 03/27/2020 1459   CHOLHDL 3 03/27/2020 1459  VLDL 49.4 (H) 03/27/2020 1459   LDLCALC 75 08/28/2016 1016    CBC    Component Value Date/Time   WBC 12.9 (H) 03/27/2020 1459   RBC 3.76 (L) 03/27/2020 1459   HGB 11.1 (L) 03/27/2020 1459   HGB 12.3 05/10/2007 1305   HCT 33.9 (L) 03/27/2020 1459   HCT 35.8 05/10/2007 1305   PLT 483.0 (H) 03/27/2020 1459   PLT 515 (H) 05/10/2007 1305   MCV 90.2 03/27/2020 1459   MCV 82.8 05/10/2007 1305   MCH 29.5 02/06/2017 0548   MCHC 32.9 03/27/2020 1459   RDW 15.9 (H) 03/27/2020 1459   RDW 15.1 (H) 05/10/2007 1305   LYMPHSABS 3.1 04/13/2017 1449   LYMPHSABS 2.4 05/10/2007 1305   MONOABS 0.6 04/13/2017 1449   MONOABS 0.4 05/10/2007 1305   EOSABS 0.1 04/13/2017 1449   EOSABS 0.1 05/10/2007 1305   BASOSABS 0.1 04/13/2017 1449   BASOSABS 0.0 05/10/2007 1305    Hgb A1C No results found for: HGBA1C          Assessment & Plan:     Nicki Reaper, NP  This visit occurred during the SARS-CoV-2 public health emergency.  Safety protocols were in place, including screening questions prior to the visit, additional usage of staff PPE, and extensive cleaning of exam room while observing appropriate contact time as indicated for disinfecting  solutions.

## 2021-05-28 ENCOUNTER — Other Ambulatory Visit: Payer: Self-pay | Admitting: Nurse Practitioner

## 2021-05-28 ENCOUNTER — Other Ambulatory Visit: Payer: Self-pay | Admitting: Family Medicine

## 2021-05-28 DIAGNOSIS — Z20822 Contact with and (suspected) exposure to covid-19: Secondary | ICD-10-CM

## 2021-05-28 NOTE — Telephone Encounter (Signed)
I refilled this for the patient last month. I do not see where she has a TOC scheduled with me or Wyatt Mage can we find out her plan? I will refill for one more month

## 2021-05-28 NOTE — Telephone Encounter (Signed)
Spoke with patient and scheduled TOC with Matt on 12/2

## 2021-05-31 ENCOUNTER — Other Ambulatory Visit: Payer: Self-pay | Admitting: Family Medicine

## 2021-05-31 ENCOUNTER — Telehealth: Payer: BC Managed Care – PPO | Admitting: Emergency Medicine

## 2021-05-31 DIAGNOSIS — B9689 Other specified bacterial agents as the cause of diseases classified elsewhere: Secondary | ICD-10-CM

## 2021-05-31 DIAGNOSIS — J208 Acute bronchitis due to other specified organisms: Secondary | ICD-10-CM | POA: Diagnosis not present

## 2021-05-31 DIAGNOSIS — Z20822 Contact with and (suspected) exposure to covid-19: Secondary | ICD-10-CM

## 2021-05-31 MED ORDER — PREDNISONE 10 MG (21) PO TBPK: ORAL_TABLET | Freq: Every day | ORAL | 0 refills | Status: DC

## 2021-05-31 MED ORDER — ALBUTEROL SULFATE HFA 108 (90 BASE) MCG/ACT IN AERS: 1.0000 | INHALATION_SPRAY | Freq: Four times a day (QID) | RESPIRATORY_TRACT | 0 refills | Status: DC | PRN

## 2021-05-31 MED ORDER — AZITHROMYCIN 250 MG PO TABS: ORAL_TABLET | ORAL | 0 refills | Status: AC

## 2021-05-31 NOTE — Patient Instructions (Signed)
[name], thank you for joining Rennis Harding, PA-C for today's virtual visit.  While this provider is not your primary care provider (PCP), if your PCP is located in our provider database this encounter information will be shared with them immediately following your visit.  Consent: (Patient) [name] provided verbal consent for this virtual visit at the beginning of the encounter.  Current Medications:  Current Outpatient Medications:    albuterol (VENTOLIN HFA) 108 (90 Base) MCG/ACT inhaler, Inhale 1-2 puffs into the lungs every 6 (six) hours as needed for wheezing or shortness of breath., Disp: 18 g, Rfl: 0   azithromycin (ZITHROMAX) 250 MG tablet, Take 2 tablets on day 1, then 1 tablet daily on days 2 through 5, Disp: 6 tablet, Rfl: 0   predniSONE (STERAPRED UNI-PAK 21 TAB) 10 MG (21) TBPK tablet, Take by mouth daily. Take 6 tabs by mouth daily  for 2 days, then 5 tabs for 2 days, then 4 tabs for 2 days, then 3 tabs for 2 days, 2 tabs for 2 days, then 1 tab by mouth daily for 2 days, Disp: 42 tablet, Rfl: 0   ALPRAZolam (XANAX) 0.5 MG tablet, Take 0.5-1 tablets (0.25-0.5 mg total) by mouth at bedtime as needed for anxiety., Disp: 10 tablet, Rfl: 0   amLODipine (NORVASC) 10 MG tablet, Take 1 tablet by mouth daily, Disp: 90 tablet, Rfl: 0   atorvastatin (LIPITOR) 10 MG tablet, TAKE 1 TABLET(10 MG) BY MOUTH DAILY AT 6 PM, Disp: 90 tablet, Rfl: 0   benzonatate (TESSALON PERLES) 100 MG capsule, Take 1 capsule (100 mg total) by mouth 3 (three) times daily as needed for cough., Disp: 30 capsule, Rfl: 0   cyclobenzaprine (FLEXERIL) 5 MG tablet, Take 1 tablet (5 mg total) by mouth at bedtime as needed for muscle spasms., Disp: 20 tablet, Rfl: 0   losartan (COZAAR) 25 MG tablet, TAKE 1 TABLET(25 MG) BY MOUTH DAILY, Disp: 90 tablet, Rfl: 0   omeprazole (PRILOSEC) 40 MG capsule, TAKE 1 CAPSULE(40 MG) BY MOUTH DAILY, Disp: 90 capsule, Rfl: 1   potassium chloride (KLOR-CON) 10 MEQ  tablet, Take 1 tablet (10 mEq total) by mouth daily., Disp: 30 tablet, Rfl: 0   promethazine-dextromethorphan (PROMETHAZINE-DM) 6.25-15 MG/5ML syrup, Take 5 mLs by mouth 4 (four) times daily as needed for cough. Caution of sedation, Disp: 118 mL, Rfl: 0   venlafaxine XR (EFFEXOR-XR) 150 MG 24 hr capsule, TAKE 1 CAPSULE(150 MG) BY MOUTH DAILY WITH BREAKFAST, Disp: 30 capsule, Rfl: 0   Medications ordered in this encounter:  Meds ordered this encounter  Medications   predniSONE (STERAPRED UNI-PAK 21 TAB) 10 MG (21) TBPK tablet    Sig: Take by mouth daily. Take 6 tabs by mouth daily  for 2 days, then 5 tabs for 2 days, then 4 tabs for 2 days, then 3 tabs for 2 days, 2 tabs for 2 days, then 1 tab by mouth daily for 2 days    Dispense:  42 tablet    Refill:  0    Order Specific Question:   Supervising Provider    Answer:   Hyacinth Meeker, BRIAN [[id number]]   albuterol (VENTOLIN HFA) 108 (90 Base) MCG/ACT inhaler    Sig: Inhale 1-2 puffs into the lungs every 6 (six) hours as needed for wheezing or shortness of breath.    Dispense:  18 g    Refill:  0    Order Specific Question:   Supervising Provider    Answer:  MILLER, BRIAN [[id number]]   azithromycin (ZITHROMAX) 250 MG tablet    Sig: Take 2 tablets on day 1, then 1 tablet daily on days 2 through 5    Dispense:  6 tablet    Refill:  0    Order Specific Question:   Supervising Provider    Answer:   Eber Hong [[id number]]     *If you need refills on other medications prior to your next appointment, please contact your pharmacy*  Follow-Up: Call back or seek an in-person evaluation if the symptoms worsen or if the condition fails to improve as anticipated.  Other Instructions Get plenty of rest and push fluids Prednisone taper prescribed.  Take as directed and to completion Albuterol inhaler as needed for coughing spasm and wheezing Azithromycin for possible bacterial bronchitis Use OTC zyrtec for nasal congestion, runny nose, and/or sore throat Use OTC  flonase for nasal congestion and runny nose Use medications daily for symptom relief Use OTC medications like ibuprofen or tylenol as needed fever or pain Follow up in person with urgent care or go to the ED if you have any new or worsening symptoms such as fever, worsening cough, shortness of breath, chest tightness, chest pain, turning blue, changes in mental status, etc...    If you have been instructed to have an in-person evaluation today at a local Urgent Care facility, please use the link below. It will take you to a list of all of our available [facility], including address, phone number and hours of operation. Please do not delay care.  [facility]  If you or a family member do not have a primary care provider, use the link below to schedule a visit and establish care. When you choose a [facility] primary care physician or advanced practice provider, you gain a long-term partner in health. Find a Primary Care Provider  Learn more about [facility]'s in-office and virtual care options:  - Get Care Now

## 2021-05-31 NOTE — Progress Notes (Signed)
Virtual Visit Consent   [name], you are scheduled for a virtual visit with a [facility] provider today.     Just as with appointments in the office, your consent must be obtained to participate.  Your consent will be active for this visit and any virtual visit you may have with one of our providers in the next 365 days.     If you have a MyChart account, a copy of this consent can be sent to you electronically.  All virtual visits are billed to your insurance company just like a traditional visit in the office.    As this is a virtual visit, video technology does not allow for your provider to perform a traditional examination.  This may limit your provider's ability to fully assess your condition.  If your provider identifies any concerns that need to be evaluated in person or the need to arrange testing (such as labs, EKG, etc.), we will make arrangements to do so.     Although advances in technology are sophisticated, we cannot ensure that it will always work on either your end or our end.  If the connection with a video visit is poor, the visit may have to be switched to a telephone visit.  With either a video or telephone visit, we are not always able to ensure that we have a secure connection.     I need to obtain your verbal consent now.   Are you willing to proceed with your visit today? Yes   [name] has provided verbal consent on 05/31/2021 for a virtual visit (video or telephone).   Rennis Harding, [place]   Date: 05/31/2021 11:49 AM   Virtual Visit via Video Note   I, Rennis Harding, connected with  [name]  ([id number], August 18, 1970) on 05/31/21 at 11:45 AM EST by a video-enabled telemedicine application and verified that I am speaking with the correct person using two identifiers.  Location: Patient: Virtual Visit Location Patient: Home Provider: Virtual Visit Location Provider: Home Office   I discussed the limitations of evaluation and  management by telemedicine and the availability of in person appointments. The patient expressed understanding and agreed to proceed.    History of Present Illness: [name] is a 50 y.o. who identifies as a female who was assigned female at birth, and is being seen today for sore throat and dry hacking cough x 1 week.  Admits to sick exposure at work.  Reports coughing spasms.  Denies alleviating factors.  Reports worse at night.  Reports similar symptoms in the past that improved with cough medication and steroid.  Reports night sweats, fatigue, runny nose, congestion, sore throat.  Denies chest pain, SOB, nausea, vomiting, changes in bowel or bladder function.    HPI: HPI  Problems:  Patient Active Problem List   Diagnosis Date Noted   GERD (gastroesophageal reflux disease) 08/29/2018   HLD (hyperlipidemia) 08/29/2018   Menopausal symptoms 08/30/2017   HYPERTENSION, BENIGN ESSENTIAL 01/26/2007    Allergies:  Allergies  Allergen Reactions   Benazepril Cough   Lisinopril Cough   Medications:  Current Outpatient Medications:    albuterol (VENTOLIN HFA) 108 (90 Base) MCG/ACT inhaler, Inhale 1-2 puffs into the lungs every 6 (six) hours as needed for wheezing or shortness of breath., Disp: 18 g, Rfl: 0   azithromycin (ZITHROMAX) 250 MG tablet, Take 2 tablets on day 1, then 1 tablet daily on days 2 through 5, Disp: 6 tablet, Rfl: 0  predniSONE (STERAPRED UNI-PAK 21 TAB) 10 MG (21) TBPK tablet, Take by mouth daily. Take 6 tabs by mouth daily  for 2 days, then 5 tabs for 2 days, then 4 tabs for 2 days, then 3 tabs for 2 days, 2 tabs for 2 days, then 1 tab by mouth daily for 2 days, Disp: 42 tablet, Rfl: 0   ALPRAZolam (XANAX) 0.5 MG tablet, Take 0.5-1 tablets (0.25-0.5 mg total) by mouth at bedtime as needed for anxiety., Disp: 10 tablet, Rfl: 0   amLODipine (NORVASC) 10 MG tablet, Take 1 tablet by mouth daily, Disp: 90 tablet, Rfl: 0   atorvastatin (LIPITOR) 10 MG tablet, TAKE 1  TABLET(10 MG) BY MOUTH DAILY AT 6 PM, Disp: 90 tablet, Rfl: 0   benzonatate (TESSALON PERLES) 100 MG capsule, Take 1 capsule (100 mg total) by mouth 3 (three) times daily as needed for cough., Disp: 30 capsule, Rfl: 0   cyclobenzaprine (FLEXERIL) 5 MG tablet, Take 1 tablet (5 mg total) by mouth at bedtime as needed for muscle spasms., Disp: 20 tablet, Rfl: 0   losartan (COZAAR) 25 MG tablet, TAKE 1 TABLET(25 MG) BY MOUTH DAILY, Disp: 90 tablet, Rfl: 0   omeprazole (PRILOSEC) 40 MG capsule, TAKE 1 CAPSULE(40 MG) BY MOUTH DAILY, Disp: 90 capsule, Rfl: 1   potassium chloride (KLOR-CON) 10 MEQ tablet, Take 1 tablet (10 mEq total) by mouth daily., Disp: 30 tablet, Rfl: 0   promethazine-dextromethorphan (PROMETHAZINE-DM) 6.25-15 MG/5ML syrup, Take 5 mLs by mouth 4 (four) times daily as needed for cough. Caution of sedation, Disp: 118 mL, Rfl: 0   venlafaxine XR (EFFEXOR-XR) 150 MG 24 hr capsule, TAKE 1 CAPSULE(150 MG) BY MOUTH DAILY WITH BREAKFAST, Disp: 30 capsule, Rfl: 0  Observations/Objective: Patient is well-developed, well-nourished in no acute distress.  Resting comfortably at home. Appears fatigued but nontoxic Head is normocephalic, atraumatic.  No labored breathing. Speaking full sentences and tolerating own secretions Speech is clear and coherent with logical content.  Patient is alert and oriented at baseline.   Assessment and Plan: 1. Acute bacterial bronchitis  Get plenty of rest and push fluids Prednisone taper prescribed.  Take as directed and to completion Albuterol inhaler as needed for coughing spasm and wheezing Azithromycin for possible bacterial bronchitis Use OTC zyrtec for nasal congestion, runny nose, and/or sore throat Use OTC flonase for nasal congestion and runny nose Use medications daily for symptom relief Use OTC medications like ibuprofen or tylenol as needed fever or pain Follow up in person with urgent care or go to the ED if you have any new or worsening  symptoms such as fever, worsening cough, shortness of breath, chest tightness, chest pain, turning blue, changes in mental status, etc...   Follow Up Instructions: I discussed the assessment and treatment plan with the patient. The patient was provided an opportunity to ask questions and all were answered. The patient agreed with the plan and demonstrated an understanding of the instructions.  A copy of instructions were sent to the patient via MyChart unless otherwise noted below.    The patient was advised to call back or seek an in-person evaluation if the symptoms worsen or if the condition fails to improve as anticipated.  Time:  I spent 10 minutes with the patient via telehealth technology discussing the above problems/concerns.    Rennis Harding, PA-C

## 2021-06-02 NOTE — Telephone Encounter (Signed)
Last office visit 05/31/2021 (video visit) with [place] for acute bacterial bronchitis.  Patient states in refill request she forgot to ask for refill on the cough syrup.  Last refilled 12/03/2020 for 118 ml with no refills.  TOC appointment scheduled with Audria Nine on 06/06/2021.

## 2021-06-06 ENCOUNTER — Encounter: Payer: BC Managed Care – PPO | Admitting: Nurse Practitioner

## 2021-06-27 ENCOUNTER — Telehealth: Payer: BC Managed Care – PPO | Admitting: Physician Assistant

## 2021-06-27 DIAGNOSIS — J069 Acute upper respiratory infection, unspecified: Secondary | ICD-10-CM

## 2021-06-27 MED ORDER — BENZONATATE 100 MG PO CAPS: 100.0000 mg | ORAL_CAPSULE | Freq: Three times a day (TID) | ORAL | 0 refills | Status: DC | PRN

## 2021-06-27 MED ORDER — IPRATROPIUM BROMIDE 0.03 % NA SOLN: 2.0000 | Freq: Two times a day (BID) | NASAL | 0 refills | Status: DC

## 2021-06-27 MED ORDER — PROMETHAZINE-DM 6.25-15 MG/5ML PO SYRP: 5.0000 mL | ORAL_SOLUTION | Freq: Four times a day (QID) | ORAL | 0 refills | Status: DC | PRN

## 2021-06-27 NOTE — Addendum Note (Signed)
Addended by: Margaretann Loveless on: 06/27/2021 05:56 PM   Modules accepted: Orders

## 2021-06-27 NOTE — Progress Notes (Signed)

## 2021-07-01 ENCOUNTER — Telehealth: Payer: Self-pay

## 2021-07-01 NOTE — Telephone Encounter (Signed)
Per access nurse note med was at pharmacy for pick up and pt was notified per access note.

## 2021-07-01 NOTE — Telephone Encounter (Signed)
From below message patient was able to pick up medications. No further action needed.   [facility] Night - Client TELEPHONE ADVICE RECORD AccessNurse Patient Name: [name] Gender: Female DOB: [birth year] Age: 50 Y 6 M 8 D Return Phone Number: [phone] (Primary) Address: City/ State/ Zip: [place]  [address] Client [facility] Night - Client Client Site [facility] - Night Provider AA - PHYSICIAN, NOT LISTED- MD Contact Type Call Who Is Calling Patient / Member / Family / Caregiver Call Type Triage / Clinical Relationship To Patient Self Return Phone Number [phone] (Primary) Chief Complaint Flu Symptom Reason for Call Medication Question / Request Initial Comment [name] states the Rx was not sent to the pharmacy. She sees Dr. Janell Quiet. Additional Comment [name] states she flu symptoms Translation No Nurse Assessment Nurse: Dian Queen, RN, Cordella Register Date/Time (Eastern Time): 06/27/2021 9:23:02 PM Please select the assessment type ---RX called in but not at pharm Additional Documentation ---[name] states rx of cough tablets, cough suppressants, and nasal spray were not at pharmacy. Pharmacy name and phone number. ---[facility] [phone] Has the office closed within the last 30 minutes? ---No Does the client directives allow for assistance with medications after hours? ---No Nurse: Dian Queen, RN, Cordella Register Date/Time (Eastern Time): 06/27/2021 9:19:18 PM Confirm and document reason for call. If symptomatic, describe symptoms. ---[name] states rx of nasal spray cough tablets and cough suppressants were not sent to her [facility] [phone] after her e-visit today. [name] states no worsening symptoms. Does the patient have any new or worsening symptoms? ---No Please document clinical information provided and list any resource used. ---Advised caller that after office  hours rx aren't called in and that I will call her pharmacy to verify if rx were received. [name] verbalized understanding. Disp. Time Lamount Cohen Time) Disposition Final User 06/27/2021 9:30:48 PM Pharmacy Call Dian Queen, RN, Cordella Register Reason: [facility] pharmacy and the verified that rxs were received and ready for pick up. PLEASE NOTE: All timestamps contained within this report are represented as [place] Standard Time. CONFIDENTIALTY NOTICE: This fax transmission is intended only for the addressee. It contains information that is legally privileged, confidential or otherwise protected from use or disclosure. If you are not the intended recipient, you are strictly prohibited from reviewing, disclosing, copying using or disseminating any of this information or taking any action in reliance on or regarding this information. If you have received this fax in error, please notify us immediately by telephone so that we can arrange for its return to [place]. Phone: [phone], Toll-Free: [phone], Fax: [phone] Page: 2 of 2 Call Id: [id number] 06/27/2021 9:32:29 PM Clinical Call Yes Dian Queen, RN, Cordella Register Comments User: Delman Cheadle, RN Date/Time Lamount Cohen Time): 06/27/2021 9:32:19 PM RN notified caller that all rxs are ready for pick up at her [facility]. [name] verbalized understanding.

## 2021-07-01 NOTE — Telephone Encounter (Signed)
[facility] Night - Client TELEPHONE ADVICE RECORD AccessNurse Patient Name: [name] Gender: Female DOB: [birth year] Age: 50 Y 6 M 8 D Return Phone Number: [phone] (Primary) Address: City/ State/ Zip: [place]  [address] Client [facility] Night - Client Client Site [facility] - Night Provider AA - PHYSICIAN, NOT LISTED- MD Contact Type Call Who Is Calling Patient / Member / Family / Caregiver Call Type Triage / Clinical Relationship To Patient Self Return Phone Number [phone] (Primary) Chief Complaint Flu Symptom Reason for Call Medication Question / Request Initial Comment [name] states the Rx was not sent to the pharmacy. She sees Dr. Janell Quiet. Additional Comment [name] states she flu symptoms Translation No Nurse Assessment Nurse: Dian Queen, RN, Cordella Register Date/Time (Eastern Time): 06/27/2021 9:23:02 PM Please select the assessment type ---RX called in but not at pharm Additional Documentation ---[name] states rx of cough tablets, cough suppressants, and nasal spray were not at pharmacy. Pharmacy name and phone number. ---[facility] [phone] Has the office closed within the last 30 minutes? ---No Does the client directives allow for assistance with medications after hours? ---No Nurse: Dian Queen, RN, Cordella Register Date/Time (Eastern Time): 06/27/2021 9:19:18 PM Confirm and document reason for call. If symptomatic, describe symptoms. ---[name] states rx of nasal spray cough tablets and cough suppressants were not sent to her [facility] [phone] after her e-visit today. [name] states no worsening symptoms. Does the patient have any new or worsening symptoms? ---No Please document clinical information provided and list any resource used. ---Advised caller that after office hours rx aren't called in and that I will call her pharmacy to verify if rx were received.  [name] verbalized understanding. Disp. Time Lamount Cohen Time) Disposition Final User 06/27/2021 9:30:48 PM Pharmacy Call Dian Queen, RN, Cordella Register Reason: [facility] pharmacy and the verified that rxs were received and ready for pick up. PLEASE NOTE: All timestamps contained within this report are represented as [place] Standard Time. CONFIDENTIALTY NOTICE: This fax transmission is intended only for the addressee. It contains information that is legally privileged, confidential or otherwise protected from use or disclosure. If you are not the intended recipient, you are strictly prohibited from reviewing, disclosing, copying using or disseminating any of this information or taking any action in reliance on or regarding this information. If you have received this fax in error, please notify us immediately by telephone so that we can arrange for its return to [place]. Phone: [phone], Toll-Free: [phone], Fax: [phone] Page: 2 of 2 Call Id: [id number] 06/27/2021 9:32:29 PM Clinical Call Yes Dian Queen, RN, Cordella Register Comments User: Delman Cheadle, RN Date/Time Lamount Cohen Time): 06/27/2021 9:32:19 PM RN notified caller that all rxs are ready for pick up at her [facility]. [name] verbalized understandin

## 2021-07-03 ENCOUNTER — Other Ambulatory Visit: Payer: Self-pay

## 2021-07-03 ENCOUNTER — Telehealth (INDEPENDENT_AMBULATORY_CARE_PROVIDER_SITE_OTHER): Payer: BC Managed Care – PPO | Admitting: Internal Medicine

## 2021-07-03 ENCOUNTER — Encounter: Payer: Self-pay | Admitting: Internal Medicine

## 2021-07-03 DIAGNOSIS — J069 Acute upper respiratory infection, unspecified: Secondary | ICD-10-CM | POA: Diagnosis not present

## 2021-07-03 MED ORDER — PREDNISONE 10 MG PO TABS: ORAL_TABLET | ORAL | 0 refills | Status: DC

## 2021-07-03 MED ORDER — AZITHROMYCIN 250 MG PO TABS: ORAL_TABLET | ORAL | 0 refills | Status: DC

## 2021-07-03 NOTE — Patient Instructions (Signed)

## 2021-07-03 NOTE — Progress Notes (Signed)
Virtual Visit via Video Note  I connected with [name] on 07/03/21 at 11:20 AM EST by a video enabled telemedicine application and verified that I am speaking with the correct person using two identifiers.  Location: Patient: Home Provider: Office  Persons participating in this video call: Nicki Reaper, NP and Elie Goody.   I discussed the limitations of evaluation and management by telemedicine and the availability of in person appointments. The patient expressed understanding and agreed to proceed.  History of Present Illness:  Patient reports fatigue, cough, chest congestion and shortness of breath.  This started 6 days ago. The cough is mostly nonproductive. She denies headache, runny nose, nasal congestion, ear pain, sore throat, nausea, vomiting or diarrhea. She has run a fever up to 102, but denies chills or body aches. She has not had sick contacts that she is aware of. She did an Evist 12/23 and was prescribed Tessalon and cough syrup which has not been effective. She has had a negative covid test at home.   Past Medical History:  Diagnosis Date   Allergy    Anxiety    GERD (gastroesophageal reflux disease)    Hyperlipidemia    Hypertension    PVC (premature ventricular contraction) 07/20/2016   Sinus tachycardia 07/20/2016    Current Outpatient Medications  Medication Sig Dispense Refill   albuterol (VENTOLIN HFA) 108 (90 Base) MCG/ACT inhaler Inhale 1-2 puffs into the lungs every 6 (six) hours as needed for wheezing or shortness of breath. 18 g 0   ALPRAZolam (XANAX) 0.5 MG tablet Take 0.5-1 tablets (0.25-0.5 mg total) by mouth at bedtime as needed for anxiety. 10 tablet 0   amLODipine (NORVASC) 10 MG tablet Take 1 tablet by mouth daily 90 tablet 0   atorvastatin (LIPITOR) 10 MG tablet TAKE 1 TABLET(10 MG) BY MOUTH DAILY AT 6 PM 90 tablet 0   benzonatate (TESSALON PERLES) 100 MG capsule Take 1 capsule (100 mg total) by mouth 3 (three) times daily as needed  for cough. 30 capsule 0   cyclobenzaprine (FLEXERIL) 5 MG tablet Take 1 tablet (5 mg total) by mouth at bedtime as needed for muscle spasms. 20 tablet 0   ipratropium (ATROVENT) 0.03 % nasal spray Place 2 sprays into both nostrils every 12 (twelve) hours. 30 mL 0   losartan (COZAAR) 25 MG tablet TAKE 1 TABLET(25 MG) BY MOUTH DAILY 90 tablet 0   omeprazole (PRILOSEC) 40 MG capsule TAKE 1 CAPSULE(40 MG) BY MOUTH DAILY 90 capsule 1   potassium chloride (KLOR-CON) 10 MEQ tablet Take 1 tablet (10 mEq total) by mouth daily. 30 tablet 0   predniSONE (STERAPRED UNI-PAK 21 TAB) 10 MG (21) TBPK tablet Take by mouth daily. Take 6 tabs by mouth daily  for 2 days, then 5 tabs for 2 days, then 4 tabs for 2 days, then 3 tabs for 2 days, 2 tabs for 2 days, then 1 tab by mouth daily for 2 days 42 tablet 0   promethazine-dextromethorphan (PROMETHAZINE-DM) 6.25-15 MG/5ML syrup Take 5 mLs by mouth 4 (four) times daily as needed for cough. Caution of sedation 118 mL 0   venlafaxine XR (EFFEXOR-XR) 150 MG 24 hr capsule TAKE 1 CAPSULE(150 MG) BY MOUTH DAILY WITH BREAKFAST 30 capsule 0   No current facility-administered medications for this visit.    Allergies  Allergen Reactions   Benazepril Cough   Lisinopril Cough    Family History  Problem Relation Age of Onset   Hypertension Mother  Heart murmur Father    Ovarian cancer Paternal Grandmother    Lupus Sister    Colon cancer Neg Hx    Esophageal cancer Neg Hx    Pancreatic cancer Neg Hx    Rectal cancer Neg Hx    Stomach cancer Neg Hx     Social History   Socioeconomic History   Marital status: Married    Spouse name: Not on file   Number of children: Not on file   Years of education: Not on file   Highest education level: Not on file  Occupational History   Not on file  Tobacco Use   Smoking status: Light Smoker    Packs/day: 0.30    Types: Cigarettes   Smokeless tobacco: Never   Tobacco comments:    black-n-milds   Substance and Sexual  Activity   Alcohol use: Yes    Comment: occasional    Drug use: Yes    Types: Marijuana    Comment: occasional    Sexual activity: Yes    Birth control/protection: Surgical  Other Topics Concern   Not on file  Social History Narrative   Not on file   Social Determinants of Health   Financial Resource Strain: Not on file  Food Insecurity: Not on file  Transportation Needs: Not on file  Physical Activity: Not on file  Stress: Not on file  Social Connections: Not on file  Intimate Partner Violence: Not on file     Constitutional: Pt reports fatigue, fever. Denies headache or abrupt weight changes.  HEENT: Denies eye pain, eye redness, ear pain, ringing in the ears, wax buildup, runny nose, nasal congestion, bloody nose, or sore throat. Respiratory: Patient reports cough and shortness of breath.  Denies difficulty breathing.   Cardiovascular: Denies chest pain, chest tightness, palpitations or swelling in the hands or feet.  Gastrointestinal: Denies abdominal pain, bloating, constipation, diarrhea or blood in the stool.   No other specific complaints in a complete review of systems (except as listed in HPI above).  Observations/Objective:  Wt Readings from Last 3 Encounters:  12/03/20 140 lb (63.5 kg)  03/27/20 146 lb (66.2 kg)  08/29/18 140 lb (63.5 kg)    General: Appears her stated age,appears unwell but in NAD. HEENT: Nose: no congestion noted; Throat/Mouth: no hoarseness noted Pulmonary/Chest: Normal effort. No respiratory distress.  Neurological: Alert and oriented.   BMET    Component Value Date/Time   NA 140 03/27/2020 1459   K 3.2 (L) 03/27/2020 1459   CL 99 03/27/2020 1459   CO2 30 03/27/2020 1459   GLUCOSE 88 03/27/2020 1459   BUN 11 03/27/2020 1459   CREATININE 0.64 03/27/2020 1459   CALCIUM 8.9 03/27/2020 1459   GFRNONAA >60 02/06/2017 0548   GFRAA >60 02/06/2017 0548    Lipid Panel     Component Value Date/Time   CHOL 149 03/27/2020 1459    TRIG 247.0 (H) 03/27/2020 1459   HDL 55.20 03/27/2020 1459   CHOLHDL 3 03/27/2020 1459   VLDL 49.4 (H) 03/27/2020 1459   LDLCALC 75 08/28/2016 1016    CBC    Component Value Date/Time   WBC 12.9 (H) 03/27/2020 1459   RBC 3.76 (L) 03/27/2020 1459   HGB 11.1 (L) 03/27/2020 1459   HGB 12.3 05/10/2007 1305   HCT 33.9 (L) 03/27/2020 1459   HCT 35.8 05/10/2007 1305   PLT 483.0 (H) 03/27/2020 1459   PLT 515 (H) 05/10/2007 1305   MCV 90.2 03/27/2020 1459  MCV 82.8 05/10/2007 1305   MCH 29.5 02/06/2017 0548   MCHC 32.9 03/27/2020 1459   RDW 15.9 (H) 03/27/2020 1459   RDW 15.1 (H) 05/10/2007 1305   LYMPHSABS 3.1 04/13/2017 1449   LYMPHSABS 2.4 05/10/2007 1305   MONOABS 0.6 04/13/2017 1449   MONOABS 0.4 05/10/2007 1305   EOSABS 0.1 04/13/2017 1449   EOSABS 0.1 05/10/2007 1305   BASOSABS 0.1 04/13/2017 1449   BASOSABS 0.0 05/10/2007 1305    Hgb A1C No results found for: HGBA1C      Assessment and Plan:  Upper Respiratory Infection:  Encouraged rest and fluids RX for Pred Taper x 6 days RX for Azithromax x 5 days She has Tessalon and cough syrup to use if needed She has an Albuterol inhaler to use if needed  Return precautions discussed  Follow Up Instructions:    I discussed the assessment and treatment plan with the patient. The patient was provided an opportunity to ask questions and all were answered. The patient agreed with the plan and demonstrated an understanding of the instructions.   The patient was advised to call back or seek an in-person evaluation if the symptoms worsen or if the condition fails to improve as anticipated.    Nicki Reaper, NP

## 2021-07-21 ENCOUNTER — Other Ambulatory Visit: Payer: Self-pay | Admitting: Family

## 2021-07-27 ENCOUNTER — Other Ambulatory Visit: Payer: Self-pay | Admitting: Nurse Practitioner

## 2021-08-20 ENCOUNTER — Telehealth: Payer: BC Managed Care – PPO | Admitting: Physician Assistant

## 2021-08-20 DIAGNOSIS — J0100 Acute maxillary sinusitis, unspecified: Secondary | ICD-10-CM

## 2021-08-20 MED ORDER — AMOXICILLIN-POT CLAVULANATE 875-125 MG PO TABS: 1.0000 | ORAL_TABLET | Freq: Two times a day (BID) | ORAL | 0 refills | Status: DC

## 2021-08-20 MED ORDER — PREDNISONE 20 MG PO TABS: 40.0000 mg | ORAL_TABLET | Freq: Every day | ORAL | 0 refills | Status: DC

## 2021-08-20 NOTE — Patient Instructions (Signed)
[name], thank you for joining Piedad Climes, PA-C for today's virtual visit.  While this provider is not your primary care provider (PCP), if your PCP is located in our provider database this encounter information will be shared with them immediately following your visit.  Consent: (Patient) [name] provided verbal consent for this virtual visit at the beginning of the encounter.  Current Medications:  Current Outpatient Medications:    albuterol (VENTOLIN HFA) 108 (90 Base) MCG/ACT inhaler, Inhale 1-2 puffs into the lungs every 6 (six) hours as needed for wheezing or shortness of breath., Disp: 18 g, Rfl: 0   amLODipine (NORVASC) 10 MG tablet, Take 1 tablet by mouth daily, Disp: 90 tablet, Rfl: 0   atorvastatin (LIPITOR) 10 MG tablet, TAKE 1 TABLET(10 MG) BY MOUTH DAILY AT 6 PM, Disp: 90 tablet, Rfl: 0   azithromycin (ZITHROMAX) 250 MG tablet, Take 2 tabs today, then 1 tab daily x 4 days, Disp: 6 tablet, Rfl: 0   benzonatate (TESSALON PERLES) 100 MG capsule, Take 1 capsule (100 mg total) by mouth 3 (three) times daily as needed for cough., Disp: 30 capsule, Rfl: 0   ipratropium (ATROVENT) 0.03 % nasal spray, Place 2 sprays into both nostrils every 12 (twelve) hours., Disp: 30 mL, Rfl: 0   losartan (COZAAR) 25 MG tablet, TAKE 1 TABLET(25 MG) BY MOUTH DAILY, Disp: 90 tablet, Rfl: 0   omeprazole (PRILOSEC) 40 MG capsule, TAKE 1 CAPSULE(40 MG) BY MOUTH DAILY, Disp: 90 capsule, Rfl: 1   predniSONE (DELTASONE) 10 MG tablet, Take 6 tabs on day 1, 5 tabs on day 2, 4 tabs on day 3, 3 tabs on day 4, 2 tabs on day 5, 1 tab on day 6, Disp: 21 tablet, Rfl: 0   promethazine-dextromethorphan (PROMETHAZINE-DM) 6.25-15 MG/5ML syrup, Take 5 mLs by mouth 4 (four) times daily as needed for cough. Caution of sedation, Disp: 118 mL, Rfl: 0   venlafaxine XR (EFFEXOR-XR) 150 MG 24 hr capsule, TAKE 1 CAPSULE(150 MG) BY MOUTH DAILY WITH BREAKFAST, Disp: 30 capsule, Rfl: 0   Medications  ordered in this encounter:  No orders of the defined types were placed in this encounter.    *If you need refills on other medications prior to your next appointment, please contact your pharmacy*  Follow-Up: Call back or seek an in-person evaluation if the symptoms worsen or if the condition fails to improve as anticipated.  Other Instructions Please take antibiotic as directed.  Increase fluid intake.  Use Saline nasal spray.  Take a daily multivitamin. Use the prednisone as directed. Tylenol for throat pain. Once completed with prednisone you can start to alternate Tylenol and ibuprofen as needed. Place a humidifier in the bedroom.  Please call or return clinic if symptoms are not improving.  Sinusitis Sinusitis is redness, soreness, and swelling (inflammation) of the paranasal sinuses. Paranasal sinuses are air pockets within the bones of your face (beneath the eyes, the middle of the forehead, or above the eyes). In healthy paranasal sinuses, mucus is able to drain out, and air is able to circulate through them by way of your nose. However, when your paranasal sinuses are inflamed, mucus and air can become trapped. This can allow bacteria and other germs to grow and cause infection. Sinusitis can develop quickly and last only a short time (acute) or continue over a long period (chronic). Sinusitis that lasts for more than 12 weeks is considered chronic.  CAUSES  Causes of sinusitis include: Allergies. Structural  abnormalities, such as displacement of the cartilage that separates your nostrils (deviated septum), which can decrease the air flow through your nose and sinuses and affect sinus drainage. Functional abnormalities, such as when the small hairs (cilia) that line your sinuses and help remove mucus do not work properly or are not present. SYMPTOMS  Symptoms of acute and chronic sinusitis are the same. The primary symptoms are pain and pressure around the affected sinuses. Other  symptoms include: Upper toothache. Earache. Headache. Bad breath. Decreased sense of smell and taste. A cough, which worsens when you are lying flat. Fatigue. Fever. Thick drainage from your nose, which often is green and may contain pus (purulent). Swelling and warmth over the affected sinuses. DIAGNOSIS  Your caregiver will perform a physical exam. During the exam, your caregiver may: Look in your nose for signs of abnormal growths in your nostrils (nasal polyps). Tap over the affected sinus to check for signs of infection. View the inside of your sinuses (endoscopy) with a special imaging device with a light attached (endoscope), which is inserted into your sinuses. If your caregiver suspects that you have chronic sinusitis, one or more of the following tests may be recommended: Allergy tests. Nasal culture A sample of mucus is taken from your nose and sent to a lab and screened for bacteria. Nasal cytology A sample of mucus is taken from your nose and examined by your caregiver to determine if your sinusitis is related to an allergy. TREATMENT  Most cases of acute sinusitis are related to a viral infection and will resolve on their own within 10 days. Sometimes medicines are prescribed to help relieve symptoms (pain medicine, decongestants, nasal steroid sprays, or saline sprays).  However, for sinusitis related to a bacterial infection, your caregiver will prescribe antibiotic medicines. These are medicines that will help kill the bacteria causing the infection.  Rarely, sinusitis is caused by a fungal infection. In theses cases, your caregiver will prescribe antifungal medicine. For some cases of chronic sinusitis, surgery is needed. Generally, these are cases in which sinusitis recurs more than 3 times per year, despite other treatments. HOME CARE INSTRUCTIONS  Drink plenty of water. Water helps thin the mucus so your sinuses can drain more easily. Use a humidifier. Inhale steam 3  to 4 times a day (for example, sit in the bathroom with the shower running). Apply a warm, moist washcloth to your face 3 to 4 times a day, or as directed by your caregiver. Use saline nasal sprays to help moisten and clean your sinuses. Take over-the-counter or prescription medicines for pain, discomfort, or fever only as directed by your caregiver. SEEK IMMEDIATE MEDICAL CARE IF: You have increasing pain or severe headaches. You have nausea, vomiting, or drowsiness. You have swelling around your face. You have vision problems. You have a stiff neck. You have difficulty breathing. MAKE SURE YOU:  Understand these instructions. Will watch your condition. Will get help right away if you are not doing well or get worse. Document Released: 06/22/2005 Document Revised: 09/14/2011 Document Reviewed: 07/07/2011 [facility] Patient Information 2014 [place], [place].   If you have been instructed to have an in-person evaluation today at a local Urgent Care facility, please use the link below. It will take you to a list of all of our available [facility], including address, phone number and hours of operation. Please do not delay care.   Urgent Cares  If you or a family member do not have a primary care  provider, use the link below to schedule a visit and establish care. When you choose a [facility] primary care physician or advanced practice provider, you gain a long-term partner in health. Find a Primary Care Provider  Learn more about [facility]'s in-office and virtual care options: [facility] - Get Care Now

## 2021-08-20 NOTE — Progress Notes (Signed)
Virtual Visit Consent   [name], you are scheduled for a virtual visit with a [facility] provider today.     Just as with appointments in the office, your consent must be obtained to participate.  Your consent will be active for this visit and any virtual visit you may have with one of our providers in the next 365 days.     If you have a MyChart account, a copy of this consent can be sent to you electronically.  All virtual visits are billed to your insurance company just like a traditional visit in the office.    As this is a virtual visit, video technology does not allow for your provider to perform a traditional examination.  This may limit your provider's ability to fully assess your condition.  If your provider identifies any concerns that need to be evaluated in person or the need to arrange testing (such as labs, EKG, etc.), we will make arrangements to do so.     Although advances in technology are sophisticated, we cannot ensure that it will always work on either your end or our end.  If the connection with a video visit is poor, the visit may have to be switched to a telephone visit.  With either a video or telephone visit, we are not always able to ensure that we have a secure connection.     I need to obtain your verbal consent now.   Are you willing to proceed with your visit today?    [name] has provided verbal consent on 08/20/2021 for a virtual visit (video or telephone).   Piedad Climes, [place]   Date: 08/20/2021 9:05 AM   Virtual Visit via Video Note   I, Piedad Climes, connected with  [name]  ([id number], Nov 23, 1970) on 08/20/21 at  9:00 AM EST by a video-enabled telemedicine application and verified that I am speaking with the correct person using two identifiers.  Location: Patient: Virtual Visit Location Patient: Other: Work Provider: Pharmacist, community: Home Office   I discussed the limitations of evaluation  and management by telemedicine and the availability of in person appointments. The patient expressed understanding and agreed to proceed.    History of Present Illness: [name] is a 51 y.o. who identifies as a female who was assigned female at birth, and is being seen today for URI symptoms starting last week and progressively worsening since Sunday.  Notes initially with head congestion, cough, fatigue and sore throat. Sunday started with fever that has been intermittent since then but still present. Now with bilateral maxillary sinus pain and tooth pain. Throat is very severe due to drainage and cough.  Cough has remained dry. No significant chest congestion.  HPI: HPI  Problems:  Patient Active Problem List   Diagnosis Date Noted   GERD (gastroesophageal reflux disease) 08/29/2018   HLD (hyperlipidemia) 08/29/2018   Menopausal symptoms 08/30/2017   HYPERTENSION, BENIGN ESSENTIAL 01/26/2007    Allergies:  Allergies  Allergen Reactions   Benazepril Cough   Lisinopril Cough   Medications:  Current Outpatient Medications:    amoxicillin-clavulanate (AUGMENTIN) 875-125 MG tablet, Take 1 tablet by mouth 2 (two) times daily., Disp: 14 tablet, Rfl: 0   predniSONE (DELTASONE) 20 MG tablet, Take 2 tablets (40 mg total) by mouth daily with breakfast., Disp: 6 tablet, Rfl: 0   amLODipine (NORVASC) 10 MG tablet, Take 1 tablet by mouth daily, Disp: 90 tablet, Rfl: 0  atorvastatin (LIPITOR) 10 MG tablet, TAKE 1 TABLET(10 MG) BY MOUTH DAILY AT 6 PM, Disp: 90 tablet, Rfl: 0   losartan (COZAAR) 25 MG tablet, TAKE 1 TABLET(25 MG) BY MOUTH DAILY, Disp: 90 tablet, Rfl: 0   omeprazole (PRILOSEC) 40 MG capsule, TAKE 1 CAPSULE(40 MG) BY MOUTH DAILY, Disp: 90 capsule, Rfl: 1   venlafaxine XR (EFFEXOR-XR) 150 MG 24 hr capsule, TAKE 1 CAPSULE(150 MG) BY MOUTH DAILY WITH BREAKFAST, Disp: 30 capsule, Rfl: 0  Observations/Objective: Patient is well-developed, well-nourished in no acute distress.   Resting comfortably at home.  Head is normocephalic, atraumatic.  No labored breathing. Speech is clear and coherent with logical content.  Patient is alert and oriented at baseline.   Assessment and Plan: 1. Acute non-recurrent maxillary sinusitis - amoxicillin-clavulanate (AUGMENTIN) 875-125 MG tablet; Take 1 tablet by mouth 2 (two) times daily.  Dispense: 14 tablet; Refill: 0 - predniSONE (DELTASONE) 20 MG tablet; Take 2 tablets (40 mg total) by mouth daily with breakfast.  Dispense: 6 tablet; Refill: 0  Rx Augmentin.  Increase fluids.  Rest.  Saline nasal spray.  Probiotic.  Mucinex as directed.  Humidifier in bedroom. Prednisone 3-day burst giving throat pain and inflammation.  Call or return to clinic if symptoms are not improving.   Follow Up Instructions: I discussed the assessment and treatment plan with the patient. The patient was provided an opportunity to ask questions and all were answered. The patient agreed with the plan and demonstrated an understanding of the instructions.  A copy of instructions were sent to the patient via MyChart unless otherwise noted below.   The patient was advised to call back or seek an in-person evaluation if the symptoms worsen or if the condition fails to improve as anticipated.  Time:  I spent 10 minutes with the patient via telehealth technology discussing the above problems/concerns.    Piedad Climes, PA-C

## 2021-08-21 ENCOUNTER — Other Ambulatory Visit: Payer: Self-pay | Admitting: Nurse Practitioner

## 2021-08-21 ENCOUNTER — Telehealth: Payer: BC Managed Care – PPO | Admitting: Physician Assistant

## 2021-08-21 DIAGNOSIS — J9801 Acute bronchospasm: Secondary | ICD-10-CM

## 2021-08-21 MED ORDER — PROMETHAZINE-DM 6.25-15 MG/5ML PO SYRP
5.0000 mL | ORAL_SOLUTION | Freq: Four times a day (QID) | ORAL | 0 refills | Status: DC | PRN
Start: 2021-08-21 — End: 2021-08-28

## 2021-08-21 NOTE — Progress Notes (Signed)
Virtual Visit Consent   [name], you are scheduled for a virtual visit with a [facility] provider today.     Just as with appointments in the office, your consent must be obtained to participate.  Your consent will be active for this visit and any virtual visit you may have with one of our providers in the next 365 days.     If you have a MyChart account, a copy of this consent can be sent to you electronically.  All virtual visits are billed to your insurance company just like a traditional visit in the office.    As this is a virtual visit, video technology does not allow for your provider to perform a traditional examination.  This may limit your provider's ability to fully assess your condition.  If your provider identifies any concerns that need to be evaluated in person or the need to arrange testing (such as labs, EKG, etc.), we will make arrangements to do so.     Although advances in technology are sophisticated, we cannot ensure that it will always work on either your end or our end.  If the connection with a video visit is poor, the visit may have to be switched to a telephone visit.  With either a video or telephone visit, we are not always able to ensure that we have a secure connection.     I need to obtain your verbal consent now.   Are you willing to proceed with your visit today?    [name] has provided verbal consent on 08/21/2021 for a virtual visit (video or telephone).   Leeanne Rio, [place]   Date: 08/21/2021 11:40 AM   Virtual Visit via Video Note   I, Leeanne Rio, connected with  [name]  ([id number], 01-12-1971) on 08/21/21 at 11:30 AM EST by a video-enabled telemedicine application and verified that I am speaking with the correct person using two identifiers.  Location: Patient: Virtual Visit Location Patient: Home Provider: Virtual Visit Location Provider: Home Office   I discussed the limitations of evaluation and  management by telemedicine and the availability of in person appointments. The patient expressed understanding and agreed to proceed.    History of Present Illness: [name] is a 51 y.o. who identifies as a female who was assigned female at birth, and is being seen today for worsening of cough. Was evaluated yesterday for sinusitis and bronchospasm. Started on antibiotics and prednisone. She was encouraged to use her albuterol inhaler and her Tessalon Perles for cough. Notes cough seems to be more persistent now and causing her to gag it is so severe. Denies any other new or worsening symptoms. Is taking her medications as directed but is still coughing so much it is starting to cause stress incontinence in addition to preventing her from resting.  HPI: HPI  Problems:  Patient Active Problem List   Diagnosis Date Noted   GERD (gastroesophageal reflux disease) 08/29/2018   HLD (hyperlipidemia) 08/29/2018   Menopausal symptoms 08/30/2017   HYPERTENSION, BENIGN ESSENTIAL 01/26/2007    Allergies:  Allergies  Allergen Reactions   Benazepril Cough   Lisinopril Cough   Medications:  Current Outpatient Medications:    promethazine-dextromethorphan (PROMETHAZINE-DM) 6.25-15 MG/5ML syrup, Take 5 mLs by mouth 4 (four) times daily as needed for cough., Disp: 118 mL, Rfl: 0   amLODipine (NORVASC) 10 MG tablet, Take 1 tablet by mouth daily, Disp: 90 tablet, Rfl: 0   amoxicillin-clavulanate (AUGMENTIN)  875-125 MG tablet, Take 1 tablet by mouth 2 (two) times daily., Disp: 14 tablet, Rfl: 0   atorvastatin (LIPITOR) 10 MG tablet, TAKE 1 TABLET(10 MG) BY MOUTH DAILY AT 6 PM, Disp: 90 tablet, Rfl: 0   losartan (COZAAR) 25 MG tablet, TAKE 1 TABLET(25 MG) BY MOUTH DAILY, Disp: 90 tablet, Rfl: 0   omeprazole (PRILOSEC) 40 MG capsule, TAKE 1 CAPSULE(40 MG) BY MOUTH DAILY, Disp: 90 capsule, Rfl: 1   predniSONE (DELTASONE) 20 MG tablet, Take 2 tablets (40 mg total) by mouth daily with breakfast., Disp: 6  tablet, Rfl: 0   venlafaxine XR (EFFEXOR-XR) 150 MG 24 hr capsule, TAKE 1 CAPSULE(150 MG) BY MOUTH DAILY WITH BREAKFAST, Disp: 30 capsule, Rfl: 0  Observations/Objective: Patient is well-developed, well-nourished in no acute distress.  Resting comfortably at home.  Head is normocephalic, atraumatic.  No labored breathing. Speech is clear and coherent with logical content.  Patient is alert and oriented at baseline.   Assessment and Plan: 1. Cough due to bronchospasm - promethazine-dextromethorphan (PROMETHAZINE-DM) 6.25-15 MG/5ML syrup; Take 5 mLs by mouth 4 (four) times daily as needed for cough.  Dispense: 118 mL; Refill: 0  Will add on promethazine-DM syrup. She is to continue her albuterol, the prednisone burst. Can use Tessalon along with new cough syrup. If not calming things down she will require an in-person evaluation  Follow Up Instructions: I discussed the assessment and treatment plan with the patient. The patient was provided an opportunity to ask questions and all were answered. The patient agreed with the plan and demonstrated an understanding of the instructions.  A copy of instructions were sent to the patient via MyChart unless otherwise noted below.   The patient was advised to call back or seek an in-person evaluation if the symptoms worsen or if the condition fails to improve as anticipated.  Time:  I spent 5 minutes with the patient via telehealth technology discussing the above problems/concerns.    Leeanne Rio, PA-C

## 2021-08-21 NOTE — Patient Instructions (Signed)
°  [name], thank you for joining Piedad Climes, PA-C for today's virtual visit.  While this provider is not your primary care provider (PCP), if your PCP is located in our provider database this encounter information will be shared with them immediately following your visit.  Consent: (Patient) VANECIA LIMPERT provided verbal consent for this virtual visit at the beginning of the encounter.  Current Medications:  Current Outpatient Medications:    promethazine-dextromethorphan (PROMETHAZINE-DM) 6.25-15 MG/5ML syrup, Take 5 mLs by mouth 4 (four) times daily as needed for cough., Disp: 118 mL, Rfl: 0   amLODipine (NORVASC) 10 MG tablet, Take 1 tablet by mouth daily, Disp: 90 tablet, Rfl: 0   amoxicillin-clavulanate (AUGMENTIN) 875-125 MG tablet, Take 1 tablet by mouth 2 (two) times daily., Disp: 14 tablet, Rfl: 0   atorvastatin (LIPITOR) 10 MG tablet, TAKE 1 TABLET(10 MG) BY MOUTH DAILY AT 6 PM, Disp: 90 tablet, Rfl: 0   losartan (COZAAR) 25 MG tablet, TAKE 1 TABLET(25 MG) BY MOUTH DAILY, Disp: 90 tablet, Rfl: 0   omeprazole (PRILOSEC) 40 MG capsule, TAKE 1 CAPSULE(40 MG) BY MOUTH DAILY, Disp: 90 capsule, Rfl: 1   predniSONE (DELTASONE) 20 MG tablet, Take 2 tablets (40 mg total) by mouth daily with breakfast., Disp: 6 tablet, Rfl: 0   venlafaxine XR (EFFEXOR-XR) 150 MG 24 hr capsule, TAKE 1 CAPSULE(150 MG) BY MOUTH DAILY WITH BREAKFAST, Disp: 30 capsule, Rfl: 0   Medications ordered in this encounter:  Meds ordered this encounter  Medications   promethazine-dextromethorphan (PROMETHAZINE-DM) 6.25-15 MG/5ML syrup    Sig: Take 5 mLs by mouth 4 (four) times daily as needed for cough.    Dispense:  118 mL    Refill:  0    Order Specific Question:   Supervising Provider    Answer:   Hyacinth Meeker, BRIAN [[id number]]     *If you need refills on other medications prior to your next appointment, please contact your pharmacy*  Follow-Up: Call back or seek an in-person evaluation if the  symptoms worsen or if the condition fails to improve as anticipated.  Other Instructions   If you have been instructed to have an in-person evaluation today at a local Urgent Care facility, please use the link below. It will take you to a list of all of our available [facility], including address, phone number and hours of operation. Please do not delay care.  [facility]  If you or a family member do not have a primary care provider, use the link below to schedule a visit and establish care. When you choose a [facility] primary care physician or advanced practice provider, you gain a long-term partner in health. Find a Primary Care Provider  Learn more about [facility]'s in-office and virtual care options: [facility] - Get Care Now

## 2021-08-22 ENCOUNTER — Other Ambulatory Visit: Payer: Self-pay | Admitting: Internal Medicine

## 2021-08-22 NOTE — Telephone Encounter (Signed)
Medication: venlafaxine XR (EFFEXOR-XR) 150 MG 24 hr capsule [[id number]] - Apt scheduled 08/25/21  Pt is out of medication   Has the patient contacted their pharmacy? YES  (Agent: If no, request that the patient contact the pharmacy for the refill. If patient does not wish to contact the pharmacy document the reason why and proceed with request.) (Agent: If yes, when and what did the pharmacy advise?)  Preferred Pharmacy (with phone number or street name): [id number] - [place], [place] - [address] AT [facility] OF [address] [place] [facility] [address] Phone: [phone] Fax: [phone] Hours: Open 24 hours   Has the patient been seen for an appointment in the last year OR does the patient have an upcoming appointment? YES 08/2021  Agent: Please be advised that RX refills may take up to 3 business days. We ask that you follow-up with your pharmacy.

## 2021-08-22 NOTE — Telephone Encounter (Signed)
Requested medication (s) are due for refill today: Yes  Requested medication (s) are on the active medication list: Yes  Last refill:  05/28/21  Future visit scheduled: Yes  Notes to clinic:  Unable to refill per protocol due to failed labs, no updated results. Patient out of medication     Requested Prescriptions  Pending Prescriptions Disp Refills   venlafaxine XR (EFFEXOR-XR) 150 MG 24 hr capsule 30 capsule 0     Psychiatry: Antidepressants - SNRI - desvenlafaxine & venlafaxine Failed - 08/22/2021  4:56 PM      Failed - Cr in normal range and within 360 days    Creatinine, Ser  Date Value Ref Range Status  03/27/2020 0.64 0.40 - 1.20 mg/dL Final          Failed - Last BP in normal range    BP Readings from Last 1 Encounters:  12/03/20 (!) 148/97          Failed - Lipid Panel in normal range within the last 12 months    Cholesterol  Date Value Ref Range Status  03/27/2020 149 0 - 200 mg/dL Final    Comment:    ATP III Classification       Desirable:  < 200 mg/dL               Borderline High:  200 - 239 mg/dL          High:  > = 240 mg/dL   LDL Cholesterol  Date Value Ref Range Status  08/28/2016 75 0 - 99 mg/dL Final   Direct LDL  Date Value Ref Range Status  03/27/2020 60.0 mg/dL Final    Comment:    Optimal:  <100 mg/dLNear or Above Optimal:  100-129 mg/dLBorderline High:  130-159 mg/dLHigh:  160-189 mg/dLVery High:  >190 mg/dL   HDL  Date Value Ref Range Status  03/27/2020 55.20 >39.00 mg/dL Final   Triglycerides  Date Value Ref Range Status  03/27/2020 247.0 (H) 0.0 - 149.0 mg/dL Final    Comment:    Normal:  <150 mg/dLBorderline High:  150 - 199 mg/dL         Passed - Valid encounter within last 6 months    Recent Outpatient Visits           1 month ago Viral upper respiratory tract infection   [place], Coralie Keens, NP       Future Appointments             In 1 week Garnette Gunner, Coralie Keens, NP [facility],  [facility]

## 2021-08-25 ENCOUNTER — Telehealth: Payer: BC Managed Care – PPO | Admitting: Internal Medicine

## 2021-08-25 ENCOUNTER — Other Ambulatory Visit: Payer: Self-pay

## 2021-08-25 MED ORDER — VENLAFAXINE HCL ER 150 MG PO CP24: ORAL_CAPSULE | ORAL | 0 refills | Status: DC

## 2021-08-28 ENCOUNTER — Telehealth (INDEPENDENT_AMBULATORY_CARE_PROVIDER_SITE_OTHER): Payer: BC Managed Care – PPO | Admitting: Internal Medicine

## 2021-08-28 ENCOUNTER — Encounter: Payer: Self-pay | Admitting: Internal Medicine

## 2021-08-28 DIAGNOSIS — E782 Mixed hyperlipidemia: Secondary | ICD-10-CM

## 2021-08-28 DIAGNOSIS — K219 Gastro-esophageal reflux disease without esophagitis: Secondary | ICD-10-CM | POA: Diagnosis not present

## 2021-08-28 DIAGNOSIS — D649 Anemia, unspecified: Secondary | ICD-10-CM | POA: Insufficient documentation

## 2021-08-28 DIAGNOSIS — I10 Essential (primary) hypertension: Secondary | ICD-10-CM | POA: Diagnosis not present

## 2021-08-28 DIAGNOSIS — N951 Menopausal and female climacteric states: Secondary | ICD-10-CM | POA: Diagnosis not present

## 2021-08-28 DIAGNOSIS — D75839 Thrombocytosis, unspecified: Secondary | ICD-10-CM

## 2021-08-28 MED ORDER — CLONIDINE 0.1 MG/24HR TD PTWK: 0.1000 mg | MEDICATED_PATCH | TRANSDERMAL | 1 refills | Status: DC

## 2021-08-28 MED ORDER — PANTOPRAZOLE SODIUM 40 MG PO TBEC
40.0000 mg | DELAYED_RELEASE_TABLET | Freq: Every day | ORAL | 1 refills | Status: DC
Start: 2021-08-28 — End: 2022-07-03

## 2021-08-28 NOTE — Assessment & Plan Note (Signed)
We will check CBC with differential

## 2021-08-28 NOTE — Progress Notes (Signed)
Virtual Visit via Video Note  I connected with [name] on 08/28/21 at 11:20 AM EST by a video enabled telemedicine application and verified that I am speaking with the correct person using two identifiers.  Location: Patient: Home Provider: Office  Person's participating in this video call: Webb Silversmith, NP and Zacarias Pontes   I discussed the limitations of evaluation and management by telemedicine and the availability of in person appointments. The patient expressed understanding and agreed to proceed.  History of Present Illness:  Pt due for follow up of chronic conditions.  HTN: Her BP runs 130-140/80-90's. She is taking Losartan and Amlodipine as prescribed. ECG from 02/2017 reviewed.  HLD: Her last LDL was 60, triglycerides 247, 03/2020. She denies myalgias on Atorvastatin.  She does not consume a low-fat diet.  GERD: She is having breakthrough on Omeprazole.  Upper GI from 12/2007 reviewed.  Menopausal symptoms: Mainly hot flashes, mood swings and brain fog.  Managed on Venlafaxine which she does not feel is effective.  She has not taken this medication in 2 weeks.   Anemia: Her last H/H was 11.1/33.9, 03/2020.  She is not taking any oral iron at this time.  She does not follow with hematology.  Thrombocytosis: Her last platelet count was 438, 03/2020.  She does not follow with hematology.  Past Medical History:  Diagnosis Date   Allergy    Anxiety    GERD (gastroesophageal reflux disease)    Hyperlipidemia    Hypertension    PVC (premature ventricular contraction) 07/20/2016   Sinus tachycardia 07/20/2016    Current Outpatient Medications  Medication Sig Dispense Refill   amLODipine (NORVASC) 10 MG tablet Take 1 tablet by mouth daily 90 tablet 0   amoxicillin-clavulanate (AUGMENTIN) 875-125 MG tablet Take 1 tablet by mouth 2 (two) times daily. 14 tablet 0   atorvastatin (LIPITOR) 10 MG tablet TAKE 1 TABLET(10 MG) BY MOUTH DAILY AT 6 PM 90 tablet 0   losartan  (COZAAR) 25 MG tablet TAKE 1 TABLET(25 MG) BY MOUTH DAILY 90 tablet 0   omeprazole (PRILOSEC) 40 MG capsule TAKE 1 CAPSULE(40 MG) BY MOUTH DAILY 90 capsule 1   predniSONE (DELTASONE) 20 MG tablet Take 2 tablets (40 mg total) by mouth daily with breakfast. 6 tablet 0   promethazine-dextromethorphan (PROMETHAZINE-DM) 6.25-15 MG/5ML syrup Take 5 mLs by mouth 4 (four) times daily as needed for cough. 118 mL 0   venlafaxine XR (EFFEXOR-XR) 150 MG 24 hr capsule TAKE 1 CAPSULE(150 MG) BY MOUTH DAILY WITH BREAKFAST Strength: 150 mg 30 capsule 0   No current facility-administered medications for this visit.    Allergies  Allergen Reactions   Benazepril Cough   Lisinopril Cough    Family History  Problem Relation Age of Onset   Hypertension Mother    Heart murmur Father    Ovarian cancer Paternal Grandmother    Lupus Sister    Colon cancer Neg Hx    Esophageal cancer Neg Hx    Pancreatic cancer Neg Hx    Rectal cancer Neg Hx    Stomach cancer Neg Hx     Social History   Socioeconomic History   Marital status: Married    Spouse name: Not on file   Number of children: Not on file   Years of education: Not on file   Highest education level: Not on file  Occupational History   Not on file  Tobacco Use   Smoking status: Light Smoker    Packs/day: 0.30  Types: Cigarettes   Smokeless tobacco: Never   Tobacco comments:    black-n-milds   Substance and Sexual Activity   Alcohol use: Yes    Comment: occasional    Drug use: Yes    Types: Marijuana    Comment: occasional    Sexual activity: Yes    Birth control/protection: Surgical  Other Topics Concern   Not on file  Social History Narrative   Not on file   Social Determinants of Health   Financial Resource Strain: Not on file  Food Insecurity: Not on file  Transportation Needs: Not on file  Physical Activity: Not on file  Stress: Not on file  Social Connections: Not on file  Intimate Partner Violence: Not on file      Constitutional: Patient reports fatigue.  Denies fever, malaise, headache or abrupt weight changes.  Respiratory: Denies difficulty breathing, shortness of breath, cough or sputum production.   Cardiovascular: Denies chest pain, chest tightness, palpitations or swelling in the hands or feet.  Gastrointestinal: Patient reports reflux.  Denies abdominal pain, bloating, constipation, diarrhea or blood in the stool.  Skin: Denies redness, rashes, lesions or ulcercations.  Neurological: Patient reports hot flashes and brain fog.  Denies dizziness, difficulty with memory, difficulty with speech or problems with balance and coordination.  Psych: Patient reports mood swings.  Denies anxiety, depression, SI/HI.  No other specific complaints in a complete review of systems (except as listed in HPI above).  Observations/Objective:  Wt Readings from Last 3 Encounters:  12/03/20 140 lb (63.5 kg)  03/27/20 146 lb (66.2 kg)  08/29/18 140 lb (63.5 kg)    General: Appears her stated age, well developed, well nourished in NAD. HEENT: Head: normal shape and size; Eyes: EOMs intact;  Pulmonary/Chest: Normal effort. No respiratory distress.  Neurological: Alert and oriented.  Psychiatric: Mood and affect normal. Behavior is normal. Judgment and thought content normal.    BMET    Component Value Date/Time   NA 140 03/27/2020 1459   K 3.2 (L) 03/27/2020 1459   CL 99 03/27/2020 1459   CO2 30 03/27/2020 1459   GLUCOSE 88 03/27/2020 1459   BUN 11 03/27/2020 1459   CREATININE 0.64 03/27/2020 1459   CALCIUM 8.9 03/27/2020 1459   GFRNONAA >60 02/06/2017 0548   GFRAA >60 02/06/2017 0548    Lipid Panel     Component Value Date/Time   CHOL 149 03/27/2020 1459   TRIG 247.0 (H) 03/27/2020 1459   HDL 55.20 03/27/2020 1459   CHOLHDL 3 03/27/2020 1459   VLDL 49.4 (H) 03/27/2020 1459   LDLCALC 75 08/28/2016 1016    CBC    Component Value Date/Time   WBC 12.9 (H) 03/27/2020 1459   RBC 3.76  (L) 03/27/2020 1459   HGB 11.1 (L) 03/27/2020 1459   HGB 12.3 05/10/2007 1305   HCT 33.9 (L) 03/27/2020 1459   HCT 35.8 05/10/2007 1305   PLT 483.0 (H) 03/27/2020 1459   PLT 515 (H) 05/10/2007 1305   MCV 90.2 03/27/2020 1459   MCV 82.8 05/10/2007 1305   MCH 29.5 02/06/2017 0548   MCHC 32.9 03/27/2020 1459   RDW 15.9 (H) 03/27/2020 1459   RDW 15.1 (H) 05/10/2007 1305   LYMPHSABS 3.1 04/13/2017 1449   LYMPHSABS 2.4 05/10/2007 1305   MONOABS 0.6 04/13/2017 1449   MONOABS 0.4 05/10/2007 1305   EOSABS 0.1 04/13/2017 1449   EOSABS 0.1 05/10/2007 1305   BASOSABS 0.1 04/13/2017 1449   BASOSABS 0.0 05/10/2007 1305  Hgb A1C No results found for: HGBA1C     Assessment and Plan:   Follow Up Instructions:    I discussed the assessment and treatment plan with the patient. The patient was provided an opportunity to ask questions and all were answered. The patient agreed with the plan and demonstrated an understanding of the instructions.   The patient was advised to call back or seek an in-person evaluation if the symptoms worsen or if the condition fails to improve as anticipated.   Webb Silversmith, NP

## 2021-08-28 NOTE — Assessment & Plan Note (Signed)
We will check CBC with differential and iron panel today

## 2021-08-28 NOTE — Patient Instructions (Signed)

## 2021-08-28 NOTE — Assessment & Plan Note (Signed)
C-Met and lipid profile today Encouraged her to consume a low-fat diet Continue Atorvastatin 

## 2021-08-28 NOTE — Assessment & Plan Note (Signed)
Has been slightly elevated °Continue Losartan and Amlodipine °We will add clonidine patch for menopausal symptoms °Reinforced DASH diet and exercise for weight loss °C-Met today °

## 2021-08-28 NOTE — Assessment & Plan Note (Signed)
Deteriorated We will D/C Venlafaxine that she has already stopped taking this We will start Clonidine patch

## 2021-08-28 NOTE — Assessment & Plan Note (Signed)
Deteriorated We will D/C Omeprazole and start Pantoprazole Try to avoid foods that trigger reflux

## 2021-08-29 ENCOUNTER — Ambulatory Visit: Payer: BC Managed Care – PPO | Admitting: Internal Medicine

## 2021-10-06 ENCOUNTER — Encounter: Payer: Self-pay | Admitting: Internal Medicine

## 2021-10-06 ENCOUNTER — Other Ambulatory Visit: Payer: Self-pay | Admitting: Nurse Practitioner

## 2021-10-07 ENCOUNTER — Other Ambulatory Visit: Payer: Self-pay | Admitting: Internal Medicine

## 2021-10-07 MED ORDER — ATORVASTATIN CALCIUM 10 MG PO TABS
ORAL_TABLET | ORAL | 0 refills | Status: DC
Start: 2021-10-07 — End: 2021-11-05

## 2021-10-07 MED ORDER — AMLODIPINE BESYLATE 10 MG PO TABS: ORAL_TABLET | ORAL | 0 refills | Status: DC

## 2021-10-07 NOTE — Telephone Encounter (Signed)
Pt advised.  She states will come in on Friday to get her blood work.  (10/10/2021). ? ?Thanks,  ? ?-Vernona Rieger  ?

## 2021-10-07 NOTE — Telephone Encounter (Signed)
Pt did video visit with Nicki Reaper NP on 09/25/21. Sending refill request to Pamala Hurry NP. ?

## 2021-10-07 NOTE — Telephone Encounter (Signed)
She never got her bloodwork done. Ok to refill x 30 days but she needs to get this done ?

## 2021-10-07 NOTE — Telephone Encounter (Signed)
Advised pt that she needs to also continue with amlodipine.  ? ?Thanks,  ? ?-Mickel Baas  ?

## 2021-10-08 NOTE — Telephone Encounter (Signed)
Requested medication (s) are due for refill today:   Yes for both ? ?Requested medication (s) are on the active medication list:   Yes for both ? ?Future visit scheduled:   No ? ? ?Last ordered: 10/07/2021 #30, 0 refills for both.   A 90 day supply is being requested.  ? ?Requested Prescriptions  ?Pending Prescriptions Disp Refills  ? atorvastatin (LIPITOR) 10 MG tablet [Pharmacy Med Name: ATORVASTATIN 10MG  TABLETS] 90 tablet   ?  Sig: TAKE 1 TABLET(10 MG) BY MOUTH DAILY AT 6 PM  ?  ? There is no refill protocol information for this order  ?  ? amLODipine (NORVASC) 10 MG tablet [Pharmacy Med Name: AMLODIPINE BESYLATE 10MG  TABLETS] 90 tablet   ?  Sig: Take 1 tablet by mouth daily  ?  ? There is no refill protocol information for this order  ?  ? ?

## 2021-10-09 NOTE — Telephone Encounter (Signed)
I am not filling her medications for 90 days until she comes in to get her labs done ?

## 2021-10-21 ENCOUNTER — Other Ambulatory Visit: Payer: Self-pay | Admitting: Nurse Practitioner

## 2021-10-22 MED ORDER — LOSARTAN POTASSIUM 25 MG PO TABS: ORAL_TABLET | ORAL | 0 refills | Status: DC

## 2021-11-05 ENCOUNTER — Other Ambulatory Visit: Payer: Self-pay | Admitting: Internal Medicine

## 2021-11-05 NOTE — Telephone Encounter (Signed)
Receipt confirmed by pharmacy 11/05/21 at 12:55 ? ?Requested Prescriptions  ?Pending Prescriptions Disp Refills  ?? amLODipine (NORVASC) 10 MG tablet [Pharmacy Med Name: AMLODIPINE BESYLATE 10MG  TABLETS] 90 tablet   ?  Sig: TAKE 1 TABLET BY MOUTH DAILY  ?  ? Cardiovascular: Calcium Channel Blockers 2 Failed - 11/05/2021 12:59 PM  ?  ?  Failed - Last BP in normal range  ?  BP Readings from Last 1 Encounters:  ?12/03/20 (!) 148/97  ?   ?  ?  Passed - Last Heart Rate in normal range  ?  Pulse Readings from Last 1 Encounters:  ?12/03/20 83  ?   ?  ?  Passed - Valid encounter within last 6 months  ?  Recent Outpatient Visits   ?      ? 2 months ago HYPERTENSION, BENIGN ESSENTIAL  ? [facility] [place], [place] W, NP  ? 4 months ago Viral upper respiratory tract infection  ? [facility] [place], [place] W, NP  ?  ?  ? ?  ?  ?  ?? atorvastatin (LIPITOR) 10 MG tablet [Pharmacy Med Name: ATORVASTATIN 10MG  TABLETS] 90 tablet   ?  Sig: TAKE 1 TABLET(10 MG) BY MOUTH DAILY AT 6 PM  ?  ? Cardiovascular:  Antilipid - Statins Failed - 11/05/2021 12:59 PM  ?  ?  Failed - Lipid Panel in normal range within the last 12 months  ?  Cholesterol  ?Date Value Ref Range Status  ?03/27/2020 149 0 - 200 mg/dL Final  ?  Comment:  ?  ATP III Classification       Desirable:  < 200 mg/dL               Borderline High:  200 - 239 mg/dL          High:  > = 240 mg/dL  ? ?LDL Cholesterol  ?Date Value Ref Range Status  ?08/28/2016 75 0 - 99 mg/dL Final  ? ?Direct LDL  ?Date Value Ref Range Status  ?03/27/2020 60.0 mg/dL Final  ?  Comment:  ?  Optimal:  <100 mg/dLNear or Above Optimal:  100-129 mg/dLBorderline High:  130-159 mg/dLHigh:  160-189 mg/dLVery High:  >190 mg/dL  ? ?HDL  ?Date Value Ref Range Status  ?03/27/2020 55.20 >39.00 mg/dL Final  ? ?Triglycerides  ?Date Value Ref Range Status  ?03/27/2020 247.0 (H) 0.0 - 149.0 mg/dL Final  ?  Comment:  ?  Normal:  <150 mg/dLBorderline High:  150 - 199 mg/dL  ? ?  ?  ?  Passed -  Patient is not pregnant  ?  ?  Passed - Valid encounter within last 12 months  ?  Recent Outpatient Visits   ?      ? 2 months ago HYPERTENSION, BENIGN ESSENTIAL  ? [facility] [place], [place] W, NP  ? 4 months ago Viral upper respiratory tract infection  ? [facility] [place], Coralie Ke[place], NP  ?  ?  ? ?  ?  ?  ? ? ?

## 2021-11-05 NOTE — Telephone Encounter (Signed)
Requested Prescriptions  ?Pending Prescriptions Disp Refills  ?? atorvastatin (LIPITOR) 10 MG tablet [Pharmacy Med Name: ATORVASTATIN 10MG  TABLETS] 30 tablet 0  ?  Sig: TAKE 1 TABLET(10 MG) BY MOUTH DAILY AT 6 PM  ?  ? Cardiovascular:  Antilipid - Statins Failed - 11/05/2021 12:51 PM  ?  ?  Failed - Lipid Panel in normal range within the last 12 months  ?  Cholesterol  ?Date Value Ref Range Status  ?03/27/2020 149 0 - 200 mg/dL Final  ?  Comment:  ?  ATP III Classification       Desirable:  < 200 mg/dL               Borderline High:  200 - 239 mg/dL          High:  > = 03/29/2020 mg/dL  ? ?LDL Cholesterol  ?Date Value Ref Range Status  ?08/28/2016 75 0 - 99 mg/dL Final  ? ?Direct LDL  ?Date Value Ref Range Status  ?03/27/2020 60.0 mg/dL Final  ?  Comment:  ?  Optimal:  <100 mg/dLNear or Above Optimal:  100-129 mg/dLBorderline High:  130-159 mg/dLHigh:  160-189 mg/dLVery High:  >190 mg/dL  ? ?HDL  ?Date Value Ref Range Status  ?03/27/2020 55.20 >39.00 mg/dL Final  ? ?Triglycerides  ?Date Value Ref Range Status  ?03/27/2020 247.0 (H) 0.0 - 149.0 mg/dL Final  ?  Comment:  ?  Normal:  <150 mg/dLBorderline High:  150 - 199 mg/dL  ? ?  ?  ?  Passed - Patient is not pregnant  ?  ?  Passed - Valid encounter within last 12 months  ?  Recent Outpatient Visits   ?      ? 2 months ago HYPERTENSION, BENIGN ESSENTIAL  ? [facility] [place], [place], NP  ? 4 months ago Viral upper respiratory tract infection  ? [facility] [place], [place], NP  ?  ?  ? ?  ?  ?  ?? amLODipine (NORVASC) 10 MG tablet [Pharmacy Med Name: AMLODIPINE BESYLATE 10MG  TABLETS] 30 tablet 0  ?  Sig: TAKE 1 TABLET BY MOUTH DAILY  ?  ? Cardiovascular: Calcium Channel Blockers 2 Failed - 11/05/2021 12:51 PM  ?  ?  Failed - Last BP in normal range  ?  BP Readings from Last 1 Encounters:  ?12/03/20 (!) 148/97  ?   ?  ?  Passed - Last Heart Rate in normal range  ?  Pulse Readings from Last 1 Encounters:  ?12/03/20 83  ?   ?  ?  Passed - Valid  encounter within last 6 months  ?  Recent Outpatient Visits   ?      ? 2 months ago HYPERTENSION, BENIGN ESSENTIAL  ? [facility] [place], [facility] W, NP  ? 4 months ago Viral upper respiratory tract infection  ? [facility] [place], [facility], NP  ?  ?  ? ?  ?  ?  ? ?

## 2021-12-05 ENCOUNTER — Other Ambulatory Visit: Payer: Self-pay | Admitting: Internal Medicine

## 2021-12-05 NOTE — Telephone Encounter (Signed)
Requested medication (s) are due for refill today: yes  Requested medication (s) are on the active medication list: yes  Last refill:  11/05/21 #30/0  Future visit scheduled: no  Notes to clinic:  Unable to refill per protocol due to failed labs, no updated results.    Requested Prescriptions  Pending Prescriptions Disp Refills   atorvastatin (LIPITOR) 10 MG tablet [Pharmacy Med Name: ATORVASTATIN 10MG  TABLETS] 30 tablet 0    Sig: TAKE 1 TABLET(10 MG) BY MOUTH DAILY AT 6 PM     Cardiovascular:  Antilipid - Statins Failed - 12/05/2021  6:21 AM      Failed - Lipid Panel in normal range within the last 12 months    Cholesterol  Date Value Ref Range Status  03/27/2020 149 0 - 200 mg/dL Final    Comment:    ATP III Classification       Desirable:  < 200 mg/dL               Borderline High:  200 - 239 mg/dL          High:  > = 03/29/2020 mg/dL   LDL Cholesterol  Date Value Ref Range Status  08/28/2016 75 0 - 99 mg/dL Final   Direct LDL  Date Value Ref Range Status  03/27/2020 60.0 mg/dL Final    Comment:    Optimal:  <100 mg/dLNear or Above Optimal:  100-129 mg/dLBorderline High:  130-159 mg/dLHigh:  160-189 mg/dLVery High:  >190 mg/dL   HDL  Date Value Ref Range Status  03/27/2020 55.20 >39.00 mg/dL Final   Triglycerides  Date Value Ref Range Status  03/27/2020 247.0 (H) 0.0 - 149.0 mg/dL Final    Comment:    Normal:  <150 mg/dLBorderline High:  150 - 199 mg/dL         Passed - Patient is not pregnant      Passed - Valid encounter within last 12 months    Recent Outpatient Visits           3 months ago HYPERTENSION, BENIGN ESSENTIAL   [facility], [place], NP   5 months ago Viral upper respiratory tract infection   [facility] [place], [place], NP               Signed Prescriptions Disp Refills   amLODipine (NORVASC) 10 MG tablet 90 tablet 0    Sig: TAKE 1 TABLET BY MOUTH DAILY     Cardiovascular: Calcium Channel Blockers 2  Failed - 12/05/2021  6:21 AM      Failed - Last BP in normal range    BP Readings from Last 1 Encounters:  12/03/20 (!) 148/97         Passed - Last Heart Rate in normal range    Pulse Readings from Last 1 Encounters:  12/03/20 83         Passed - Valid encounter within last 6 months    Recent Outpatient Visits           3 months ago HYPERTENSION, BENIGN ESSENTIAL   [facility] [place], [place], NP   5 months ago Viral upper respiratory tract infection   [facility] [place], [place], NP

## 2021-12-05 NOTE — Telephone Encounter (Signed)
Requested Prescriptions  Pending Prescriptions Disp Refills  . amLODipine (NORVASC) 10 MG tablet [Pharmacy Med Name: AMLODIPINE BESYLATE 10MG  TABLETS] 90 tablet 0    Sig: TAKE 1 TABLET BY MOUTH DAILY     Cardiovascular: Calcium Channel Blockers 2 Failed - 12/05/2021  6:21 AM      Failed - Last BP in normal range    BP Readings from Last 1 Encounters:  12/03/20 (!) 148/97         Passed - Last Heart Rate in normal range    Pulse Readings from Last 1 Encounters:  12/03/20 83         Passed - Valid encounter within last 6 months    Recent Outpatient Visits          3 months ago HYPERTENSION, BENIGN ESSENTIAL   [facility] [place], [place], NP   5 months ago Viral upper respiratory tract infection   [facility] [place], [place], NP             . atorvastatin (LIPITOR) 10 MG tablet [Pharmacy Med Name: ATORVASTATIN 10MG  TABLETS] 30 tablet 0    Sig: TAKE 1 TABLET(10 MG) BY MOUTH DAILY AT 6 PM     Cardiovascular:  Antilipid - Statins Failed - 12/05/2021  6:21 AM      Failed - Lipid Panel in normal range within the last 12 months    Cholesterol  Date Value Ref Range Status  03/27/2020 149 0 - 200 mg/dL Final    Comment:    ATP III Classification       Desirable:  < 200 mg/dL               Borderline High:  200 - 239 mg/dL          High:  > = 02/04/2022 mg/dL   LDL Cholesterol  Date Value Ref Range Status  08/28/2016 75 0 - 99 mg/dL Final   Direct LDL  Date Value Ref Range Status  03/27/2020 60.0 mg/dL Final    Comment:    Optimal:  <100 mg/dLNear or Above Optimal:  100-129 mg/dLBorderline High:  130-159 mg/dLHigh:  160-189 mg/dLVery High:  >190 mg/dL   HDL  Date Value Ref Range Status  03/27/2020 55.20 >39.00 mg/dL Final   Triglycerides  Date Value Ref Range Status  03/27/2020 247.0 (H) 0.0 - 149.0 mg/dL Final    Comment:    Normal:  <150 mg/dLBorderline High:  150 - 199 mg/dL         Passed - Patient is not pregnant      Passed - Valid  encounter within last 12 months    Recent Outpatient Visits          3 months ago HYPERTENSION, BENIGN ESSENTIAL   [facility] [place], [facility], NP   5 months ago Viral upper respiratory tract infection   [facility] [employer], [facility], NP

## 2022-01-28 ENCOUNTER — Other Ambulatory Visit: Payer: Self-pay | Admitting: Internal Medicine

## 2022-01-29 NOTE — Telephone Encounter (Signed)
Requested medication (s) are due for refill today: yes  Requested medication (s) are on the active medication list: yes  Last refill:  10/22/21  Future visit scheduled: yes  Notes to clinic:  Unable to refill per protocol, no protocol attached. Routing for review.     Requested Prescriptions  Pending Prescriptions Disp Refills   losartan (COZAAR) 25 MG tablet [Pharmacy Med Name: LOSARTAN 25MG  TABLETS] 90 tablet 0    Sig: TAKE 1 TABLET(25 MG) BY MOUTH DAILY     There is no refill protocol information for this order

## 2022-02-09 ENCOUNTER — Telehealth: Payer: Self-pay | Admitting: *Deleted

## 2022-02-09 NOTE — Telephone Encounter (Signed)
Patient no show/answer PV appointment for today. I called patient, no answer, left a message for the patient to call us back today before 5 pm or the PV and procedure will be cancelled.  

## 2022-02-09 NOTE — Telephone Encounter (Signed)
Patient was rescheduled for tomorrow at 4:30.

## 2022-02-16 ENCOUNTER — Encounter: Payer: Self-pay | Admitting: Internal Medicine

## 2022-02-24 ENCOUNTER — Encounter: Payer: Self-pay | Admitting: Internal Medicine

## 2022-02-26 ENCOUNTER — Other Ambulatory Visit: Payer: Self-pay | Admitting: Internal Medicine

## 2022-02-26 ENCOUNTER — Encounter: Payer: BC Managed Care – PPO | Admitting: Internal Medicine

## 2022-02-26 LAB — HM MAMMOGRAPHY

## 2022-02-26 NOTE — Progress Notes (Deleted)
Subjective:    Patient ID: [name], female    DOB: [birth year], 51 y.o.   MRN: [id number]  HPI  Patient presents to clinic today for her annual exam.  Flu: 03/2020 Tetanus: 08/2018 COVID: [place] x2 Shingrix: Never Pap smear: Hysterectomy Mammogram: 02/2022 Colon screening: Vision screen: Dentist:  Diet: Exercise:  Review of Systems     Past Medical History:  Diagnosis Date   Allergy    Anxiety    GERD (gastroesophageal reflux disease)    Hyperlipidemia    Hypertension    PVC (premature ventricular contraction) 07/20/2016   Sinus tachycardia 07/20/2016    Current Outpatient Medications  Medication Sig Dispense Refill   amLODipine (NORVASC) 10 MG tablet TAKE 1 TABLET BY MOUTH DAILY 90 tablet 0   atorvastatin (LIPITOR) 10 MG tablet TAKE 1 TABLET(10 MG) BY MOUTH DAILY AT 6 PM 90 tablet 0   cloNIDine (CATAPRES - DOSED IN MG/24 HR) 0.1 mg/24hr patch Place 1 patch (0.1 mg total) onto the skin once a week. 12 patch 1   losartan (COZAAR) 25 MG tablet TAKE 1 TABLET(25 MG) BY MOUTH DAILY 30 tablet 0   pantoprazole (PROTONIX) 40 MG tablet Take 1 tablet (40 mg total) by mouth daily. 90 tablet 1   No current facility-administered medications for this visit.    Allergies  Allergen Reactions   Benazepril Cough   Lisinopril Cough    Family History  Problem Relation Age of Onset   Hypertension Mother    Heart murmur Father    Ovarian cancer Paternal Grandmother    Lupus Sister    Colon cancer Neg Hx    Esophageal cancer Neg Hx    Pancreatic cancer Neg Hx    Rectal cancer Neg Hx    Stomach cancer Neg Hx     Social History   Socioeconomic History   Marital status: Married    Spouse name: Not on file   Number of children: Not on file   Years of education: Not on file   Highest education level: Not on file  Occupational History   Not on file  Tobacco Use   Smoking status: Light Smoker    Packs/day: 0.30    Types: Cigarettes   Smokeless tobacco: Never    Tobacco comments:    black-n-milds   Substance and Sexual Activity   Alcohol use: Yes    Comment: occasional    Drug use: Yes    Types: Marijuana    Comment: occasional    Sexual activity: Yes    Birth control/protection: Surgical  Other Topics Concern   Not on file  Social History Narrative   Not on file   Social Determinants of Health   Financial Resource Strain: Not on file  Food Insecurity: Not on file  Transportation Needs: Not on file  Physical Activity: Not on file  Stress: Not on file  Social Connections: Not on file  Intimate Partner Violence: Not on file     Constitutional: Denies fever, malaise, fatigue, headache or abrupt weight changes.  HEENT: Denies eye pain, eye redness, ear pain, ringing in the ears, wax buildup, runny nose, nasal congestion, bloody nose, or sore throat. Respiratory: Denies difficulty breathing, shortness of breath, cough or sputum production.   Cardiovascular: Denies chest pain, chest tightness, palpitations or swelling in the hands or feet.  Gastrointestinal: Denies abdominal pain, bloating, constipation, diarrhea or blood in the stool.  GU: Denies urgency, frequency, pain with urination, burning sensation, blood in urine, odor  or discharge. Musculoskeletal: Denies decrease in range of motion, difficulty with gait, muscle pain or joint pain and swelling.  Skin: Denies redness, rashes, lesions or ulcercations.  Neurological: Denies dizziness, difficulty with memory, difficulty with speech or problems with balance and coordination.  Psych: Denies anxiety, depression, SI/HI.  No other specific complaints in a complete review of systems (except as listed in HPI above).  Objective:   Physical Exam   There were no vitals taken for this visit. Wt Readings from Last 3 Encounters:  12/03/20 140 lb (63.5 kg)  03/27/20 146 lb (66.2 kg)  08/29/18 140 lb (63.5 kg)    General: Appears their stated age, well developed, well nourished in  NAD. Skin: Warm, dry and intact. No rashes, lesions or ulcerations noted. HEENT: Head: normal shape and size; Eyes: sclera white, no icterus, conjunctiva pink, PERRLA and EOMs intact; Ears: Tm's gray and intact, normal light reflex; Nose: mucosa pink and moist, septum midline; Throat/Mouth: Teeth present, mucosa pink and moist, no exudate, lesions or ulcerations noted.  Neck:  Neck supple, trachea midline. No masses, lumps or thyromegaly present.  Cardiovascular: Normal rate and rhythm. S1,S2 noted.  No murmur, rubs or gallops noted. No JVD or BLE edema. No carotid bruits noted. Pulmonary/Chest: Normal effort and positive vesicular breath sounds. No respiratory distress. No wheezes, rales or ronchi noted.  Abdomen: Soft and nontender. Normal bowel sounds. No distention or masses noted. Liver, spleen and kidneys non palpable. Musculoskeletal: Normal range of motion. No signs of joint swelling. No difficulty with gait.  Neurological: Alert and oriented. Cranial nerves II-XII grossly intact. Coordination normal.  Psychiatric: Mood and affect normal. Behavior is normal. Judgment and thought content normal.     BMET    Component Value Date/Time   NA 140 03/27/2020 1459   K 3.2 (L) 03/27/2020 1459   CL 99 03/27/2020 1459   CO2 30 03/27/2020 1459   GLUCOSE 88 03/27/2020 1459   BUN 11 03/27/2020 1459   CREATININE 0.64 03/27/2020 1459   CALCIUM 8.9 03/27/2020 1459   GFRNONAA >60 02/06/2017 0548   GFRAA >60 02/06/2017 0548    Lipid Panel     Component Value Date/Time   CHOL 149 03/27/2020 1459   TRIG 247.0 (H) 03/27/2020 1459   HDL 55.20 03/27/2020 1459   CHOLHDL 3 03/27/2020 1459   VLDL 49.4 (H) 03/27/2020 1459   LDLCALC 75 08/28/2016 1016    CBC    Component Value Date/Time   WBC 12.9 (H) 03/27/2020 1459   RBC 3.76 (L) 03/27/2020 1459   HGB 11.1 (L) 03/27/2020 1459   HGB 12.3 05/10/2007 1305   HCT 33.9 (L) 03/27/2020 1459   HCT 35.8 05/10/2007 1305   PLT 483.0 (H) 03/27/2020  1459   PLT 515 (H) 05/10/2007 1305   MCV 90.2 03/27/2020 1459   MCV 82.8 05/10/2007 1305   MCH 29.5 02/06/2017 0548   MCHC 32.9 03/27/2020 1459   RDW 15.9 (H) 03/27/2020 1459   RDW 15.1 (H) 05/10/2007 1305   LYMPHSABS 3.1 04/13/2017 1449   LYMPHSABS 2.4 05/10/2007 1305   MONOABS 0.6 04/13/2017 1449   MONOABS 0.4 05/10/2007 1305   EOSABS 0.1 04/13/2017 1449   EOSABS 0.1 05/10/2007 1305   BASOSABS 0.1 04/13/2017 1449   BASOSABS 0.0 05/10/2007 1305    Hgb A1C No results found for: "HGBA1C"         Assessment & Plan:   Preventative Health Maintenance:  Flu shot today Tetanus UTD Encouraged her to get her  COVID-booster Discussed Shingrix vaccine, she will check coverage with her insurance company and schedule a nurse visit if she would like to have this done She no longer needs Pap smears Mammogram UTD Referral to GI for screening colonoscopy Encouraged her to consume a balanced diet and exercise regimen Advised to see an eye doctor and dentist annually We will check CBC, c-Met, lipid and hep C today  RTC in 6 months, follow-up chronic conditions Webb Silversmith, NP

## 2022-02-26 NOTE — Telephone Encounter (Signed)
Requested Prescriptions  Pending Prescriptions Disp Refills  . cloNIDine (CATAPRES - DOSED IN MG/24 HR) 0.1 mg/24hr patch [Pharmacy Med Name: CLONIDINE 0.1MG /24H WEEKLY PATCH] 12 patch 0    Sig: APPLY 1 PATCH(0.1 MG) TOPICALLY TO THE SKIN 1 TIME A WEEK     Cardiovascular:  Alpha-2 Agonists Failed - 02/26/2022 10:33 AM      Failed - Last BP in normal range    BP Readings from Last 1 Encounters:  12/03/20 (!) 148/97         Failed - Valid encounter within last 6 months    Recent Outpatient Visits          6 months ago HYPERTENSION, BENIGN ESSENTIAL   [facility] [place], Salvadore Oxford, NP   7 months ago Viral upper respiratory tract infection   [facility] [place], Salvadore Oxford, NP      Future Appointments            Today Lorre Munroe, NP [facility]           Passed - Last Heart Rate in normal range    Pulse Readings from Last 1 Encounters:  12/03/20 83

## 2022-02-27 ENCOUNTER — Encounter: Payer: BC Managed Care – PPO | Admitting: Gastroenterology

## 2022-03-11 ENCOUNTER — Encounter: Payer: BC Managed Care – PPO | Admitting: Gastroenterology

## 2022-03-25 ENCOUNTER — Other Ambulatory Visit: Payer: Self-pay | Admitting: Internal Medicine

## 2022-03-25 ENCOUNTER — Telehealth: Payer: Self-pay

## 2022-03-25 NOTE — Telephone Encounter (Signed)
Requested medication (s) are due for refill today: yes  Requested medication (s) are on the active medication list: yes  Last refill:  12/05/21  Future visit scheduled: no  Notes to clinic:  called pt but mailbox full. Sent pt a MyChart message to please call office for CPE (pt canceled CPE in August)   Requested Prescriptions  Pending Prescriptions Disp Refills   amLODipine (NORVASC) 10 MG tablet [Pharmacy Med Name: AMLODIPINE BESYLATE 10MG  TABLETS] 90 tablet 0    Sig: TAKE 1 TABLET BY MOUTH DAILY     Cardiovascular: Calcium Channel Blockers 2 Failed - 03/25/2022  4:52 AM      Failed - Last BP in normal range    BP Readings from Last 1 Encounters:  12/03/20 (!) 148/97         Failed - Valid encounter within last 6 months    Recent Outpatient Visits           6 months ago HYPERTENSION, [facility] [place], [place], [place]   8 months ago Viral upper respiratory tract infection   [facility] [place], Coralie Keens, NP              Passed - Last Heart Rate in normal range    Pulse Readings from Last 1 Encounters:  12/03/20 83          atorvastatin (LIPITOR) 10 MG tablet [Pharmacy Med Name: ATORVASTATIN 10MG  TABLETS] 90 tablet 0    Sig: TAKE 1 TABLET(10 MG) BY MOUTH DAILY AT 6 PM     Cardiovascular:  Antilipid - Statins Failed - 03/25/2022  4:52 AM      Failed - Lipid Panel in normal range within the last 12 months    Cholesterol  Date Value Ref Range Status  03/27/2020 149 0 - 200 mg/dL Final    Comment:    ATP III Classification       Desirable:  < 200 mg/dL               Borderline High:  200 - 239 mg/dL          High:  > = 240 mg/dL   LDL Cholesterol  Date Value Ref Range Status  08/28/2016 75 0 - 99 mg/dL Final   Direct LDL  Date Value Ref Range Status  03/27/2020 60.0 mg/dL Final    Comment:    Optimal:  <100 mg/dLNear or Above Optimal:  100-129 mg/dLBorderline High:  130-159 mg/dLHigh:  160-189 mg/dLVery High:  >190  mg/dL   HDL  Date Value Ref Range Status  03/27/2020 55.20 >39.00 mg/dL Final   Triglycerides  Date Value Ref Range Status  03/27/2020 247.0 (H) 0.0 - 149.0 mg/dL Final    Comment:    Normal:  <150 mg/dLBorderline High:  150 - 199 mg/dL         Passed - Patient is not pregnant      Passed - Valid encounter within last 12 months    Recent Outpatient Visits           6 months ago HYPERTENSION, [facility] [place], Coralie Keens, NP   8 months ago Viral upper respiratory tract infection   [place], Coralie Keens, NP

## 2022-03-25 NOTE — Telephone Encounter (Signed)
Patient no show pre-visit called patient no answer voice mail full unable to leave message Will call later

## 2022-03-25 NOTE — Telephone Encounter (Signed)
Called patient no answer voice mail full unable to leave message.  Colonoscopy and Pre visit cancelled following protocol.  No show letter mailed to patient

## 2022-04-08 ENCOUNTER — Encounter: Payer: BC Managed Care – PPO | Admitting: Gastroenterology

## 2022-05-26 ENCOUNTER — Other Ambulatory Visit: Payer: Self-pay | Admitting: Internal Medicine

## 2022-05-27 NOTE — Telephone Encounter (Signed)
Requested medication (s) are due for refill today:   Provider to review  Requested medication (s) are on the active medication list:   Yes  Future visit scheduled:   No   Last ordered: 10/07/2021 by Mordecai Maes, NP with [facility]  Returned because it does not look like this pt has been seen at [facility].   She has cancelled multiple appts and been a No Show for several appts. With [place].     Requested Prescriptions  Pending Prescriptions Disp Refills   losartan (COZAAR) 25 MG tablet [Pharmacy Med Name: LOSARTAN 25MG  TABLETS] 30 tablet 0    Sig: TAKE 1 TABLET(25 MG) BY MOUTH DAILY     There is no refill protocol information for this order     pantoprazole (PROTONIX) 40 MG tablet [Pharmacy Med Name: PANTOPRAZOLE 40MG  TABLETS] 90 tablet 1    Sig: TAKE 1 TABLET(40 MG) BY MOUTH DAILY     There is no refill protocol information for this order

## 2022-06-12 ENCOUNTER — Other Ambulatory Visit: Payer: Self-pay | Admitting: Internal Medicine

## 2022-06-12 ENCOUNTER — Telehealth: Payer: BC Managed Care – PPO | Admitting: Physician Assistant

## 2022-06-12 ENCOUNTER — Encounter: Payer: Self-pay | Admitting: Internal Medicine

## 2022-06-12 NOTE — Progress Notes (Signed)
The patient no-showed for appointment despite this provider sending direct link x 2 with no response and waiting for at least 10 minutes from appointment time for patient to join. They will be marked as a NS for this appointment/time.   Paulita Licklider M Oliviarose Punch, PA-C    

## 2022-06-12 NOTE — Telephone Encounter (Signed)
Nicki Reaper NP sent note back to me refusing refill and pt  needed to schedule appt. I spoke with pt and she had already made appt with Pamala Hurry NP by my chart. Nothing further needed at this time.

## 2022-06-12 NOTE — Telephone Encounter (Signed)
Pt had video visit with Pamala Hurry NP on 08/28/2021. Will send med refill request to Pamala Hurry NP.

## 2022-06-15 ENCOUNTER — Encounter: Payer: Self-pay | Admitting: Internal Medicine

## 2022-06-15 ENCOUNTER — Telehealth (INDEPENDENT_AMBULATORY_CARE_PROVIDER_SITE_OTHER): Payer: BC Managed Care – PPO | Admitting: Internal Medicine

## 2022-06-15 DIAGNOSIS — J069 Acute upper respiratory infection, unspecified: Secondary | ICD-10-CM

## 2022-06-15 DIAGNOSIS — B001 Herpesviral vesicular dermatitis: Secondary | ICD-10-CM

## 2022-06-15 MED ORDER — PROMETHAZINE-DM 6.25-15 MG/5ML PO SYRP: 5.0000 mL | ORAL_SOLUTION | Freq: Four times a day (QID) | ORAL | 0 refills | Status: DC | PRN

## 2022-06-15 MED ORDER — PREDNISONE 10 MG PO TABS: ORAL_TABLET | ORAL | 0 refills | Status: DC

## 2022-06-15 MED ORDER — ACYCLOVIR 5 % EX OINT: 1.0000 | TOPICAL_OINTMENT | CUTANEOUS | 0 refills | Status: DC

## 2022-06-15 NOTE — Progress Notes (Signed)
Virtual Visit via Video Note  I connected with [name] on 06/15/22 at 11:20 AM EST by a video enabled telemedicine application and verified that I am speaking with the correct person using two identifiers.  Location: Patient: Home Provider: Office  Persons participating in this video call: Nicki Reaper and Eiman Maret   I discussed the limitations of evaluation and management by telemedicine and the availability of in person appointments. The patient expressed understanding and agreed to proceed.  History of Present Illness:  Pt reports headache, runny nose, nasal congestion and cough.  This started 1 week ago.  She is blowing clear mucus out of her nose.  The cough is mostly nonproductive.  She denies ear pain, sore throat, shortness of breath, nausea, vomiting or diarrhea.  She denies fever, chills or body aches.  She has tried cough syrup OTC with minimal relief of symptoms.  She has had sick contacts and known exposure to COVID that she is aware of but not the flu.  She also reports that she has developed a cold sore on her right upper lip as well.  She has not tried anything OTC for this.  Past Medical History:  Diagnosis Date   Allergy    Anxiety    GERD (gastroesophageal reflux disease)    Hyperlipidemia    Hypertension    PVC (premature ventricular contraction) 07/20/2016   Sinus tachycardia 07/20/2016    Current Outpatient Medications  Medication Sig Dispense Refill   amLODipine (NORVASC) 10 MG tablet TAKE 1 TABLET BY MOUTH DAILY 90 tablet 0   atorvastatin (LIPITOR) 10 MG tablet TAKE 1 TABLET(10 MG) BY MOUTH DAILY AT 6 PM 90 tablet 0   cloNIDine (CATAPRES - DOSED IN MG/24 HR) 0.1 mg/24hr patch APPLY 1 PATCH(0.1 MG) TOPICALLY TO THE SKIN 1 TIME A WEEK 12 patch 0   losartan (COZAAR) 25 MG tablet TAKE 1 TABLET(25 MG) BY MOUTH DAILY 30 tablet 0   pantoprazole (PROTONIX) 40 MG tablet Take 1 tablet (40 mg total) by mouth daily. 90 tablet 1   No current  facility-administered medications for this visit.    Allergies  Allergen Reactions   Benazepril Cough   Lisinopril Cough    Family History  Problem Relation Age of Onset   Hypertension Mother    Heart murmur Father    Ovarian cancer Paternal Grandmother    Lupus Sister    Colon cancer Neg Hx    Esophageal cancer Neg Hx    Pancreatic cancer Neg Hx    Rectal cancer Neg Hx    Stomach cancer Neg Hx     Social History   Socioeconomic History   Marital status: Married    Spouse name: Not on file   Number of children: Not on file   Years of education: Not on file   Highest education level: Not on file  Occupational History   Not on file  Tobacco Use   Smoking status: Light Smoker    Packs/day: 0.30    Types: Cigarettes   Smokeless tobacco: Never   Tobacco comments:    black-n-milds   Substance and Sexual Activity   Alcohol use: Yes    Comment: occasional    Drug use: Yes    Types: Marijuana    Comment: occasional    Sexual activity: Yes    Birth control/protection: Surgical  Other Topics Concern   Not on file  Social History Narrative   Not on file   Social Determinants of Health  Financial Resource Strain: Not on file  Food Insecurity: Not on file  Transportation Needs: Not on file  Physical Activity: Not on file  Stress: Not on file  Social Connections: Not on file  Intimate Partner Violence: Not on file     Constitutional: Patient reports headache.  Denies fever, malaise, fatigue, or abrupt weight changes.  HEENT: Patient reports runny nose, nasal congestion.  Denies eye pain, eye redness, ear pain, ringing in the ears, wax buildup, bloody nose, or sore throat. Respiratory: Patient reports cough.  Denies difficulty breathing, shortness of breath, or sputum production.   Cardiovascular: Denies chest pain, chest tightness, palpitations or swelling in the hands or feet.  Gastrointestinal: Denies abdominal pain, bloating, constipation, diarrhea or blood in  the stool.  Skin: Patient reports cold sore right upper lip.  Denies redness, rashes, or ulcercations.    No other specific complaints in a complete review of systems (except as listed in HPI above).    Observations/Objective:    Wt Readings from Last 3 Encounters:  12/03/20 140 lb (63.5 kg)  03/27/20 146 lb (66.2 kg)  08/29/18 140 lb (63.5 kg)    General: Appears her stated age, appears unwell but in NAD. Skin: Cold sore noted to right upper lip. HEENT: Head: normal shape and size; Nose: No congestion noted; Throat/Mouth: Hoarseness noted Pulmonary/Chest: Normal effort. No respiratory distress.  Neurological: Alert and oriented.   BMET    Component Value Date/Time   NA 140 03/27/2020 1459   K 3.2 (L) 03/27/2020 1459   CL 99 03/27/2020 1459   CO2 30 03/27/2020 1459   GLUCOSE 88 03/27/2020 1459   BUN 11 03/27/2020 1459   CREATININE 0.64 03/27/2020 1459   CALCIUM 8.9 03/27/2020 1459   GFRNONAA >60 02/06/2017 0548   GFRAA >60 02/06/2017 0548    Lipid Panel     Component Value Date/Time   CHOL 149 03/27/2020 1459   TRIG 247.0 (H) 03/27/2020 1459   HDL 55.20 03/27/2020 1459   CHOLHDL 3 03/27/2020 1459   VLDL 49.4 (H) 03/27/2020 1459   LDLCALC 75 08/28/2016 1016    CBC    Component Value Date/Time   WBC 12.9 (H) 03/27/2020 1459   RBC 3.76 (L) 03/27/2020 1459   HGB 11.1 (L) 03/27/2020 1459   HGB 12.3 05/10/2007 1305   HCT 33.9 (L) 03/27/2020 1459   HCT 35.8 05/10/2007 1305   PLT 483.0 (H) 03/27/2020 1459   PLT 515 (H) 05/10/2007 1305   MCV 90.2 03/27/2020 1459   MCV 82.8 05/10/2007 1305   MCH 29.5 02/06/2017 0548   MCHC 32.9 03/27/2020 1459   RDW 15.9 (H) 03/27/2020 1459   RDW 15.1 (H) 05/10/2007 1305   LYMPHSABS 3.1 04/13/2017 1449   LYMPHSABS 2.4 05/10/2007 1305   MONOABS 0.6 04/13/2017 1449   MONOABS 0.4 05/10/2007 1305   EOSABS 0.1 04/13/2017 1449   EOSABS 0.1 05/10/2007 1305   BASOSABS 0.1 04/13/2017 1449   BASOSABS 0.0 05/10/2007 1305    Hgb  A1C No results found for: "HGBA1C"     Assessment and Plan:  Viral URI with Cough:  Encourage rest and fluids No indication to test for COVID or flu at this time given duration of symptoms Rx for Pred taper x 6 days for symptom management Rx for Promethazine DM cough syrup-sedation caution given Rx for Acyclovir ointment 5% for cold sore  Scheduled appointment for your annual exam  Follow Up Instructions:    I discussed the assessment and treatment plan  with the patient. The patient was provided an opportunity to ask questions and all were answered. The patient agreed with the plan and demonstrated an understanding of the instructions.   The patient was advised to call back or seek an in-person evaluation if the symptoms worsen or if the condition fails to improve as anticipated.    Webb Silversmith, NP

## 2022-06-15 NOTE — Patient Instructions (Signed)

## 2022-06-26 ENCOUNTER — Ambulatory Visit: Payer: BC Managed Care – PPO | Admitting: Internal Medicine

## 2022-06-26 NOTE — Progress Notes (Deleted)
Subjective:    Patient ID: [name], female    DOB: 1970-10-27, 51 y.o.   MRN: [id number]  HPI  Patient presents to clinic today for annual exam.  Flu: 03/2020 Tetanus: 08/2018 COVID: Pfizer x 2 Shingrix: Never Pap smear: Hysterectomy Mammogram: 02/2022 Colon screening: Vision screening: Dentist:  Diet: Exercise:  Review of Systems     Past Medical History:  Diagnosis Date   Allergy    Anxiety    GERD (gastroesophageal reflux disease)    Hyperlipidemia    Hypertension    PVC (premature ventricular contraction) 07/20/2016   Sinus tachycardia 07/20/2016    Current Outpatient Medications  Medication Sig Dispense Refill   acyclovir ointment (ZOVIRAX) 5 % Apply 1 Application topically every 3 (three) hours. 15 g 0   amLODipine (NORVASC) 10 MG tablet TAKE 1 TABLET BY MOUTH DAILY 90 tablet 0   atorvastatin (LIPITOR) 10 MG tablet TAKE 1 TABLET(10 MG) BY MOUTH DAILY AT 6 PM 90 tablet 0   cloNIDine (CATAPRES - DOSED IN MG/24 HR) 0.1 mg/24hr patch APPLY 1 PATCH(0.1 MG) TOPICALLY TO THE SKIN 1 TIME A WEEK 12 patch 0   losartan (COZAAR) 25 MG tablet TAKE 1 TABLET(25 MG) BY MOUTH DAILY 30 tablet 0   pantoprazole (PROTONIX) 40 MG tablet Take 1 tablet (40 mg total) by mouth daily. 90 tablet 1   predniSONE (DELTASONE) 10 MG tablet Take 6 tabs on day 1, 5 tabs on day 2, 4 tabs on day 3, 3 tabs on day 4, 2 tabs on day 5, 1 tab on day 6 21 tablet 0   promethazine-dextromethorphan (PROMETHAZINE-DM) 6.25-15 MG/5ML syrup Take 5 mLs by mouth 4 (four) times daily as needed. 118 mL 0   No current facility-administered medications for this visit.    Allergies  Allergen Reactions   Benazepril Cough   Lisinopril Cough    Family History  Problem Relation Age of Onset   Hypertension Mother    Heart murmur Father    Ovarian cancer Paternal Grandmother    Lupus Sister    Colon cancer Neg Hx    Esophageal cancer Neg Hx    Pancreatic cancer Neg Hx    Rectal cancer Neg Hx     Stomach cancer Neg Hx     Social History   Socioeconomic History   Marital status: Married    Spouse name: Not on file   Number of children: Not on file   Years of education: Not on file   Highest education level: Not on file  Occupational History   Not on file  Tobacco Use   Smoking status: Light Smoker    Packs/day: 0.30    Types: Cigarettes   Smokeless tobacco: Never   Tobacco comments:    black-n-milds   Substance and Sexual Activity   Alcohol use: Yes    Comment: occasional    Drug use: Yes    Types: Marijuana    Comment: occasional    Sexual activity: Yes    Birth control/protection: Surgical  Other Topics Concern   Not on file  Social History Narrative   Not on file   Social Determinants of Health   Financial Resource Strain: Not on file  Food Insecurity: Not on file  Transportation Needs: Not on file  Physical Activity: Not on file  Stress: Not on file  Social Connections: Not on file  Intimate Partner Violence: Not on file     Constitutional: Denies fever, malaise, fatigue, headache or abrupt  weight changes.  HEENT: Denies eye pain, eye redness, ear pain, ringing in the ears, wax buildup, runny nose, nasal congestion, bloody nose, or sore throat. Respiratory: Denies difficulty breathing, shortness of breath, cough or sputum production.   Cardiovascular: Denies chest pain, chest tightness, palpitations or swelling in the hands or feet.  Gastrointestinal: Denies abdominal pain, bloating, constipation, diarrhea or blood in the stool.  GU: Denies urgency, frequency, pain with urination, burning sensation, blood in urine, odor or discharge. Musculoskeletal: Denies decrease in range of motion, difficulty with gait, muscle pain or joint pain and swelling.  Skin: Denies redness, rashes, lesions or ulcercations.  Neurological: Patient reports hot flashes.  Denies dizziness, difficulty with memory, difficulty with speech or problems with balance and coordination.   Psych: Denies anxiety, depression, SI/HI.  No other specific complaints in a complete review of systems (except as listed in HPI above).  Objective:   Physical Exam   There were no vitals taken for this visit. Wt Readings from Last 3 Encounters:  12/03/20 140 lb (63.5 kg)  03/27/20 146 lb (66.2 kg)  08/29/18 140 lb (63.5 kg)    General: Appears their stated age, well developed, well nourished in NAD. Skin: Warm, dry and intact. No rashes, lesions or ulcerations noted. HEENT: Head: normal shape and size; Eyes: sclera white, no icterus, conjunctiva pink, PERRLA and EOMs intact; Ears: Tm's gray and intact, normal light reflex; Nose: mucosa pink and moist, septum midline; Throat/Mouth: Teeth present, mucosa pink and moist, no exudate, lesions or ulcerations noted.  Neck:  Neck supple, trachea midline. No masses, lumps or thyromegaly present.  Cardiovascular: Normal rate and rhythm. S1,S2 noted.  No murmur, rubs or gallops noted. No JVD or BLE edema. No carotid bruits noted. Pulmonary/Chest: Normal effort and positive vesicular breath sounds. No respiratory distress. No wheezes, rales or ronchi noted.  Abdomen: Soft and nontender. Normal bowel sounds. No distention or masses noted. Liver, spleen and kidneys non palpable. Musculoskeletal: Normal range of motion. No signs of joint swelling. No difficulty with gait.  Neurological: Alert and oriented. Cranial nerves II-XII grossly intact. Coordination normal.  Psychiatric: Mood and affect normal. Behavior is normal. Judgment and thought content normal.     BMET    Component Value Date/Time   NA 140 03/27/2020 1459   K 3.2 (L) 03/27/2020 1459   CL 99 03/27/2020 1459   CO2 30 03/27/2020 1459   GLUCOSE 88 03/27/2020 1459   BUN 11 03/27/2020 1459   CREATININE 0.64 03/27/2020 1459   CALCIUM 8.9 03/27/2020 1459   GFRNONAA >60 02/06/2017 0548   GFRAA >60 02/06/2017 0548    Lipid Panel     Component Value Date/Time   CHOL 149  03/27/2020 1459   TRIG 247.0 (H) 03/27/2020 1459   HDL 55.20 03/27/2020 1459   CHOLHDL 3 03/27/2020 1459   VLDL 49.4 (H) 03/27/2020 1459   LDLCALC 75 08/28/2016 1016    CBC    Component Value Date/Time   WBC 12.9 (H) 03/27/2020 1459   RBC 3.76 (L) 03/27/2020 1459   HGB 11.1 (L) 03/27/2020 1459   HGB 12.3 05/10/2007 1305   HCT 33.9 (L) 03/27/2020 1459   HCT 35.8 05/10/2007 1305   PLT 483.0 (H) 03/27/2020 1459   PLT 515 (H) 05/10/2007 1305   MCV 90.2 03/27/2020 1459   MCV 82.8 05/10/2007 1305   MCH 29.5 02/06/2017 0548   MCHC 32.9 03/27/2020 1459   RDW 15.9 (H) 03/27/2020 1459   RDW 15.1 (H) 05/10/2007 1305  LYMPHSABS 3.1 04/13/2017 1449   LYMPHSABS 2.4 05/10/2007 1305   MONOABS 0.6 04/13/2017 1449   MONOABS 0.4 05/10/2007 1305   EOSABS 0.1 04/13/2017 1449   EOSABS 0.1 05/10/2007 1305   BASOSABS 0.1 04/13/2017 1449   BASOSABS 0.0 05/10/2007 1305    Hgb A1C No results found for: "HGBA1C"         Assessment & Plan:   Preventative Health Maintenance:  Flu shot today Tetanus UTD Encouraged her to get her COVID booster Discussed Shingrix vaccine, she will check coverage with her insurance company and schedule a nurse visit if she would like to have this done She no longer needs Pap smears Mammogram UTD Referral to GI for screening colonoscopy Encouraged her to consume a balanced diet and exercise regimen Advised her to see an eye doctor and dentist annually We will check CBC, c-Met, lipid, A1c and hep C today  RTC in 6 months, follow-up chronic conditions Webb Silversmith, NP

## 2022-06-30 ENCOUNTER — Ambulatory Visit: Payer: BC Managed Care – PPO | Admitting: Internal Medicine

## 2022-06-30 NOTE — Progress Notes (Deleted)
Subjective:    Patient ID: [name], female    DOB: Sep 26, 1970, 51 y.o.   MRN: [id number]  HPI  Patient presents to clinic today for her annual exam.  Flu: 03/2020 Tetanus: 08/2018 COVID: Pfizer x 2 Shingrix: Never Pap smear: Hysterectomy Mammogram: 02/2022 Colon screening: Vision screening: Dentist:  Diet: Exercise:   Review of Systems  Past Medical History:  Diagnosis Date   Allergy    Anxiety    GERD (gastroesophageal reflux disease)    Hyperlipidemia    Hypertension    PVC (premature ventricular contraction) 07/20/2016   Sinus tachycardia 07/20/2016    Current Outpatient Medications  Medication Sig Dispense Refill   acyclovir ointment (ZOVIRAX) 5 % Apply 1 Application topically every 3 (three) hours. 15 g 0   amLODipine (NORVASC) 10 MG tablet TAKE 1 TABLET BY MOUTH DAILY 90 tablet 0   atorvastatin (LIPITOR) 10 MG tablet TAKE 1 TABLET(10 MG) BY MOUTH DAILY AT 6 PM 90 tablet 0   cloNIDine (CATAPRES - DOSED IN MG/24 HR) 0.1 mg/24hr patch APPLY 1 PATCH(0.1 MG) TOPICALLY TO THE SKIN 1 TIME A WEEK 12 patch 0   losartan (COZAAR) 25 MG tablet TAKE 1 TABLET(25 MG) BY MOUTH DAILY 30 tablet 0   pantoprazole (PROTONIX) 40 MG tablet Take 1 tablet (40 mg total) by mouth daily. 90 tablet 1   predniSONE (DELTASONE) 10 MG tablet Take 6 tabs on day 1, 5 tabs on day 2, 4 tabs on day 3, 3 tabs on day 4, 2 tabs on day 5, 1 tab on day 6 21 tablet 0   promethazine-dextromethorphan (PROMETHAZINE-DM) 6.25-15 MG/5ML syrup Take 5 mLs by mouth 4 (four) times daily as needed. 118 mL 0   No current facility-administered medications for this visit.    Allergies  Allergen Reactions   Benazepril Cough   Lisinopril Cough    Family History  Problem Relation Age of Onset   Hypertension Mother    Heart murmur Father    Ovarian cancer Paternal Grandmother    Lupus Sister    Colon cancer Neg Hx    Esophageal cancer Neg Hx    Pancreatic cancer Neg Hx    Rectal cancer Neg Hx     Stomach cancer Neg Hx     Social History   Socioeconomic History   Marital status: Married    Spouse name: Not on file   Number of children: Not on file   Years of education: Not on file   Highest education level: Not on file  Occupational History   Not on file  Tobacco Use   Smoking status: Light Smoker    Packs/day: 0.30    Types: Cigarettes   Smokeless tobacco: Never   Tobacco comments:    black-n-milds   Substance and Sexual Activity   Alcohol use: Yes    Comment: occasional    Drug use: Yes    Types: Marijuana    Comment: occasional    Sexual activity: Yes    Birth control/protection: Surgical  Other Topics Concern   Not on file  Social History Narrative   Not on file   Social Determinants of Health   Financial Resource Strain: Not on file  Food Insecurity: Not on file  Transportation Needs: Not on file  Physical Activity: Not on file  Stress: Not on file  Social Connections: Not on file  Intimate Partner Violence: Not on file     Constitutional: Denies fever, malaise, fatigue, headache or abrupt weight  changes.  HEENT: Denies eye pain, eye redness, ear pain, ringing in the ears, wax buildup, runny nose, nasal congestion, bloody nose, or sore throat. Respiratory: Denies difficulty breathing, shortness of breath, cough or sputum production.   Cardiovascular: Denies chest pain, chest tightness, palpitations or swelling in the hands or feet.  Gastrointestinal: Denies abdominal pain, bloating, constipation, diarrhea or blood in the stool.  GU: Denies urgency, frequency, pain with urination, burning sensation, blood in urine, odor or discharge. Musculoskeletal: Denies decrease in range of motion, difficulty with gait, muscle pain or joint pain and swelling.  Skin: Denies redness, rashes, lesions or ulcercations.  Neurological: Patient reports hot flashes.  Denies dizziness, difficulty with memory, difficulty with speech or problems with balance and coordination.   Psych: Denies anxiety, depression, SI/HI.  No other specific complaints in a complete review of systems (except as listed in HPI above).     Objective:   Physical Exam  There were no vitals taken for this visit. Wt Readings from Last 3 Encounters:  12/03/20 140 lb (63.5 kg)  03/27/20 146 lb (66.2 kg)  08/29/18 140 lb (63.5 kg)    General: Appears their stated age, well developed, well nourished in NAD. Skin: Warm, dry and intact. No rashes, lesions or ulcerations noted. HEENT: Head: normal shape and size; Eyes: sclera white, no icterus, conjunctiva pink, PERRLA and EOMs intact; Ears: Tm's gray and intact, normal light reflex; Nose: mucosa pink and moist, septum midline; Throat/Mouth: Teeth present, mucosa pink and moist, no exudate, lesions or ulcerations noted.  Neck:  Neck supple, trachea midline. No masses, lumps or thyromegaly present.  Cardiovascular: Normal rate and rhythm. S1,S2 noted.  No murmur, rubs or gallops noted. No JVD or BLE edema. No carotid bruits noted. Pulmonary/Chest: Normal effort and positive vesicular breath sounds. No respiratory distress. No wheezes, rales or ronchi noted.  Abdomen: Soft and nontender. Normal bowel sounds. No distention or masses noted. Liver, spleen and kidneys non palpable. Musculoskeletal: Normal range of motion. No signs of joint swelling. No difficulty with gait.  Neurological: Alert and oriented. Cranial nerves II-XII grossly intact. Coordination normal.  Psychiatric: Mood and affect normal. Behavior is normal. Judgment and thought content normal.    BMET    Component Value Date/Time   NA 140 03/27/2020 1459   K 3.2 (L) 03/27/2020 1459   CL 99 03/27/2020 1459   CO2 30 03/27/2020 1459   GLUCOSE 88 03/27/2020 1459   BUN 11 03/27/2020 1459   CREATININE 0.64 03/27/2020 1459   CALCIUM 8.9 03/27/2020 1459   GFRNONAA >60 02/06/2017 0548   GFRAA >60 02/06/2017 0548    Lipid Panel     Component Value Date/Time   CHOL 149  03/27/2020 1459   TRIG 247.0 (H) 03/27/2020 1459   HDL 55.20 03/27/2020 1459   CHOLHDL 3 03/27/2020 1459   VLDL 49.4 (H) 03/27/2020 1459   LDLCALC 75 08/28/2016 1016    CBC    Component Value Date/Time   WBC 12.9 (H) 03/27/2020 1459   RBC 3.76 (L) 03/27/2020 1459   HGB 11.1 (L) 03/27/2020 1459   HGB 12.3 05/10/2007 1305   HCT 33.9 (L) 03/27/2020 1459   HCT 35.8 05/10/2007 1305   PLT 483.0 (H) 03/27/2020 1459   PLT 515 (H) 05/10/2007 1305   MCV 90.2 03/27/2020 1459   MCV 82.8 05/10/2007 1305   MCH 29.5 02/06/2017 0548   MCHC 32.9 03/27/2020 1459   RDW 15.9 (H) 03/27/2020 1459   RDW 15.1 (H) 05/10/2007 1305  LYMPHSABS 3.1 04/13/2017 1449   LYMPHSABS 2.4 05/10/2007 1305   MONOABS 0.6 04/13/2017 1449   MONOABS 0.4 05/10/2007 1305   EOSABS 0.1 04/13/2017 1449   EOSABS 0.1 05/10/2007 1305   BASOSABS 0.1 04/13/2017 1449   BASOSABS 0.0 05/10/2007 1305    Hgb A1C No results found for: "HGBA1C"          Assessment & Plan:   Preventative Health Maintenance:  Flu shot today Tetanus UTD Encouraged her to get her COVID booster Discussed Shingrix vaccine, she will check coverage with her insurance company and schedule a nurse visit if she would like to have this done She no longer needs Pap smear Mammogram UTD Referral to GI for screening colonoscopy Encouraged her to consume a balanced diet and exercise regimen Advised her to see an eye doctor and dentist annually We will check CBC, c-Met, lipid, A1c and hep C today  RTC in 6 months, follow-up chronic conditions Webb Silversmith, NP

## 2022-07-02 ENCOUNTER — Ambulatory Visit: Payer: BC Managed Care – PPO | Admitting: Internal Medicine

## 2022-07-03 ENCOUNTER — Encounter: Payer: Self-pay | Admitting: Internal Medicine

## 2022-07-03 ENCOUNTER — Ambulatory Visit: Payer: BC Managed Care – PPO | Admitting: Internal Medicine

## 2022-07-03 VITALS — BP 140/94 | HR 100 | Temp 98.2°F | Resp 18 | Ht 63.5 in | Wt 142.4 lb

## 2022-07-03 DIAGNOSIS — Z1211 Encounter for screening for malignant neoplasm of colon: Secondary | ICD-10-CM

## 2022-07-03 DIAGNOSIS — Z23 Encounter for immunization: Secondary | ICD-10-CM

## 2022-07-03 DIAGNOSIS — E782 Mixed hyperlipidemia: Secondary | ICD-10-CM | POA: Diagnosis not present

## 2022-07-03 DIAGNOSIS — Z0001 Encounter for general adult medical examination with abnormal findings: Secondary | ICD-10-CM

## 2022-07-03 DIAGNOSIS — I10 Essential (primary) hypertension: Secondary | ICD-10-CM

## 2022-07-03 MED ORDER — BUSPIRONE HCL 5 MG PO TABS: 5.0000 mg | ORAL_TABLET | Freq: Two times a day (BID) | ORAL | 0 refills | Status: DC

## 2022-07-03 MED ORDER — CLONIDINE 0.1 MG/24HR TD PTWK: MEDICATED_PATCH | TRANSDERMAL | 1 refills | Status: DC

## 2022-07-03 MED ORDER — ATORVASTATIN CALCIUM 10 MG PO TABS: ORAL_TABLET | ORAL | 1 refills | Status: DC

## 2022-07-03 MED ORDER — AMLODIPINE-OLMESARTAN 10-40 MG PO TABS: 1.0000 | ORAL_TABLET | Freq: Every day | ORAL | 1 refills | Status: DC

## 2022-07-03 MED ORDER — PANTOPRAZOLE SODIUM 40 MG PO TBEC: 40.0000 mg | DELAYED_RELEASE_TABLET | Freq: Every day | ORAL | 1 refills | Status: DC

## 2022-07-03 NOTE — Assessment & Plan Note (Signed)
Uncontrolled We will discontinue amlodipine and losartan Rx for olmesartan-amlodipine 40-10 mg daily Reinforced DASH diet and exercise for weight loss C-Met today

## 2022-07-03 NOTE — Patient Instructions (Signed)
Health Maintenance for Postmenopausal Women Menopause is a normal process in which your ability to get pregnant comes to an end. This process happens slowly over many months or years, usually between the ages of 48 and 55. Menopause is complete when you have missed your menstrual period for 12 months. It is important to talk with your health care provider about some of the most common conditions that affect women after menopause (postmenopausal women). These include heart disease, cancer, and bone loss (osteoporosis). Adopting a healthy lifestyle and getting preventive care can help to promote your health and wellness. The actions you take can also lower your chances of developing some of these common conditions. What are the signs and symptoms of menopause? During menopause, you may have the following symptoms: Hot flashes. These can be moderate or severe. Night sweats. Decrease in sex drive. Mood swings. Headaches. Tiredness (fatigue). Irritability. Memory problems. Problems falling asleep or staying asleep. Talk with your health care provider about treatment options for your symptoms. Do I need hormone replacement therapy? Hormone replacement therapy is effective in treating symptoms that are caused by menopause, such as hot flashes and night sweats. Hormone replacement carries certain risks, especially as you become older. If you are thinking about using estrogen or estrogen with progestin, discuss the benefits and risks with your health care provider. How can I reduce my risk for heart disease and stroke? The risk of heart disease, heart attack, and stroke increases as you age. One of the causes may be a change in the body's hormones during menopause. This can affect how your body uses dietary fats, triglycerides, and cholesterol. Heart attack and stroke are medical emergencies. There are many things that you can do to help prevent heart disease and stroke. Watch your blood pressure High  blood pressure causes heart disease and increases the risk of stroke. This is more likely to develop in people who have high blood pressure readings or are overweight. Have your blood pressure checked: Every 3-5 years if you are 18-39 years of age. Every year if you are 40 years old or older. Eat a healthy diet  Eat a diet that includes plenty of vegetables, fruits, low-fat dairy products, and lean protein. Do not eat a lot of foods that are high in solid fats, added sugars, or sodium. Get regular exercise Get regular exercise. This is one of the most important things you can do for your health. Most adults should: Try to exercise for at least 150 minutes each week. The exercise should increase your heart rate and make you sweat (moderate-intensity exercise). Try to do strengthening exercises at least twice each week. Do these in addition to the moderate-intensity exercise. Spend less time sitting. Even light physical activity can be beneficial. Other tips Work with your health care provider to achieve or maintain a healthy weight. Do not use any products that contain nicotine or tobacco. These products include cigarettes, chewing tobacco, and vaping devices, such as e-cigarettes. If you need help quitting, ask your health care provider. Know your numbers. Ask your health care provider to check your cholesterol and your blood sugar (glucose). Continue to have your blood tested as directed by your health care provider. Do I need screening for cancer? Depending on your health history and family history, you may need to have cancer screenings at different stages of your life. This may include screening for: Breast cancer. Cervical cancer. Lung cancer. Colorectal cancer. What is my risk for osteoporosis? After menopause, you may be   at increased risk for osteoporosis. Osteoporosis is a condition in which bone destruction happens more quickly than new bone creation. To help prevent osteoporosis or  the bone fractures that can happen because of osteoporosis, you may take the following actions: If you are 19-50 years old, get at least 1,000 mg of calcium and at least 600 international units (IU) of vitamin D per day. If you are older than age 50 but younger than age 70, get at least 1,200 mg of calcium and at least 600 international units (IU) of vitamin D per day. If you are older than age 70, get at least 1,200 mg of calcium and at least 800 international units (IU) of vitamin D per day. Smoking and drinking excessive alcohol increase the risk of osteoporosis. Eat foods that are rich in calcium and vitamin D, and do weight-bearing exercises several times each week as directed by your health care provider. How does menopause affect my mental health? Depression may occur at any age, but it is more common as you become older. Common symptoms of depression include: Feeling depressed. Changes in sleep patterns. Changes in appetite or eating patterns. Feeling an overall lack of motivation or enjoyment of activities that you previously enjoyed. Frequent crying spells. Talk with your health care provider if you think that you are experiencing any of these symptoms. General instructions See your health care provider for regular wellness exams and vaccines. This may include: Scheduling regular health, dental, and eye exams. Getting and maintaining your vaccines. These include: Influenza vaccine. Get this vaccine each year before the flu season begins. Pneumonia vaccine. Shingles vaccine. Tetanus, diphtheria, and pertussis (Tdap) booster vaccine. Your health care provider may also recommend other immunizations. Tell your health care provider if you have ever been abused or do not feel safe at home. Summary Menopause is a normal process in which your ability to get pregnant comes to an end. This condition causes hot flashes, night sweats, decreased interest in sex, mood swings, headaches, or lack  of sleep. Treatment for this condition may include hormone replacement therapy. Take actions to keep yourself healthy, including exercising regularly, eating a healthy diet, watching your weight, and checking your blood pressure and blood sugar levels. Get screened for cancer and depression. Make sure that you are up to date with all your vaccines. This information is not intended to replace advice given to you by your health care provider. Make sure you discuss any questions you have with your health care provider. Document Revised: 11/11/2020 Document Reviewed: 11/11/2020 [employer] Patient Education  2023 [employer]  

## 2022-07-03 NOTE — Progress Notes (Signed)
Subjective:    Patient ID: [name], female    DOB: 1970/10/01, 51 y.o.   MRN: [id number]  HPI  Patient presents to clinic today for her annual exam. Of note, her BP today is 155/92.  She is taking Amlodipine and Losartan as prescribed.  Flu: 03/2020 Tetanus: 08/2018 COVID: Pfizer x 2 Shingrix: 2023 Pap smear: Hysterectomy Mammogram: 02/2022 Colon screening: never Vision screening: annually Dentist: biannually  Diet: She does eat meat. She consumes fruits and veggies. She does eat some fried foods. She drinks mostly juice, tea, some water. Exercise: None  Review of Systems     Past Medical History:  Diagnosis Date   Allergy    Anxiety    GERD (gastroesophageal reflux disease)    Hyperlipidemia    Hypertension    PVC (premature ventricular contraction) 07/20/2016   Sinus tachycardia 07/20/2016    Current Outpatient Medications  Medication Sig Dispense Refill   acyclovir ointment (ZOVIRAX) 5 % Apply 1 Application topically every 3 (three) hours. 15 g 0   amLODipine (NORVASC) 10 MG tablet TAKE 1 TABLET BY MOUTH DAILY 90 tablet 0   atorvastatin (LIPITOR) 10 MG tablet TAKE 1 TABLET(10 MG) BY MOUTH DAILY AT 6 PM 90 tablet 0   cloNIDine (CATAPRES - DOSED IN MG/24 HR) 0.1 mg/24hr patch APPLY 1 PATCH(0.1 MG) TOPICALLY TO THE SKIN 1 TIME A WEEK 12 patch 0   losartan (COZAAR) 25 MG tablet TAKE 1 TABLET(25 MG) BY MOUTH DAILY 30 tablet 0   pantoprazole (PROTONIX) 40 MG tablet Take 1 tablet (40 mg total) by mouth daily. 90 tablet 1   predniSONE (DELTASONE) 10 MG tablet Take 6 tabs on day 1, 5 tabs on day 2, 4 tabs on day 3, 3 tabs on day 4, 2 tabs on day 5, 1 tab on day 6 21 tablet 0   promethazine-dextromethorphan (PROMETHAZINE-DM) 6.25-15 MG/5ML syrup Take 5 mLs by mouth 4 (four) times daily as needed. 118 mL 0   No current facility-administered medications for this visit.    Allergies  Allergen Reactions   Benazepril Cough   Lisinopril Cough    Family History   Problem Relation Age of Onset   Hypertension Mother    Heart murmur Father    Ovarian cancer Paternal Grandmother    Lupus Sister    Colon cancer Neg Hx    Esophageal cancer Neg Hx    Pancreatic cancer Neg Hx    Rectal cancer Neg Hx    Stomach cancer Neg Hx     Social History   Socioeconomic History   Marital status: Married    Spouse name: Not on file   Number of children: Not on file   Years of education: Not on file   Highest education level: Not on file  Occupational History   Not on file  Tobacco Use   Smoking status: Light Smoker    Packs/day: 0.30    Types: Cigarettes   Smokeless tobacco: Never   Tobacco comments:    black-n-milds   Substance and Sexual Activity   Alcohol use: Yes    Comment: occasional    Drug use: Yes    Types: Marijuana    Comment: occasional    Sexual activity: Yes    Birth control/protection: Surgical  Other Topics Concern   Not on file  Social History Narrative   Not on file   Social Determinants of Health   Financial Resource Strain: Not on file  Food Insecurity: Not on  file  Transportation Needs: Not on file  Physical Activity: Not on file  Stress: Not on file  Social Connections: Not on file  Intimate Partner Violence: Not on file     Constitutional: Denies fever, malaise, fatigue, headache or abrupt weight changes.  HEENT: Denies eye pain, eye redness, ear pain, ringing in the ears, wax buildup, runny nose, nasal congestion, bloody nose, or sore throat. Respiratory: Denies difficulty breathing, shortness of breath, cough or sputum production.   Cardiovascular: Denies chest pain, chest tightness, palpitations or swelling in the hands or feet.  Gastrointestinal: Denies abdominal pain, bloating, constipation, diarrhea or blood in the stool.  GU: Denies urgency, frequency, pain with urination, burning sensation, blood in urine, odor or discharge. Musculoskeletal: Denies decrease in range of motion, difficulty with gait,  muscle pain or joint pain and swelling.  Skin: Denies redness, rashes, lesions or ulcercations.  Neurological: Patient reports hot flashes.  Denies dizziness, difficulty with memory, difficulty with speech or problems with balance and coordination.  Psych: Denies anxiety, depression, SI/HI.  No other specific complaints in a complete review of systems (except as listed in HPI above).  Objective:   Physical Exam   BP (!) 140/94 (BP Location: Right Arm, Patient Position: Sitting, Cuff Size: Normal)   Pulse 100   Temp 98.2 F (36.8 C) (Oral)   Resp 18   Ht 5' 3.5" (1.613 m)   Wt 142 lb 6.4 oz (64.6 kg)   SpO2 97%   BMI 24.83 kg/m   Wt Readings from Last 3 Encounters:  12/03/20 140 lb (63.5 kg)  03/27/20 146 lb (66.2 kg)  08/29/18 140 lb (63.5 kg)    General: Appears her stated age, well developed, well nourished in NAD. Skin: Warm, dry and intact. HEENT: Head: normal shape and size; Eyes: sclera white, no icterus, conjunctiva pink, PERRLA and EOMs intact;  Neck:  Neck supple, trachea midline. No masses, lumps or thyromegaly present.  Cardiovascular: Normal rate and rhythm. S1,S2 noted.  No murmur, rubs or gallops noted. No JVD or BLE edema. No carotid bruits noted. Pulmonary/Chest: Normal effort and positive vesicular breath sounds. No respiratory distress. No wheezes, rales or ronchi noted.  Abdomen: Normal bowel sounds.  Musculoskeletal: Strength 5/5 BUE/BLE.  No difficulty with gait.  Neurological: Alert and oriented. Cranial nerves II-XII grossly intact. Coordination normal.  Psychiatric: Mood and affect normal. Behavior is normal. Judgment and thought content normal.     BMET    Component Value Date/Time   NA 140 03/27/2020 1459   K 3.2 (L) 03/27/2020 1459   CL 99 03/27/2020 1459   CO2 30 03/27/2020 1459   GLUCOSE 88 03/27/2020 1459   BUN 11 03/27/2020 1459   CREATININE 0.64 03/27/2020 1459   CALCIUM 8.9 03/27/2020 1459   GFRNONAA >60 02/06/2017 0548   GFRAA >60  02/06/2017 0548    Lipid Panel     Component Value Date/Time   CHOL 149 03/27/2020 1459   TRIG 247.0 (H) 03/27/2020 1459   HDL 55.20 03/27/2020 1459   CHOLHDL 3 03/27/2020 1459   VLDL 49.4 (H) 03/27/2020 1459   LDLCALC 75 08/28/2016 1016    CBC    Component Value Date/Time   WBC 12.9 (H) 03/27/2020 1459   RBC 3.76 (L) 03/27/2020 1459   HGB 11.1 (L) 03/27/2020 1459   HGB 12.3 05/10/2007 1305   HCT 33.9 (L) 03/27/2020 1459   HCT 35.8 05/10/2007 1305   PLT 483.0 (H) 03/27/2020 1459   PLT 515 (H) 05/10/2007  1305   MCV 90.2 03/27/2020 1459   MCV 82.8 05/10/2007 1305   MCH 29.5 02/06/2017 0548   MCHC 32.9 03/27/2020 1459   RDW 15.9 (H) 03/27/2020 1459   RDW 15.1 (H) 05/10/2007 1305   LYMPHSABS 3.1 04/13/2017 1449   LYMPHSABS 2.4 05/10/2007 1305   MONOABS 0.6 04/13/2017 1449   MONOABS 0.4 05/10/2007 1305   EOSABS 0.1 04/13/2017 1449   EOSABS 0.1 05/10/2007 1305   BASOSABS 0.1 04/13/2017 1449   BASOSABS 0.0 05/10/2007 1305    Hgb A1C No results found for: "HGBA1C"         Assessment & Plan:   Preventative Health Maintenance:  Flu shot today Tetanus UTD Encouraged her to get her COVID booster Shingrix vaccine UTD She no longer needs Pap smears Mammogram UTD Encouraged her to consume a balanced diet and exercise regimen Advised her to see an eye doctor and dentist annually We will check CBC, c-Met, lipid  RTC in 6 months, follow-up chronic conditions Webb Silversmith, NP

## 2022-07-04 LAB — COMPLETE METABOLIC PANEL WITH GFR
AG Ratio: 1.4 (calc) (ref 1.0–2.5)
ALT: 26 U/L (ref 6–29)
AST: 31 U/L (ref 10–35)
Albumin: 4.9 g/dL (ref 3.6–5.1)
Alkaline phosphatase (APISO): 110 U/L (ref 37–153)
BUN: 10 mg/dL (ref 7–25)
CO2: 27 mmol/L (ref 20–32)
Calcium: 9.7 mg/dL (ref 8.6–10.4)
Chloride: 102 mmol/L (ref 98–110)
Creat: 0.51 mg/dL (ref 0.50–1.03)
Globulin: 3.5 g/dL (calc) (ref 1.9–3.7)
Glucose, Bld: 86 mg/dL (ref 65–99)
Potassium: 3.4 mmol/L — ABNORMAL LOW (ref 3.5–5.3)
Sodium: 142 mmol/L (ref 135–146)
Total Bilirubin: 0.6 mg/dL (ref 0.2–1.2)
Total Protein: 8.4 g/dL — ABNORMAL HIGH (ref 6.1–8.1)
eGFR: 112 mL/min/{1.73_m2} (ref >=60)

## 2022-07-04 LAB — CBC
HCT: 38.9 % (ref 35.0–45.0)
Hemoglobin: 13.3 g/dL (ref 11.7–15.5)
MCH: 29.9 pg (ref 27.0–33.0)
MCHC: 34.2 g/dL (ref 32.0–36.0)
MCV: 87.4 fL (ref 80.0–100.0)
MPV: 9.4 fL (ref 7.5–12.5)
Platelets: 491 10*3/uL — ABNORMAL HIGH (ref 140–400)
RBC: 4.45 10*6/uL (ref 3.80–5.10)
RDW: 14.7 % (ref 11.0–15.0)
WBC: 9.7 10*3/uL (ref 3.8–10.8)

## 2022-07-04 LAB — LIPID PANEL
Cholesterol: 218 mg/dL — ABNORMAL HIGH (ref <200)
HDL: 82 mg/dL (ref >=50)
LDL Cholesterol (Calc): 104 mg/dL (calc) — ABNORMAL HIGH
Non-HDL Cholesterol (Calc): 136 mg/dL (calc) — ABNORMAL HIGH (ref <130)
Total CHOL/HDL Ratio: 2.7 (calc) (ref <5.0)
Triglycerides: 196 mg/dL — ABNORMAL HIGH (ref <150)

## 2022-07-17 ENCOUNTER — Encounter: Payer: Self-pay | Admitting: Internal Medicine

## 2022-07-17 ENCOUNTER — Telehealth (INDEPENDENT_AMBULATORY_CARE_PROVIDER_SITE_OTHER): Payer: BC Managed Care – PPO | Admitting: Internal Medicine

## 2022-07-17 DIAGNOSIS — J019 Acute sinusitis, unspecified: Secondary | ICD-10-CM

## 2022-07-17 DIAGNOSIS — B9689 Other specified bacterial agents as the cause of diseases classified elsewhere: Secondary | ICD-10-CM

## 2022-07-17 MED ORDER — AMOXICILLIN-POT CLAVULANATE 875-125 MG PO TABS: 1.0000 | ORAL_TABLET | Freq: Two times a day (BID) | ORAL | 0 refills | Status: DC

## 2022-07-17 NOTE — Progress Notes (Signed)
Virtual Visit via Video Note  I connected with [name] on 07/17/22 at  4:00 PM EST by a video enabled telemedicine application and verified that I am speaking with the correct person using two identifiers.  Location: Patient: Home Provider: Office  Persons participating in this video call: Nicki Reaper, NP and Elie Goody   I discussed the limitations of evaluation and management by telemedicine and the availability of in person appointments. The patient expressed understanding and agreed to proceed.  History of Present Illness:  Patient reports sinus pressure, sinus headache, ear pain and sore throat. This started 5 days ago.  She denies runny nose, nasal congestion, cough, shortness of breath.  She denies fever, chills or body aches.  She has tried Tylenol sinus with minimal relief of symptoms.   She was treated for viral URI 1 month ago with Prednisone and Promethazine DM.   Past Medical History:  Diagnosis Date   Allergy    Anxiety    GERD (gastroesophageal reflux disease)    Hyperlipidemia    Hypertension    PVC (premature ventricular contraction) 07/20/2016   Sinus tachycardia 07/20/2016    Current Outpatient Medications  Medication Sig Dispense Refill   acyclovir ointment (ZOVIRAX) 5 % Apply 1 Application topically every 3 (three) hours. 15 g 0   amLODipine-olmesartan (AZOR) 10-40 MG tablet Take 1 tablet by mouth daily. 90 tablet 1   atorvastatin (LIPITOR) 10 MG tablet TAKE 1 TABLET(10 MG) BY MOUTH DAILY AT 6 PM 90 tablet 1   busPIRone (BUSPAR) 5 MG tablet Take 1 tablet (5 mg total) by mouth 2 (two) times daily. 180 tablet 0   cloNIDine (CATAPRES - DOSED IN MG/24 HR) 0.1 mg/24hr patch APPLY 1 PATCH(0.1 MG) TOPICALLY TO THE SKIN 1 TIME A WEEK 12 patch 1   pantoprazole (PROTONIX) 40 MG tablet Take 1 tablet (40 mg total) by mouth daily. 90 tablet 1   No current facility-administered medications for this visit.    Allergies  Allergen Reactions   Benazepril  Cough   Lisinopril Cough    Family History  Problem Relation Age of Onset   Hypertension Mother    Heart murmur Father    Ovarian cancer Paternal Grandmother    Lupus Sister    Colon cancer Neg Hx    Esophageal cancer Neg Hx    Pancreatic cancer Neg Hx    Rectal cancer Neg Hx    Stomach cancer Neg Hx     Social History   Socioeconomic History   Marital status: Married    Spouse name: Not on file   Number of children: Not on file   Years of education: Not on file   Highest education level: Not on file  Occupational History   Not on file  Tobacco Use   Smoking status: Former    Packs/day: 0.30    Types: Cigarettes   Smokeless tobacco: Never   Tobacco comments:    black-n-milds   Vaping Use   Vaping Use: Some days  Substance and Sexual Activity   Alcohol use: Yes    Comment: occasional    Drug use: Yes    Types: Marijuana    Comment: occasional    Sexual activity: Yes    Birth control/protection: Surgical  Other Topics Concern   Not on file  Social History Narrative   Not on file   Social Determinants of Health   Financial Resource Strain: Not on file  Food Insecurity: Not on file  Transportation  Needs: Not on file  Physical Activity: Not on file  Stress: Not on file  Social Connections: Not on file  Intimate Partner Violence: Not on file     Constitutional: Denies fever, malaise, fatigue, headache or abrupt weight changes.  HEENT: Patient reports sinus pain and pressure, ear pain and sore throat.  Denies eye pain, eye redness,  ringing in the ears, wax buildup, runny nose, nasal congestion, bloody nose. Respiratory: Denies difficulty breathing, shortness of breath, cough or sputum production.   Cardiovascular: Denies chest pain, chest tightness, palpitations or swelling in the hands or feet.  Gastrointestinal: Denies abdominal pain, bloating, constipation, diarrhea or blood in the stool.   No other specific complaints in a complete review of systems  (except as listed in HPI above).  Observations/Objective:   Wt Readings from Last 3 Encounters:  07/03/22 142 lb 6.4 oz (64.6 kg)  12/03/20 140 lb (63.5 kg)  03/27/20 146 lb (66.2 kg)    General: Appears her stated age, well developed, well nourished in NAD. HEENT: Head: normal shape and size, she points to maxillary and ethmoid sinuses as the site of her pain;  Nose: No congestion.; Throat/Mouth: No hoarseness noted Pulmonary/Chest: Normal effort. No respiratory distress.  Neurological: Alert and oriented.   BMET    Component Value Date/Time   NA 142 07/03/2022 1345   K 3.4 (L) 07/03/2022 1345   CL 102 07/03/2022 1345   CO2 27 07/03/2022 1345   GLUCOSE 86 07/03/2022 1345   BUN 10 07/03/2022 1345   CREATININE 0.51 07/03/2022 1345   CALCIUM 9.7 07/03/2022 1345   GFRNONAA >60 02/06/2017 0548   GFRAA >60 02/06/2017 0548    Lipid Panel     Component Value Date/Time   CHOL 218 (H) 07/03/2022 1345   TRIG 196 (H) 07/03/2022 1345   HDL 82 07/03/2022 1345   CHOLHDL 2.7 07/03/2022 1345   VLDL 49.4 (H) 03/27/2020 1459   LDLCALC 104 (H) 07/03/2022 1345    CBC    Component Value Date/Time   WBC 9.7 07/03/2022 1345   RBC 4.45 07/03/2022 1345   HGB 13.3 07/03/2022 1345   HGB 12.3 05/10/2007 1305   HCT 38.9 07/03/2022 1345   HCT 35.8 05/10/2007 1305   PLT 491 (H) 07/03/2022 1345   PLT 515 (H) 05/10/2007 1305   MCV 87.4 07/03/2022 1345   MCV 82.8 05/10/2007 1305   MCH 29.9 07/03/2022 1345   MCHC 34.2 07/03/2022 1345   RDW 14.7 07/03/2022 1345   RDW 15.1 (H) 05/10/2007 1305   LYMPHSABS 3.1 04/13/2017 1449   LYMPHSABS 2.4 05/10/2007 1305   MONOABS 0.6 04/13/2017 1449   MONOABS 0.4 05/10/2007 1305   EOSABS 0.1 04/13/2017 1449   EOSABS 0.1 05/10/2007 1305   BASOSABS 0.1 04/13/2017 1449   BASOSABS 0.0 05/10/2007 1305    Hgb A1C No results found for: "HGBA1C"     Assessment and Plan: Acute Bacterial Sinusitis:  Recommend she start Flonase daily Can use a Nettie  pot which can be purchased from your local pharmacy Rx for Augmentin 875-125 mg twice daily x 10 days  RTC in 5 months for follow-up of chronic conditions  Follow Up Instructions:    I discussed the assessment and treatment plan with the patient. The patient was provided an opportunity to ask questions and all were answered. The patient agreed with the plan and demonstrated an understanding of the instructions.   The patient was advised to call back or seek an in-person evaluation if  the symptoms worsen or if the condition fails to improve as anticipated.   Webb Silversmith, NP

## 2022-07-17 NOTE — Patient Instructions (Signed)

## 2022-09-21 ENCOUNTER — Encounter: Payer: Self-pay | Admitting: Internal Medicine

## 2022-09-24 MED ORDER — ATORVASTATIN CALCIUM 20 MG PO TABS: 20.0000 mg | ORAL_TABLET | Freq: Every day | ORAL | 0 refills | Status: DC

## 2022-09-26 ENCOUNTER — Other Ambulatory Visit: Payer: Self-pay | Admitting: Internal Medicine

## 2022-09-28 NOTE — Telephone Encounter (Signed)
Requested Prescriptions  Pending Prescriptions Disp Refills   busPIRone (BUSPAR) 5 MG tablet [Pharmacy Med Name: BUSPIRONE 5MG  TABLETS] 180 tablet 3    Sig: TAKE 1 TABLET(5 MG) BY MOUTH TWICE DAILY     Psychiatry: Anxiolytics/Hypnotics - Non-controlled Passed - 09/26/2022  1:26 PM      Passed - Valid encounter within last 12 months    Recent Outpatient Visits           2 months ago Acute bacterial sinusitis   [facility] [place], Coralie Keens, NP   2 months ago Encounter for general adult medical examination with abnormal findings   [facility] [place], Coralie Keens, NP   3 months ago Viral URI with cough   [facility], Coralie Keens, NP   1 year ago HYPERTENSION, [facility], Coralie Keens, NP   1 year ago Viral upper respiratory tract infection   [facility] [place], Coralie Keens, NP       Future Appointments             In 3 months Baity, Coralie Keens, NP [facility], [facility]

## 2022-12-25 ENCOUNTER — Other Ambulatory Visit: Payer: Self-pay | Admitting: Internal Medicine

## 2022-12-26 ENCOUNTER — Other Ambulatory Visit: Payer: Self-pay | Admitting: Internal Medicine

## 2022-12-28 NOTE — Telephone Encounter (Signed)
Requested Prescriptions  Pending Prescriptions Disp Refills   atorvastatin (LIPITOR) 20 MG tablet [Pharmacy Med Name: ATORVASTATIN 20MG  TABLETS] 90 tablet 2    Sig: TAKE 1 TABLET(20 MG) BY MOUTH DAILY     Cardiovascular:  Antilipid - Statins Failed - 12/26/2022  4:02 PM      Failed - Lipid Panel in normal range within the last 12 months    Cholesterol  Date Value Ref Range Status  07/03/2022 218 (H) <200 mg/dL Final   LDL Cholesterol (Calc)  Date Value Ref Range Status  07/03/2022 104 (H) mg/dL (calc) Final    Comment:    Reference range: <100 . Desirable range <100 mg/dL for primary prevention;   <70 mg/dL for patients with CHD or diabetic patients  with > or = 2 CHD risk factors. Marland Kitchen LDL-C is now calculated using the Martin-Hopkins  calculation, which is a validated novel method providing  better accuracy than the Friedewald equation in the  estimation of LDL-C.  Horald Pollen et al. Lenox Ahr. 1610;960(45): 2061-2068  (http://education.[employer].com/faq/FAQ164)    Direct LDL  Date Value Ref Range Status  03/27/2020 60.0 mg/dL Final    Comment:    Optimal:  <100 mg/dLNear or Above Optimal:  100-129 mg/dLBorderline High:  130-159 mg/dLHigh:  160-189 mg/dLVery High:  >190 mg/dL   HDL  Date Value Ref Range Status  07/03/2022 82 > OR = 50 mg/dL Final   Triglycerides  Date Value Ref Range Status  07/03/2022 196 (H) <150 mg/dL Final         Passed - Patient is not pregnant      Passed - Valid encounter within last 12 months    Recent Outpatient Visits           5 months ago Acute bacterial sinusitis   [place] [facility] [place], Salvadore Oxford, NP   5 months ago Encounter for general adult medical examination with abnormal findings   [place] [facility] [place], Salvadore Oxford, NP   6 months ago Viral URI with cough   [facility] [place], Salvadore Oxford, NP   1 year ago HYPERTENSION, BENIGN ESSENTIAL   [place] [facility] [place], Salvadore Oxford, NP   1 year ago Viral upper respiratory tract infection   [place] [facility] [place], Salvadore Oxford, NP       Future Appointments             In 1 week Sampson Si, Salvadore Oxford, NP [facility], [facility]

## 2022-12-28 NOTE — Telephone Encounter (Signed)
Requested Prescriptions  Pending Prescriptions Disp Refills   amLODipine-olmesartan (AZOR) 10-40 MG tablet [Pharmacy Med Name: AMLODIPINE/OLMES MEDOXOM 10-40MG  T] 90 tablet 0    Sig: TAKE 1 TABLET BY MOUTH DAILY     Cardiovascular: CCB + ARB Combos Failed - 12/25/2022  5:05 PM      Failed - K in normal range and within 180 days    Potassium  Date Value Ref Range Status  07/03/2022 3.4 (L) 3.5 - 5.3 mmol/L Final         Failed - Last BP in normal range    BP Readings from Last 1 Encounters:  07/03/22 (!) 140/94         Passed - Cr in normal range and within 180 days    Creat  Date Value Ref Range Status  07/03/2022 0.51 0.50 - 1.03 mg/dL Final         Passed - Na in normal range and within 180 days    Sodium  Date Value Ref Range Status  07/03/2022 142 135 - 146 mmol/L Final         Passed - Patient is not pregnant      Passed - Valid encounter within last 6 months    Recent Outpatient Visits           5 months ago Acute bacterial sinusitis   [place] [facility] [place], Salvadore Oxford, NP   5 months ago Encounter for general adult medical examination with abnormal findings   [place] [facility] [place], Salvadore Oxford, NP   6 months ago Viral URI with cough   [place] [facility] [place], Salvadore Oxford, NP   1 year ago HYPERTENSION, BENIGN ESSENTIAL   [place] [facility] [place], Salvadore Oxford, NP   1 year ago Viral upper respiratory tract infection   [place] [facility] [place], Salvadore Oxford, NP       Future Appointments             In 1 week Sampson Si, Salvadore Oxford, NP [place] [facility], [facility]

## 2023-01-01 ENCOUNTER — Ambulatory Visit (INDEPENDENT_AMBULATORY_CARE_PROVIDER_SITE_OTHER): Payer: BC Managed Care – PPO | Admitting: Internal Medicine

## 2023-01-05 ENCOUNTER — Ambulatory Visit: Payer: BC Managed Care – PPO | Admitting: Internal Medicine

## 2023-01-05 ENCOUNTER — Encounter: Payer: Self-pay | Admitting: Internal Medicine

## 2023-01-05 VITALS — BP 124/80 | HR 94 | Temp 96.8°F | Wt 148.0 lb

## 2023-01-05 DIAGNOSIS — E782 Mixed hyperlipidemia: Secondary | ICD-10-CM

## 2023-01-05 DIAGNOSIS — Z1211 Encounter for screening for malignant neoplasm of colon: Secondary | ICD-10-CM | POA: Diagnosis not present

## 2023-01-05 DIAGNOSIS — R7309 Other abnormal glucose: Secondary | ICD-10-CM | POA: Diagnosis not present

## 2023-01-05 DIAGNOSIS — I10 Essential (primary) hypertension: Secondary | ICD-10-CM | POA: Diagnosis not present

## 2023-01-05 DIAGNOSIS — F418 Other specified anxiety disorders: Secondary | ICD-10-CM

## 2023-01-05 DIAGNOSIS — R739 Hyperglycemia, unspecified: Secondary | ICD-10-CM

## 2023-01-05 DIAGNOSIS — K219 Gastro-esophageal reflux disease without esophagitis: Secondary | ICD-10-CM

## 2023-01-05 DIAGNOSIS — D75839 Thrombocytosis, unspecified: Secondary | ICD-10-CM

## 2023-01-05 DIAGNOSIS — N951 Menopausal and female climacteric states: Secondary | ICD-10-CM

## 2023-01-05 MED ORDER — AMLODIPINE-OLMESARTAN 10-40 MG PO TABS: 1.0000 | ORAL_TABLET | Freq: Every day | ORAL | 0 refills | Status: DC

## 2023-01-05 MED ORDER — PANTOPRAZOLE SODIUM 40 MG PO TBEC: 40.0000 mg | DELAYED_RELEASE_TABLET | Freq: Every day | ORAL | 1 refills | Status: DC

## 2023-01-05 NOTE — Assessment & Plan Note (Signed)
C-Met and lipid profile today Encouraged to consume a low-fat diet Continue atorvastatin 

## 2023-01-05 NOTE — Progress Notes (Signed)
Subjective:    Patient ID: [name], female    DOB: 1970-10-15, 52 y.o.   MRN: [id number]  HPI  Patient presents to clinic today for 19-month follow-up of chronic conditions.  HTN: Her BP today is 124/80.  She is taking amlodipine-olmesartan as prescribed.  ECG from 02/2017 reviewed.  HLD: Her last LDL was 104, triglycerides 196, 06/2022.  She denies myalgias on atorvastatin.  She does not consume a low-fat diet.  GERD: She is not sure what triggers this.  She denies breakthrough pantoprazole.  Upper GI from 12/2007 reviewed.  Menopausal symptoms: She reports mainly hot flashes, mood swings and brain fog.  She is taking clonidine as prescribed.  Anxiety: Situational.  She is taking buspirone as prescribed.  She is not currently seeing a therapist.  She denies depression, SI/HI.  Thrombocytosis: Her last platelet count was 491, 06/2022.  She does not follow with hematology.  Review of Systems  Past Medical History:  Diagnosis Date   Allergy    Anxiety    GERD (gastroesophageal reflux disease)    Hyperlipidemia    Hypertension    PVC (premature ventricular contraction) 07/20/2016   Sinus tachycardia 07/20/2016    Current Outpatient Medications  Medication Sig Dispense Refill   acyclovir ointment (ZOVIRAX) 5 % Apply 1 Application topically every 3 (three) hours. 15 g 0   amLODipine-olmesartan (AZOR) 10-40 MG tablet TAKE 1 TABLET BY MOUTH DAILY 90 tablet 0   amoxicillin-clavulanate (AUGMENTIN) 875-125 MG tablet Take 1 tablet by mouth 2 (two) times daily. 20 tablet 0   atorvastatin (LIPITOR) 20 MG tablet TAKE 1 TABLET(20 MG) BY MOUTH DAILY 90 tablet 2   busPIRone (BUSPAR) 5 MG tablet TAKE 1 TABLET(5 MG) BY MOUTH TWICE DAILY 180 tablet 3   cloNIDine (CATAPRES - DOSED IN MG/24 HR) 0.1 mg/24hr patch APPLY 1 PATCH(0.1 MG) TOPICALLY TO THE SKIN 1 TIME A WEEK 12 patch 1   pantoprazole (PROTONIX) 40 MG tablet Take 1 tablet (40 mg total) by mouth daily. 90 tablet 1   No current  facility-administered medications for this visit.    Allergies  Allergen Reactions   Benazepril Cough   Lisinopril Cough    Family History  Problem Relation Age of Onset   Hypertension Mother    Heart murmur Father    Ovarian cancer Paternal Grandmother    Lupus Sister    Colon cancer Neg Hx    Esophageal cancer Neg Hx    Pancreatic cancer Neg Hx    Rectal cancer Neg Hx    Stomach cancer Neg Hx     Social History   Socioeconomic History   Marital status: Married    Spouse name: Not on file   Number of children: Not on file   Years of education: Not on file   Highest education level: Not on file  Occupational History   Not on file  Tobacco Use   Smoking status: Former    Packs/day: .3    Types: Cigarettes   Smokeless tobacco: Never   Tobacco comments:    black-n-milds   Vaping Use   Vaping Use: Some days  Substance and Sexual Activity   Alcohol use: Yes    Comment: occasional    Drug use: Yes    Types: Marijuana    Comment: occasional    Sexual activity: Yes    Birth control/protection: Surgical  Other Topics Concern   Not on file  Social History Narrative   Not on file  Social Determinants of Health   Financial Resource Strain: Not on file  Food Insecurity: Not on file  Transportation Needs: Not on file  Physical Activity: Not on file  Stress: Not on file  Social Connections: Not on file  Intimate Partner Violence: Not on file     Constitutional: Denies fever, malaise, fatigue, headache or abrupt weight changes.  HEENT: Pt reports watery eyes. Denies eye pain, eye redness, ear pain, ringing in the ears, wax buildup, runny nose, nasal congestion, bloody nose, or sore throat. Respiratory: Denies difficulty breathing, shortness of breath, cough or sputum production.   Cardiovascular: Denies chest pain, chest tightness, palpitations or swelling in the hands or feet.  Gastrointestinal: Denies abdominal pain, bloating, constipation, diarrhea or blood  in the stool.  GU: Denies urgency, frequency, pain with urination, burning sensation, blood in urine, odor or discharge. Musculoskeletal: Denies decrease in range of motion, difficulty with gait, muscle pain or joint pain and swelling.  Skin: Denies redness, rashes, lesions or ulcercations.  Neurological: Patient reports hot flashes and brain fog.  Denies dizziness, difficulty with memory, difficulty with speech or problems with balance and coordination.  Psych: Patient reports intermittent anxiety.  Denies depression, SI/HI.  No other specific complaints in a complete review of systems (except as listed in HPI above).     Objective:   Physical Exam   BP 124/80 (BP Location: Left Arm, Patient Position: Sitting, Cuff Size: Normal)   Pulse 94   Temp (!) 96.8 F (36 C) (Temporal)   Wt 148 lb (67.1 kg)   SpO2 96%   BMI 25.81 kg/m   Wt Readings from Last 3 Encounters:  07/03/22 142 lb 6.4 oz (64.6 kg)  12/03/20 140 lb (63.5 kg)  03/27/20 146 lb (66.2 kg)    General: Appears her stated age, overweight, in NAD. Skin: Warm, dry and intact.  HEENT: Head: normal shape and size; Eyes: sclera white, no icterus, conjunctiva pink, PERRLA and EOMs intact; Cardiovascular: Normal rate and rhythm. S1,S2 noted.  No murmur, rubs or gallops noted. No JVD or BLE edema. No carotid bruits noted. Pulmonary/Chest: Normal effort and positive vesicular breath sounds. No respiratory distress. No wheezes, rales or ronchi noted.  Abdomen: Soft and nontender. Normal bowel sounds.  Musculoskeletal:  No difficulty with gait.  Neurological: Alert and oriented. Coordination normal.  Psychiatric: Mood and affect normal. Behavior is normal. Judgment and thought content normal.    BMET    Component Value Date/Time   NA 142 07/03/2022 1345   K 3.4 (L) 07/03/2022 1345   CL 102 07/03/2022 1345   CO2 27 07/03/2022 1345   GLUCOSE 86 07/03/2022 1345   BUN 10 07/03/2022 1345   CREATININE 0.51 07/03/2022 1345    CALCIUM 9.7 07/03/2022 1345   GFRNONAA >60 02/06/2017 0548   GFRAA >60 02/06/2017 0548    Lipid Panel     Component Value Date/Time   CHOL 218 (H) 07/03/2022 1345   TRIG 196 (H) 07/03/2022 1345   HDL 82 07/03/2022 1345   CHOLHDL 2.7 07/03/2022 1345   VLDL 49.4 (H) 03/27/2020 1459   LDLCALC 104 (H) 07/03/2022 1345    CBC    Component Value Date/Time   WBC 9.7 07/03/2022 1345   RBC 4.45 07/03/2022 1345   HGB 13.3 07/03/2022 1345   HGB 12.3 05/10/2007 1305   HCT 38.9 07/03/2022 1345   HCT 35.8 05/10/2007 1305   PLT 491 (H) 07/03/2022 1345   PLT 515 (H) 05/10/2007 1305   MCV  87.4 07/03/2022 1345   MCV 82.8 05/10/2007 1305   MCH 29.9 07/03/2022 1345   MCHC 34.2 07/03/2022 1345   RDW 14.7 07/03/2022 1345   RDW 15.1 (H) 05/10/2007 1305   LYMPHSABS 3.1 04/13/2017 1449   LYMPHSABS 2.4 05/10/2007 1305   MONOABS 0.6 04/13/2017 1449   MONOABS 0.4 05/10/2007 1305   EOSABS 0.1 04/13/2017 1449   EOSABS 0.1 05/10/2007 1305   BASOSABS 0.1 04/13/2017 1449   BASOSABS 0.0 05/10/2007 1305    Hgb A1C No results found for: "HGBA1C"         Assessment & Plan:     RTC in 6 months for annual exam Nicki Reaper, NP

## 2023-01-05 NOTE — Assessment & Plan Note (Signed)
Continue buspirone as needed Support offered 

## 2023-01-05 NOTE — Assessment & Plan Note (Signed)
Try to identify and avoid foods that trigger reflux Encourage weight loss as this can help reduce reflux symptoms Continue pantoprazole, refilled today

## 2023-01-05 NOTE — Assessment & Plan Note (Signed)
Controlled on amlodipine-olmesartan Reinforced DASH diet and exercise for weight loss C-Met today 

## 2023-01-05 NOTE — Assessment & Plan Note (Signed)
Continue clonidine patch

## 2023-01-05 NOTE — Patient Instructions (Signed)

## 2023-01-05 NOTE — Assessment & Plan Note (Signed)
CBC today.  

## 2023-01-06 ENCOUNTER — Other Ambulatory Visit: Payer: Self-pay | Admitting: Internal Medicine

## 2023-01-06 LAB — LIPID PANEL
Cholesterol: 176 mg/dL (ref <200)
HDL: 78 mg/dL (ref >=50)
LDL Cholesterol (Calc): 69 mg/dL (calc)
Non-HDL Cholesterol (Calc): 98 mg/dL (calc) (ref <130)
Total CHOL/HDL Ratio: 2.3 (calc) (ref <5.0)
Triglycerides: 217 mg/dL — ABNORMAL HIGH (ref <150)

## 2023-01-06 LAB — COMPLETE METABOLIC PANEL WITH GFR
AG Ratio: 1.5 (calc) (ref 1.0–2.5)
ALT: 30 U/L — ABNORMAL HIGH (ref 6–29)
AST: 35 U/L (ref 10–35)
Albumin: 4.8 g/dL (ref 3.6–5.1)
Alkaline phosphatase (APISO): 109 U/L (ref 37–153)
BUN: 8 mg/dL (ref 7–25)
CO2: 28 mmol/L (ref 20–32)
Calcium: 9.4 mg/dL (ref 8.6–10.4)
Chloride: 100 mmol/L (ref 98–110)
Creat: 0.56 mg/dL (ref 0.50–1.03)
Globulin: 3.3 g/dL (calc) (ref 1.9–3.7)
Glucose, Bld: 79 mg/dL (ref 65–99)
Potassium: 3.6 mmol/L (ref 3.5–5.3)
Sodium: 141 mmol/L (ref 135–146)
Total Bilirubin: 0.7 mg/dL (ref 0.2–1.2)
Total Protein: 8.1 g/dL (ref 6.1–8.1)
eGFR: 109 mL/min/{1.73_m2} (ref >=60)

## 2023-01-06 LAB — CBC
HCT: 38.9 % (ref 35.0–45.0)
Hemoglobin: 13 g/dL (ref 11.7–15.5)
MCH: 29.3 pg (ref 27.0–33.0)
MCHC: 33.4 g/dL (ref 32.0–36.0)
MCV: 87.6 fL (ref 80.0–100.0)
MPV: 9.2 fL (ref 7.5–12.5)
Platelets: 467 10*3/uL — ABNORMAL HIGH (ref 140–400)
RBC: 4.44 10*6/uL (ref 3.80–5.10)
RDW: 16 % — ABNORMAL HIGH (ref 11.0–15.0)
WBC: 11.8 10*3/uL — ABNORMAL HIGH (ref 3.8–10.8)

## 2023-01-06 LAB — HEMOGLOBIN A1C
Hgb A1c MFr Bld: 6.3 % of total Hgb — ABNORMAL HIGH (ref <5.7)
Mean Plasma Glucose: 134 mg/dL
eAG (mmol/L): 7.4 mmol/L

## 2023-01-06 NOTE — Telephone Encounter (Signed)
Refilled 01/05/23 # 90 with 1 refill Requested Prescriptions  Refused Prescriptions Disp Refills   pantoprazole (PROTONIX) 40 MG tablet [Pharmacy Med Name: PANTOPRAZOLE 40MG  TABLETS] 90 tablet 1    Sig: TAKE 1 TABLET(40 MG) BY MOUTH DAILY     Gastroenterology: Proton Pump Inhibitors Passed - 01/06/2023  6:25 AM      Passed - Valid encounter within last 12 months    Recent Outpatient Visits           Yesterday Mixed hyperlipidemia   [place] [facility] [place], Salvadore Oxford, NP   5 months ago Acute bacterial sinusitis   [place] [facility] [place], Salvadore Oxford, NP   6 months ago Encounter for general adult medical examination with abnormal findings   [place] [facility] [place], Salvadore Oxford, NP   6 months ago Viral URI with cough   [place] [facility] [place], Salvadore Oxford, NP   1 year ago HYPERTENSION, BENIGN ESSENTIAL   [place] [facility] [place], Salvadore Oxford, NP       Future Appointments             In 6 months Ba[place], Salvadore Oxford, NP [place], [facility]

## 2023-01-12 LAB — COLOGUARD

## 2023-01-30 ENCOUNTER — Encounter: Payer: Self-pay | Admitting: Internal Medicine

## 2023-02-01 MED ORDER — FENOFIBRATE MICRONIZED 67 MG PO CAPS: 67.0000 mg | ORAL_CAPSULE | Freq: Every day | ORAL | 1 refills | Status: DC

## 2023-04-07 ENCOUNTER — Other Ambulatory Visit: Payer: Self-pay | Admitting: Internal Medicine

## 2023-04-07 NOTE — Telephone Encounter (Signed)
Requested Prescriptions  Pending Prescriptions Disp Refills   cloNIDine (CATAPRES - DOSED IN MG/24 HR) 0.1 mg/24hr patch [Pharmacy Med Name: CLONIDINE 0.1MG /24H WEEKLY PATCH] 12 patch 0    Sig: APPLY 1 PATCH(0.1 MG) TOPICALLY TO THE SKIN 1 TIME A WEEK     Cardiovascular:  Alpha-2 Agonists Passed - 04/07/2023  4:38 AM      Passed - Last BP in normal range    BP Readings from Last 1 Encounters:  01/05/23 124/80         Passed - Last Heart Rate in normal range    Pulse Readings from Last 1 Encounters:  01/05/23 94         Passed - Valid encounter within last 6 months    Recent Outpatient Visits           3 months ago Mixed hyperlipidemia   [place] [facility] [place], Salvadore Oxford, NP   8 months ago Acute bacterial sinusitis   [place] [facility] [place], Salvadore Oxford, NP   9 months ago Encounter for general adult medical examination with abnormal findings   [place] [facility] [place], Salvadore Oxford, NP   9 months ago Viral URI with cough   [place] [facility] [place], Salvadore Oxford, NP   1 year ago HYPERTENSION, BENIGN ESSENTIAL   [facility] [place], Salvadore Oxford, NP       Future Appointments             In 3 months Baity, Salvadore Oxford, NP  [facility], [facility]

## 2023-04-13 ENCOUNTER — Other Ambulatory Visit: Payer: Self-pay | Admitting: Internal Medicine

## 2023-04-13 DIAGNOSIS — Z1231 Encounter for screening mammogram for malignant neoplasm of breast: Secondary | ICD-10-CM

## 2023-05-11 ENCOUNTER — Telehealth: Payer: BC Managed Care – PPO | Admitting: Family Medicine

## 2023-05-11 ENCOUNTER — Encounter: Payer: Self-pay | Admitting: Family Medicine

## 2023-05-11 DIAGNOSIS — M533 Sacrococcygeal disorders, not elsewhere classified: Secondary | ICD-10-CM

## 2023-05-11 NOTE — Progress Notes (Signed)
Needs in person assessment since first line treatment did not improve her symptoms. Might benefit from an injection  Patient acknowledged agreement and understanding of the plan.

## 2023-05-14 ENCOUNTER — Ambulatory Visit: Payer: BC Managed Care – PPO | Attending: Internal Medicine

## 2023-05-14 ENCOUNTER — Ambulatory Visit: Payer: BC Managed Care – PPO | Attending: Internal Medicine | Admitting: Internal Medicine

## 2023-05-14 ENCOUNTER — Encounter: Payer: Self-pay | Admitting: Internal Medicine

## 2023-05-14 VITALS — BP 120/80 | HR 99 | Ht 63.0 in | Wt 150.2 lb

## 2023-05-14 DIAGNOSIS — I493 Ventricular premature depolarization: Secondary | ICD-10-CM

## 2023-05-14 DIAGNOSIS — I10 Essential (primary) hypertension: Secondary | ICD-10-CM

## 2023-05-14 DIAGNOSIS — R000 Tachycardia, unspecified: Secondary | ICD-10-CM

## 2023-05-14 DIAGNOSIS — E782 Mixed hyperlipidemia: Secondary | ICD-10-CM

## 2023-05-14 DIAGNOSIS — I491 Atrial premature depolarization: Secondary | ICD-10-CM

## 2023-05-14 NOTE — Progress Notes (Signed)
Cardiology Office Note:  .    Date:  05/14/2023  ID:  [name], DOB January 02, 1971, MRN [id number] PCP: Lorre Munroe, NP  [facility] Providers Cardiologist:  Christell Constant, MD     CC: palpitations Consulted for the evaluation of palpitations at the behest of Dr. Sampson Si   History of Present Illness: [name]    [name] is a 52 y.o. female with a history of HTN, HLD, PACs, and PVCs presenting with palpitations.  Discussed the use of AI scribe software for clinical note transcription with the patient, who gave verbal consent to proceed.  Reed Lafont, a 52 year old individual with a history of sinus tachycardia, bi-atrial enlargement, and lateral ST depressions that resolved on todays repeat EKG (prior negative stress test), presents with new onset palpitations. She reports episodes of heart racing, with heart rates reaching up to 140 bpm, even while at rest. These episodes are typically self-limiting, resolving within 5-10 minutes. She also reports a sensation of tightness during these episodes, which she attributes to anxiety. She denies any associated chest pain, breathing issues, or syncope.  Her medical history is significant for hypertension, hyperlipidemia, and basal septal hypertrophy. She has been managed on a combination pill of amlodipine and olmesartan for hypertension, and atorvastatin for hyperlipidemia. Her blood pressure and cholesterol levels have been well-controlled on these medications. She also has a history of premature atrial contractions and premature ventricular contractions, as evidenced by a heart monitor done in 2017. An echocardiogram done at the same time showed normal heart function with mild basal septal hypertrophy.  She reports no significant family history of heart rhythm issues, except for a father with a heart murmur diagnosed in his seventies. She maintains an active lifestyle, walking approximately 15,000 steps daily at work. She  has been experiencing non-pitting edema in her legs, potentially related to her amlodipine medication.  No chest pain or breathing issues save for during her palpitations.  Relevant histories: .  Social former TT patient, she works as a Systems developer ROS: As per HPI.   Studies Reviewed: .   Cardiac Studies & Procedures     STRESS TESTS  EXERCISE TOLERANCE TEST (ETT) 03/05/2016  Narrative  Blood pressure demonstrated a hypertensive response to exercise.  There was no ST segment deviation noted during stress.  Mildly decreased exercise capacity. Baseline hypertension with hypertensive response to stress. No ischemia.   ECHOCARDIOGRAM  ECHOCARDIOGRAM COMPLETE 03/05/2016  Narrative *[facility]* [address] [place], [place] [address] [phone]  ------------------------------------------------------------------- Transthoracic Echocardiography  Patient:    Melissa, Reed MR #:       [id number] Study Date: 03/05/2016 Gender:     F Age:        45 Height:     161.3 cm Weight:     60.4 kg BSA:        1.65 m^2 Pt. Status: Room:  ORDERING     Armanda Magic, MD REFERRING    Armanda Magic, MD ATTENDING    Marca Ancona, M.D. REFERRING    Lorre Munroe SONOGRAPHER  Clearence Ped, RCS PERFORMING   [facility], Outpatient  cc:  ------------------------------------------------------------------- LV EF: 60% -   65%  ------------------------------------------------------------------- Indications:      Palpitations (R00.2).  ------------------------------------------------------------------- History:   PMH:   Dyspnea.  Risk factors:  Hypertension.  ------------------------------------------------------------------- Study Conclusions  - Left ventricle: The cavity size was normal. There was mild focal basal hypertrophy of the septum. Systolic function was  normal. The estimated ejection fraction was in the range of 60% to 65%. Doppler parameters are  consistent with abnormal left ventricular relaxation (grade 1 diastolic dysfunction). - Aortic valve: There was no stenosis. - Mitral valve: There was no significant regurgitation. - Right ventricle: The cavity size was normal. Systolic function was normal. - Tricuspid valve: Peak RV-RA gradient (S): 14 mm Hg. - Pulmonary arteries: PA peak pressure: 17 mm Hg (S). - Inferior vena cava: The vessel was normal in size. The respirophasic diameter changes were in the normal range (= 50%), consistent with normal central venous pressure.  Impressions:  - Normal LV size with mild focal basal septal hypertrophy. EF 60-65%. Normal RV size and systolic function. No significant valvular abnormalities.  ------------------------------------------------------------------- Study data:  No prior study was available for comparison.  Study status:  Routine.  Procedure:  The patient reported no pain pre or post test. Transthoracic echocardiography. Image quality was adequate.          Transthoracic echocardiography.  M-mode, complete 2D, spectral Doppler, and color Doppler.  Birthdate: Patient birthdate: 1970/09/14.  Age:  Patient is 52 yr old.  Sex: Gender: female.    BMI: 23.2 kg/m^2.  Blood pressure:     124/86 Patient status:  Outpatient.  Study date:  Study date: 03/05/2016. Study time: 02:09 PM.  Location:  [facility]  -------------------------------------------------------------------  ------------------------------------------------------------------- Left ventricle:  The cavity size was normal. There was mild focal basal hypertrophy of the septum. Systolic function was normal. The estimated ejection fraction was in the range of 60% to 65%. Doppler parameters are consistent with abnormal left ventricular relaxation (grade 1 diastolic dysfunction).  ------------------------------------------------------------------- Aortic valve:   Trileaflet.  Doppler:   There was no stenosis. There  was no regurgitation.  ------------------------------------------------------------------- Aorta:  Aortic root: The aortic root was normal in size. Ascending aorta: The ascending aorta was normal in size.  ------------------------------------------------------------------- Mitral valve:   Normal thickness leaflets .  Doppler:   There was no evidence for stenosis.   There was no significant regurgitation.  ------------------------------------------------------------------- Left atrium:  The atrium was normal in size.  ------------------------------------------------------------------- Right ventricle:  The cavity size was normal. Systolic function was normal.  ------------------------------------------------------------------- Tricuspid valve:   Doppler:  There was trivial regurgitation.  ------------------------------------------------------------------- Right atrium:  The atrium was normal in size.  ------------------------------------------------------------------- Systemic veins: Inferior vena cava: The vessel was normal in size. The respirophasic diameter changes were in the normal range (= 50%), consistent with normal central venous pressure. Diameter: 9 mm.  ------------------------------------------------------------------- Measurements  IVC                                         Value        Reference ID                                          9     mm     ---------  Left ventricle                              Value        Reference LV ID, ED, PLAX chordal             (L)  35.1  mm     43 - 52 LV ID, ES, PLAX chordal             (L)     22.9  mm     23 - 38 LV fx shortening, PLAX chordal              35    %      >=29 LV PW thickness, ED                         10.1  mm     --------- IVS/LV PW ratio, ED                         1.27         <=1.3 Stroke volume, 2D                           61    ml     --------- Stroke volume/bsa, 2D                       37     ml/m^2 --------- LV ejection fraction, 1-p A4C               63    %      --------- LV e&', lateral                              13.2  cm/s   --------- LV E/e&', lateral                            4.67         --------- LV e&', medial                               9.46  cm/s   --------- LV E/e&', medial                             6.51         --------- LV e&', average                              11.33 cm/s   --------- LV E/e&', average                            5.44         ---------  Ventricular septum                          Value        Reference IVS thickness, ED                           12.8  mm     ---------  LVOT                                        Value  Reference LVOT ID, S                                  20    mm     --------- LVOT area                                   3.14  cm^2   --------- LVOT peak velocity, S                       91.6  cm/s   --------- LVOT mean velocity, S                       63.6  cm/s   --------- LVOT VTI, S                                 19.3  cm     ---------  Aorta                                       Value        Reference Aortic root ID, ED                          34    mm     ---------  Left atrium                                 Value        Reference LA ID, A-P, ES                              28    mm     --------- LA ID/bsa, A-P                              1.7   cm/m^2 <=2.2 LA volume, S                                29.5  ml     --------- LA volume/bsa, S                            17.9  ml/m^2 --------- LA volume, ES, 1-p A4C                      22.3  ml     --------- LA volume/bsa, ES, 1-p A4C                  13.5  ml/m^2 --------- LA volume, ES, 1-p A2C                      35.7  ml     --------- LA volume/bsa, ES, 1-p A2C  21.6  ml/m^2 ---------  Mitral valve                                Value        Reference Mitral E-wave peak velocity                 61.6  cm/s   --------- Mitral A-wave  peak velocity                 73.2  cm/s   --------- Mitral deceleration time                    208   ms     150 - 230 Mitral E/A ratio, peak                      0.8          ---------  Pulmonary arteries                          Value        Reference PA pressure, S, DP                          17    mm Hg  <=30  Tricuspid valve                             Value        Reference Tricuspid regurg peak velocity              190   cm/s   --------- Tricuspid peak RV-RA gradient               14    mm Hg  --------- Tricuspid maximal regurg velocity,          190   cm/s   --------- PISA  Systemic veins                              Value        Reference Estimated CVP                               3     mm Hg  ---------  Right ventricle                             Value        Reference TAPSE                                       16.5  mm     --------- RV s&', lateral, S                           11.2  cm/s   ---------  Legend: (L)  and  (H)  mark values outside specified reference range.  ------------------------------------------------------------------- Prepared and Electronically Authenticated by  Marca Ancona, M.D. 2017-08-31T23:20:07    MONITORS  CARDIAC EVENT MONITOR 02/27/2016  Narrative  Normal sinus rhythm  and sinus tachycardia with heart rate ranging from 90 to 140bpm.  SVT with heart rate up to 160bpm. This is probably sinus tachycardia.  Occasional PACs            Physical Exam:    VS:  BP 120/80   Pulse 99   Ht 5\' 3"  (1.6 m)   Wt 150 lb 3.2 oz (68.1 kg)   SpO2 94%   BMI 26.61 kg/m    Wt Readings from Last 3 Encounters:  05/14/23 150 lb 3.2 oz (68.1 kg)  01/05/23 148 lb (67.1 kg)  07/03/22 142 lb 6.4 oz (64.6 kg)    Gen: no distress  Neck: No JVD Cardiac: No Rubs or Gallops, no murmur, RRR +2 radial pulses Respiratory: Clear to auscultation bilaterally, normal effort, normal  respiratory rate GI: Soft, nontender, non-distended  MS: No  edema;   moves all extremities Integument: Skin feels warm Neuro:  At time of evaluation, alert and oriented to person/place/time/situation  Psych: Normal affect, patient feels well   ASSESSMENT AND PLAN: .    Premature Atrial Contractions (PACs) with palpitations - suspect symptomatic occasional PACs with heart rates up to 140 bpm, usually around 130 bpm, even at rest. Symptoms include mild tightness and discomfort, resolving with relaxation. Previous heart monitor in 2017 confirmed PACs. Current EKG shows elevated heart rates. PACs are not life-threatening but can cause discomfort. If confirmed, a low-dose metoprolol PRN will be prescribed. If symptoms improve with metoprolol, it may be continued; otherwise, alternative treatments will be considered. - Order a two-week non-live heart monitor - If PACs are confirmed, prescribe low-dose metoprolol PRN (25 mg PO Q6hr) - Evaluate response to metoprolol and adjust treatment as needed  Premature Ventricular Contractions (PVCs) PVCs with a previous normal echocardiogram except for mild basal septal hypertrophy. If frequent PVCs are detected, a repeat echocardiogram will be performed to reassess heart function. - If significant PVCs are detected on heart monitor, repeat echocardiogram  Hypertension Well-controlled on amlodipine and olmesartan. Current blood pressure is 120/80 mmHg. - Continue current antihypertensive regimen  Non-pitting Edema Non-pitting edema in legs, likely secondary to amlodipine. Will monitor for changes in edema. - low threshold to change norvasc  Hyperlipidemia - Well-controlled on atorvastatin 20 mg  Follow-up - Schedule follow-up with allied health professional in three months to review heart monitor results and assess symptom burden.   Riley Lam, MD FASE [facility] Cardiologist [facility]  [address] [place], [address] [place], [place] [address] [phone]  10:42 AM

## 2023-05-14 NOTE — Progress Notes (Unsigned)
Enrolled patient for a 14 day Zio XT  monitor to be mailed to patients home  °

## 2023-05-14 NOTE — Patient Instructions (Signed)
Medication Instructions:  Your physician recommends that you continue on your current medications as directed. Please refer to the Current Medication list given to you today.  *If you need a refill on your cardiac medications before your next appointment, please call your pharmacy*   Lab Work: NONE If you have labs (blood work) drawn today and your tests are completely normal, you will receive your results only by: MyChart Message (if you have MyChart) OR A paper copy in the mail If you have any lab test that is abnormal or we need to change your treatment, we will call you to review the results.   Testing/Procedures: Your physician has requested that you wear a 2 week heart monitor.   Follow-Up: At [facility], you and your health needs are our priority.  As part of our continuing mission to provide you with exceptional heart care, we have created designated Provider Care Teams.  These Care Teams include your primary Cardiologist (physician) and Advanced Practice Providers (APPs -  Physician Assistants and Nurse Practitioners) who all work together to provide you with the care you need, when you need it.  We recommend signing up for the patient portal called "MyChart".  Sign up information is provided on this After Visit Summary.  MyChart is used to connect with patients for Virtual Visits (Telemedicine).  Patients are able to view lab/test results, encounter notes, upcoming appointments, etc.  Non-urgent messages can be sent to your provider as well.   To learn more about what you can do with MyChart, go to [email or web address].    Your next appointment:   3 month(s)  Provider:   Ronie Spies, PA-C, Robin Searing, NP, Jacolyn Reedy, PA-C, Eligha Bridegroom, NP, Tereso Newcomer, PA-C, or Perlie Gold, PA-C        Other Instructions Christena Deem- Long Term Monitor Instructions  Your physician has requested you wear a ZIO patch monitor for 14 days.  This is a single patch monitor.  [employer] supplies one patch monitor per enrollment. Additional stickers are not available. Please do not apply patch if you will be having a Nuclear Stress Test,   Cardiac CT, MRI, or Chest Xray during the period you would be wearing the  monitor. The patch cannot be worn during these tests. You cannot remove and re-apply the  ZIO XT patch monitor.  Your ZIO patch monitor will be mailed 3 day USPS to your address on file. It may take 3-5 days  to receive your monitor after you have been enrolled.  Once you have received your monitor, please review the enclosed instructions. Your monitor  has already been registered assigning a specific monitor serial # to you.  Billing and Patient Assistance Program Information  We have supplied [employer] with any of your insurance information on file for billing purposes. [employer] offers a sliding scale Patient Assistance Program for patients that do not have  insurance, or whose insurance does not completely cover the cost of the ZIO monitor.  You must apply for the Patient Assistance Program to qualify for this discounted rate.  To apply, please call [employer] at [phone], select option 4, select option 2, ask to apply for  Patient Assistance Program. Meredeth Ide will ask your household income, and how many people  are in your household. They will quote your out-of-pocket cost based on that information.  [employer] will also be able to set up a 37-month, interest-free payment plan if needed.  Applying the monitor   Shave hair from upper left  chest.  Hold abrader disc by orange tab. Rub abrader in 40 strokes over the upper left chest as  indicated in your monitor instructions.  Clean area with 4 enclosed alcohol pads. Let dry.  Apply patch as indicated in monitor instructions. Patch will be placed under collarbone on left  side of chest with arrow pointing upward.  Rub patch adhesive wings for 2 minutes. Remove white label marked "1". Remove the white  label  marked "2". Rub patch adhesive wings for 2 additional minutes.  While looking in a mirror, press and release button in center of patch. A small green light will  flash 3-4 times. This will be your only indicator that the monitor has been turned on.  Do not shower for the first 24 hours. You may shower after the first 24 hours.  Press the button if you feel a symptom. You will hear a small click. Record Date, Time and  Symptom in the Patient Logbook.  When you are ready to remove the patch, follow instructions on the last 2 pages of Patient  Logbook. Stick patch monitor onto the last page of Patient Logbook.  Place Patient Logbook in the blue and white box. Use locking tab on box and tape box closed  securely. The blue and white box has prepaid postage on it. Please place it in the mailbox as  soon as possible. Your physician should have your test results approximately 7 days after the  monitor has been mailed back to [employer] [employer].  Call [facility] Customer Care at [phone] if you have questions regarding  your ZIO XT patch monitor. Call them immediately if you see an orange light blinking on your  monitor.  If your monitor falls off in less than 4 days, contact our Monitor department at [phone].  If your monitor becomes loose or falls off after 4 days call [employer] at [phone] for  suggestions on securing your monitor

## 2023-05-28 DIAGNOSIS — E782 Mixed hyperlipidemia: Secondary | ICD-10-CM

## 2023-05-28 DIAGNOSIS — I493 Ventricular premature depolarization: Secondary | ICD-10-CM

## 2023-05-28 DIAGNOSIS — I10 Essential (primary) hypertension: Secondary | ICD-10-CM | POA: Diagnosis not present

## 2023-05-28 DIAGNOSIS — I491 Atrial premature depolarization: Secondary | ICD-10-CM

## 2023-07-05 ENCOUNTER — Other Ambulatory Visit: Payer: Self-pay | Admitting: Internal Medicine

## 2023-07-05 ENCOUNTER — Ambulatory Visit: Payer: BC Managed Care – PPO | Admitting: Internal Medicine

## 2023-07-05 ENCOUNTER — Encounter: Payer: Self-pay | Admitting: Internal Medicine

## 2023-07-05 VITALS — BP 122/84 | Ht 63.0 in | Wt 146.8 lb

## 2023-07-05 DIAGNOSIS — G8929 Other chronic pain: Secondary | ICD-10-CM

## 2023-07-05 DIAGNOSIS — M5441 Lumbago with sciatica, right side: Secondary | ICD-10-CM

## 2023-07-05 DIAGNOSIS — Z23 Encounter for immunization: Secondary | ICD-10-CM

## 2023-07-05 MED ORDER — PREDNISONE 10 MG PO TABS: ORAL_TABLET | ORAL | 0 refills | Status: DC

## 2023-07-05 NOTE — Patient Instructions (Signed)

## 2023-07-05 NOTE — Progress Notes (Signed)
Subjective:    Patient ID: [name], female    DOB: [birth year], 52 y.o.   MRN: [id number]  HPI   Discussed the use of AI scribe software for clinical note transcription with the patient, who gave verbal consent to proceed.  The patient, with a history of low back pain for a couple of months, presents with worsening symptoms. The pain, initially localized to the lower back, has now radiated down to the right foot, making it difficult for the patient to put pressure on it. The pain is described as sharp, particularly in the ankle and foot, but no longer felt in the lower back. There is no associated numbness, tingling, or weakness, and bowel or bladder control remains normal.  The onset of the pain was after lifting a heavy child at work. The patient sought care at an urgent care center in late October, where she received a Toradol injection, a steroid dose pack, and a muscle relaxer (Cyclobenzaprine). The patient reports temporary relief for about a week, but the pain returned and has persisted since.  The patient has attempted various interventions including stretching, massage, and regular intake of ibuprofen, which provides temporary relief and allows for mobility in the morning. However, the pain disrupts sleep, particularly when lying on the left side. The patient denies any recent imaging of the back.        Review of Systems   Past Medical History:  Diagnosis Date   Allergy    Anxiety    GERD (gastroesophageal reflux disease)    Hyperlipidemia    Hypertension    PVC (premature ventricular contraction) 07/20/2016   Sinus tachycardia 07/20/2016    Current Outpatient Medications  Medication Sig Dispense Refill   acyclovir ointment (ZOVIRAX) 5 % Apply 1 Application topically every 3 (three) hours. 15 g 0   amLODipine-olmesartan (AZOR) 10-40 MG tablet Take 1 tablet by mouth daily. 90 tablet 0   atorvastatin (LIPITOR) 20 MG tablet TAKE 1 TABLET(20 MG) BY MOUTH DAILY 90  tablet 2   busPIRone (BUSPAR) 5 MG tablet TAKE 1 TABLET(5 MG) BY MOUTH TWICE DAILY 180 tablet 3   cyclobenzaprine (FLEXERIL) 10 MG tablet Take by mouth as needed for muscle spasms.     pantoprazole (PROTONIX) 40 MG tablet Take 1 tablet (40 mg total) by mouth daily. 90 tablet 1   No current facility-administered medications for this visit.    Allergies  Allergen Reactions   Benazepril Cough   Lisinopril Cough    Family History  Problem Relation Age of Onset   Hypertension Mother    Heart murmur Father    Ovarian cancer Paternal Grandmother    Lupus Sister    Colon cancer Neg Hx    Esophageal cancer Neg Hx    Pancreatic cancer Neg Hx    Rectal cancer Neg Hx    Stomach cancer Neg Hx     Social History   Socioeconomic History   Marital status: Married    Spouse name: Not on file   Number of children: Not on file   Years of education: Not on file   Highest education level: Not on file  Occupational History   Not on file  Tobacco Use   Smoking status: Former    Current packs/day: 0.30    Types: Cigarettes   Smokeless tobacco: Never   Tobacco comments:    black-n-milds   Vaping Use   Vaping status: Some Days  Substance and Sexual Activity   Alcohol use:  Yes    Comment: occasional    Drug use: Yes    Types: Marijuana    Comment: occasional    Sexual activity: Yes    Birth control/protection: Surgical  Other Topics Concern   Not on file  Social History Narrative   Not on file   Social Drivers of Health   Financial Resource Strain: Not on file  Food Insecurity: Not on file  Transportation Needs: Not on file  Physical Activity: Not on file  Stress: Not on file  Social Connections: Not on file  Intimate Partner Violence: Not on file     Constitutional: Denies fever, malaise, fatigue, headache or abrupt weight changes.  Respiratory: Denies difficulty breathing, shortness of breath, cough or sputum production.   Cardiovascular: Denies chest pain, chest  tightness, palpitations or swelling in the hands or feet.  Gastrointestinal: Denies  loss of bladder control, abdominal pain, bloating, constipation, diarrhea or blood in the stool.  GU: Denies  loss of bowel control, urgency, frequency, pain with urination, burning sensation, blood in urine, odor or discharge. Musculoskeletal: Pt reports right side low back pain, right leg pain. Denies decrease in range of motion, difficulty with gait, or joint swelling.  Skin: Denies redness, rashes, lesions or ulcercations.  Neurological: Denies numbness, tingling or weakness or problems with balance and coordination.    No other specific complaints in a complete review of systems (except as listed in HPI above).      Objective:   Physical Exam  BP 122/84 (BP Location: Left Arm, Patient Position: Sitting, Cuff Size: Normal)   Ht 5\' 3"  (1.6 m)   Wt 146 lb 12.8 oz (66.6 kg)   BMI 26.00 kg/m   Wt Readings from Last 3 Encounters:  05/14/23 150 lb 3.2 oz (68.1 kg)  01/05/23 148 lb (67.1 kg)  07/03/22 142 lb 6.4 oz (64.6 kg)    General: Appears her stated age, overweight, in NAD. Cardiovascular: Normal rate and rhythm. S1,S2 noted.  No murmur, rubs or gallops noted.  Pulmonary/Chest: Normal effort and positive vesicular breath sounds. No respiratory distress. No wheezes, rales or ronchi noted.  Musculoskeletal: Normal flexion, extension, rotation and lateral bending of the spine.  Pain with flexion of the spine.  No bony tenderness noted over the lumbar spine.  Pain with palpation over the right SI joint.  Strength 5/5 BLE.  Able to stand on tiptoes and heels.  No difficulty with gait.  Neurological: Alert and oriented.  Positive SLR at 45 degrees.  Coordination normal.    BMET    Component Value Date/Time   NA 141 01/05/2023 1006   K 3.6 01/05/2023 1006   CL 100 01/05/2023 1006   CO2 28 01/05/2023 1006   GLUCOSE 79 01/05/2023 1006   BUN 8 01/05/2023 1006   CREATININE 0.56 01/05/2023 1006    CALCIUM 9.4 01/05/2023 1006   GFRNONAA >60 02/06/2017 0548   GFRAA >60 02/06/2017 0548    Lipid Panel     Component Value Date/Time   CHOL 176 01/05/2023 1006   TRIG 217 (H) 01/05/2023 1006   HDL 78 01/05/2023 1006   CHOLHDL 2.3 01/05/2023 1006   VLDL 49.4 (H) 03/27/2020 1459   LDLCALC 69 01/05/2023 1006    CBC    Component Value Date/Time   WBC 11.8 (H) 01/05/2023 1006   RBC 4.44 01/05/2023 1006   HGB 13.0 01/05/2023 1006   HGB 12.3 05/10/2007 1305   HCT 38.9 01/05/2023 1006   HCT 35.8 05/10/2007 1305  PLT 467 (H) 01/05/2023 1006   PLT 515 (H) 05/10/2007 1305   MCV 87.6 01/05/2023 1006   MCV 82.8 05/10/2007 1305   MCH 29.3 01/05/2023 1006   MCHC 33.4 01/05/2023 1006   RDW 16.0 (H) 01/05/2023 1006   RDW 15.1 (H) 05/10/2007 1305   LYMPHSABS 3.1 04/13/2017 1449   LYMPHSABS 2.4 05/10/2007 1305   MONOABS 0.6 04/13/2017 1449   MONOABS 0.4 05/10/2007 1305   EOSABS 0.1 04/13/2017 1449   EOSABS 0.1 05/10/2007 1305   BASOSABS 0.1 04/13/2017 1449   BASOSABS 0.0 05/10/2007 1305    Hgb A1C Lab Results  Component Value Date   HGBA1C 6.3 (H) 01/05/2023            Assessment & Plan:    Assessment and Plan    Low Back Pain Pain radiating down to the right foot, exacerbated by weight bearing and certain sleeping positions. No improvement with previous Toradol injection, steroids, and muscle relaxers. No neurological deficits noted on examination. Pain onset after lifting a child. -Order lumbar spine x-ray at [facility]. -Repeat prednisone taper for 9 days. -Referral to physical therapy in [place]. -Advise to continue ibuprofen as needed, stretching, and trial of ice application.  General Health Maintenance -Administer influenza vaccine today.       RTC in 1 month for your annual exam Nicki Reaper, NP

## 2023-07-08 ENCOUNTER — Ambulatory Visit
Admission: RE | Admit: 2023-07-08 | Discharge: 2023-07-08 | Disposition: A | Discharge status: Home / Self Care | Payer: 59 | Source: Ambulatory Visit | Attending: Internal Medicine | Admitting: Internal Medicine

## 2023-07-08 DIAGNOSIS — G8929 Other chronic pain: Secondary | ICD-10-CM

## 2023-07-08 NOTE — Telephone Encounter (Signed)
 Requested Prescriptions  Pending Prescriptions Disp Refills   pantoprazole  (PROTONIX ) 40 MG tablet [Pharmacy Med Name: PANTOPRAZOLE  40MG  TABLETS] 90 tablet 1    Sig: TAKE 1 TABLET(40 MG) BY MOUTH DAILY     Gastroenterology: Proton Pump Inhibitors Passed - 07/08/2023  5:19 PM      Passed - Valid encounter within last 12 months    Recent Outpatient Visits           3 days ago Chronic right-sided low back pain with right-sided sciatica   [place] [facility] [place], Angeline ORN, NP   6 months ago Mixed hyperlipidemia   [place] [facility] [place], Angeline ORN, NP   11 months ago Acute bacterial sinusitis   [place] [facility] [place], Angeline ORN, NP   1 year ago Encounter for general adult medical examination with abnormal findings   [place] [facility] [place], Angeline ORN, NP   1 year ago Viral URI with cough   [place] [facility] [place], Angeline ORN, NP       Future Appointments             Tomorrow Anton[place], Angeline ORN, NP [place] [facility]   In 1 month Lelon, Glendia DASEN, PA-C [place] [facility]

## 2023-07-09 ENCOUNTER — Encounter: Payer: BC Managed Care – PPO | Admitting: Internal Medicine

## 2023-07-09 NOTE — Progress Notes (Deleted)
 Subjective:    Patient ID: [name], female    DOB: 1970/10/13, 53 y.o.   MRN: [id number]  HPI  Patient presents to clinic today for her annual exam.   Flu: 06/2023 Tetanus: 08/2018 COVID: Pfizer x 2 Shingrix: 2023 Pap smear: Hysterectomy Mammogram: 02/2022 Colon screening: 02/2023, Cologuard Vision screening: annually Dentist: biannually  Diet: She does eat meat. She consumes fruits and veggies. She does eat some fried foods. She drinks mostly juice, tea, some water. Exercise: None  Review of Systems     Past Medical History:  Diagnosis Date   Allergy    Anxiety    GERD (gastroesophageal reflux disease)    Hyperlipidemia    Hypertension    PVC (premature ventricular contraction) 07/20/2016   Sinus tachycardia 07/20/2016    Current Outpatient Medications  Medication Sig Dispense Refill   acyclovir  ointment (ZOVIRAX ) 5 % Apply 1 Application topically every 3 (three) hours. (Patient not taking: Reported on 07/05/2023) 15 g 0   amLODipine -olmesartan  (AZOR ) 10-40 MG tablet Take 1 tablet by mouth daily. 90 tablet 0   atorvastatin  (LIPITOR) 20 MG tablet TAKE 1 TABLET(20 MG) BY MOUTH DAILY 90 tablet 2   busPIRone  (BUSPAR ) 5 MG tablet TAKE 1 TABLET(5 MG) BY MOUTH TWICE DAILY 180 tablet 3   cyclobenzaprine  (FLEXERIL ) 10 MG tablet Take by mouth as needed for muscle spasms. (Patient not taking: Reported on 07/05/2023)     pantoprazole  (PROTONIX ) 40 MG tablet TAKE 1 TABLET(40 MG) BY MOUTH DAILY 90 tablet 1   predniSONE  (DELTASONE ) 10 MG tablet Take 3 tabs on days 1-3, 2 tabs on days 4-6, 1 tab on days 7-9 18 tablet 0   No current facility-administered medications for this visit.    Allergies  Allergen Reactions   Benazepril  Cough   Lisinopril Cough    Family History  Problem Relation Age of Onset   Hypertension Mother    Heart murmur Father    Ovarian cancer Paternal Grandmother    Lupus Sister    Colon cancer Neg Hx    Esophageal cancer Neg Hx    Pancreatic  cancer Neg Hx    Rectal cancer Neg Hx    Stomach cancer Neg Hx     Social History   Socioeconomic History   Marital status: Married    Spouse name: Not on file   Number of children: Not on file   Years of education: Not on file   Highest education level: Not on file  Occupational History   Not on file  Tobacco Use   Smoking status: Former    Current packs/day: 0.30    Types: Cigarettes   Smokeless tobacco: Never   Tobacco comments:    black-n-milds   Vaping Use   Vaping status: Some Days  Substance and Sexual Activity   Alcohol use: Yes    Comment: occasional    Drug use: Yes    Types: Marijuana    Comment: occasional    Sexual activity: Yes    Birth control/protection: Surgical  Other Topics Concern   Not on file  Social History Narrative   Not on file   Social Drivers of Health   Financial Resource Strain: Not on file  Food Insecurity: Not on file  Transportation Needs: Not on file  Physical Activity: Not on file  Stress: Not on file  Social Connections: Not on file  Intimate Partner Violence: Not on file     Constitutional: Denies fever, malaise, fatigue, headache or abrupt  weight changes.  HEENT: Denies eye pain, eye redness, ear pain, ringing in the ears, wax buildup, runny nose, nasal congestion, bloody nose, or sore throat. Respiratory: Denies difficulty breathing, shortness of breath, cough or sputum production.   Cardiovascular: Denies chest pain, chest tightness, palpitations or swelling in the hands or feet.  Gastrointestinal: Denies abdominal pain, bloating, constipation, diarrhea or blood in the stool.  GU: Denies urgency, frequency, pain with urination, burning sensation, blood in urine, odor or discharge. Musculoskeletal: Patient reports intermittent low back pain.  Denies decrease in range of motion, difficulty with gait, or joint swelling.  Skin: Denies redness, rashes, lesions or ulcercations.  Neurological: Patient reports hot flashes.   Denies dizziness, difficulty with memory, difficulty with speech or problems with balance and coordination.  Psych: Patient has a history of anxiety.  Denies depression, SI/HI.  No other specific complaints in a complete review of systems (except as listed in HPI above).  Objective:   Physical Exam   There were no vitals taken for this visit.  Wt Readings from Last 3 Encounters:  07/05/23 146 lb 12.8 oz (66.6 kg)  05/14/23 150 lb 3.2 oz (68.1 kg)  01/05/23 148 lb (67.1 kg)    General: Appears her stated age, well developed, well nourished in NAD. Skin: Warm, dry and intact. HEENT: Head: normal shape and size; Eyes: sclera white, no icterus, conjunctiva pink, PERRLA and EOMs intact;  Neck:  Neck supple, trachea midline. No masses, lumps or thyromegaly present.  Cardiovascular: Normal rate and rhythm. S1,S2 noted.  No murmur, rubs or gallops noted. No JVD or BLE edema. No carotid bruits noted. Pulmonary/Chest: Normal effort and positive vesicular breath sounds. No respiratory distress. No wheezes, rales or ronchi noted.  Abdomen: Normal bowel sounds.  Musculoskeletal: Strength 5/5 BUE/BLE.  No difficulty with gait.  Neurological: Alert and oriented. Cranial nerves II-XII grossly intact. Coordination normal.  Psychiatric: Mood and affect normal. Behavior is normal. Judgment and thought content normal.     BMET    Component Value Date/Time   NA 141 01/05/2023 1006   K 3.6 01/05/2023 1006   CL 100 01/05/2023 1006   CO2 28 01/05/2023 1006   GLUCOSE 79 01/05/2023 1006   BUN 8 01/05/2023 1006   CREATININE 0.56 01/05/2023 1006   CALCIUM  9.4 01/05/2023 1006   GFRNONAA >60 02/06/2017 0548   GFRAA >60 02/06/2017 0548    Lipid Panel     Component Value Date/Time   CHOL 176 01/05/2023 1006   TRIG 217 (H) 01/05/2023 1006   HDL 78 01/05/2023 1006   CHOLHDL 2.3 01/05/2023 1006   VLDL 49.4 (H) 03/27/2020 1459   LDLCALC 69 01/05/2023 1006    CBC    Component Value Date/Time    WBC 11.8 (H) 01/05/2023 1006   RBC 4.44 01/05/2023 1006   HGB 13.0 01/05/2023 1006   HGB 12.3 05/10/2007 1305   HCT 38.9 01/05/2023 1006   HCT 35.8 05/10/2007 1305   PLT 467 (H) 01/05/2023 1006   PLT 515 (H) 05/10/2007 1305   MCV 87.6 01/05/2023 1006   MCV 82.8 05/10/2007 1305   MCH 29.3 01/05/2023 1006   MCHC 33.4 01/05/2023 1006   RDW 16.0 (H) 01/05/2023 1006   RDW 15.1 (H) 05/10/2007 1305   LYMPHSABS 3.1 04/13/2017 1449   LYMPHSABS 2.4 05/10/2007 1305   MONOABS 0.6 04/13/2017 1449   MONOABS 0.4 05/10/2007 1305   EOSABS 0.1 04/13/2017 1449   EOSABS 0.1 05/10/2007 1305   BASOSABS 0.1 04/13/2017 1449  BASOSABS 0.0 05/10/2007 1305    Hgb A1C Lab Results  Component Value Date   HGBA1C 6.3 (H) 01/05/2023           Assessment & Plan:   Preventative Health Maintenance:  Flu shot UTD Tetanus UTD Encouraged her to get her COVID booster Shingrix vaccine UTD per her report She no longer needs Pap smears Mammogram ordered-she will call to schedule Colon screening UTD Encouraged her to consume a balanced diet and exercise regimen Advised her to see an eye doctor and dentist annually We will check CBC, c-Met, lipid and A1c today  RTC in 6 months, follow-up chronic conditions [name], NP

## 2023-07-13 ENCOUNTER — Telehealth: Payer: Self-pay

## 2023-07-13 NOTE — Telephone Encounter (Signed)
 [name] (Key: [id number]) PA Case ID #: [id number] Rx #: [id number] Need Help? Call us  at [phone] Outcome Approved today by [facility] NCPDP 2017 Your PA request has been approved. Additional information will be provided in the approval communication. (Message [phone]) Authorization Expiration Date: 07/11/2024 Drug cloNIDine  0.1MG /24HR weekly patches ePA cloud logo Form [employer] Electronic PA Form [phone] NCPDP) Original Claim Info 70,MR PENDING FORM REVIEW-USE ALT PROD OR DIS-CUSS W/MD. MED NECESSARY-[phone]-FORMULARY DRUG, CONTACT PRESCRIBER

## 2023-07-13 NOTE — Progress Notes (Signed)
 PA started  [name] (Key: [id number]) Rx #: [id number] Need Help? Call us  at [phone] Status New (Not sent to plan) Drug cloNIDine  0.1MG /24HR weekly patches ePA cloud logo Form [employer] Electronic PA Form [phone] NCPDP) Original Claim Info 70,MR PENDING FORM REVIEW-USE ALT PROD OR DIS-CUSS W/MD. MED NECESSARY-[phone]-FORMULARY DRUG, CONTACT PRESCRIBER

## 2023-07-23 ENCOUNTER — Ambulatory Visit (HOSPITAL_BASED_OUTPATIENT_CLINIC_OR_DEPARTMENT_OTHER): Payer: 59 | Attending: Internal Medicine | Admitting: Physical Therapy

## 2023-07-29 ENCOUNTER — Other Ambulatory Visit: Payer: Self-pay | Admitting: Internal Medicine

## 2023-07-29 NOTE — Telephone Encounter (Signed)
Requested Prescriptions  Pending Prescriptions Disp Refills   amLODipine-olmesartan (AZOR) 10-40 MG tablet [Pharmacy Med Name: AMLODIPINE/OLMES MEDOXOM 10-40MG  T] 90 tablet 0    Sig: TAKE 1 TABLET BY MOUTH DAILY     Cardiovascular: CCB + ARB Combos Failed - 07/29/2023 11:00 AM      Failed - K in normal range and within 180 days    Potassium  Date Value Ref Range Status  01/05/2023 3.6 3.5 - 5.3 mmol/L Final         Failed - Cr in normal range and within 180 days    Creat  Date Value Ref Range Status  01/05/2023 0.56 0.50 - 1.03 mg/dL Final         Failed - Na in normal range and within 180 days    Sodium  Date Value Ref Range Status  01/05/2023 141 135 - 146 mmol/L Final         Passed - Patient is not pregnant      Passed - Last BP in normal range    BP Readings from Last 1 Encounters:  07/05/23 122/84         Passed - Valid encounter within last 6 months    Recent Outpatient Visits           3 weeks ago Chronic right-sided low back pain with right-sided sciatica   [place] [facility] [place], Salvadore Oxford, NP   6 months ago Mixed hyperlipidemia   [place] [facility] [place], Salvadore Oxford, NP   1 year ago Acute bacterial sinusitis   [place] [facility] [place], Salvadore Oxford, NP   1 year ago Encounter for general adult medical examination with abnormal findings   [place] [facility] [place], Salvadore Oxford, NP   1 year ago Viral URI with cough   [facility] [place], Salvadore Oxford, NP       Future Appointments             In 2 weeks Alben Spittle, Evern Bio, PA-C  [facility], [facility]

## 2023-08-04 ENCOUNTER — Other Ambulatory Visit: Payer: Self-pay | Admitting: Internal Medicine

## 2023-08-06 NOTE — Telephone Encounter (Signed)
Requested Prescriptions  Refused Prescriptions Disp Refills   atorvastatin (LIPITOR) 10 MG tablet [Pharmacy Med Name: ATORVASTATIN 10MG  TABLETS] 90 tablet 1    Sig: TAKE 1 TABLET(10 MG) BY MOUTH DAILY AT 6 PM     Cardiovascular:  Antilipid - Statins Failed - 08/06/2023  8:32 AM      Failed - Lipid Panel in normal range within the last 12 months    Cholesterol  Date Value Ref Range Status  01/05/2023 176 <200 mg/dL Final   LDL Cholesterol (Calc)  Date Value Ref Range Status  01/05/2023 69 mg/dL (calc) Final    Comment:    Reference range: <100 . Desirable range <100 mg/dL for primary prevention;   <70 mg/dL for patients with CHD or diabetic patients  with > or = 2 CHD risk factors. Marland Kitchen LDL-C is now calculated using the Martin-Hopkins  calculation, which is a validated novel method providing  better accuracy than the Friedewald equation in the  estimation of LDL-C.  Horald Pollen et al. Lenox Ahr. 1308;657(84): 2061-2068  (http://education.[employer].com/faq/FAQ164)    Direct LDL  Date Value Ref Range Status  03/27/2020 60.0 mg/dL Final    Comment:    Optimal:  <100 mg/dLNear or Above Optimal:  100-129 mg/dLBorderline High:  130-159 mg/dLHigh:  160-189 mg/dLVery High:  >190 mg/dL   HDL  Date Value Ref Range Status  01/05/2023 78 > OR = 50 mg/dL Final   Triglycerides  Date Value Ref Range Status  01/05/2023 217 (H) <150 mg/dL Final    Comment:    . If a non-fasting specimen was collected, consider repeat triglyceride testing on a fasting specimen if clinically indicated.  Perry Mount et al. J. of Clin. Lipidol. 2015;9:129-169. Marland Kitchen          Passed - Patient is not pregnant      Passed - Valid encounter within last 12 months    Recent Outpatient Visits           1 month ago Chronic right-sided low back pain with right-sided sciatica   [facility], Melissa Oxford, NP   7 months ago Mixed hyperlipidemia   [facility]  [place], Melissa Oxford, NP   1 year ago Acute bacterial sinusitis   [place] [facility] [place], Melissa Oxford, NP   1 year ago Encounter for general adult medical examination with abnormal findings   [facility], Melissa Oxford, NP   1 year ago Viral URI with cough   [place] [facility] [place], Melissa Oxford, NP       Future Appointments             In 1 week [name], Evern Bio, PA-C [facility] [employer], [employer]

## 2023-08-17 ENCOUNTER — Encounter: Payer: Self-pay | Admitting: Physician Assistant

## 2023-08-17 ENCOUNTER — Ambulatory Visit: Payer: 59 | Attending: Physician Assistant | Admitting: Physician Assistant

## 2023-08-17 VITALS — BP 124/70 | HR 102 | Ht 63.5 in | Wt 150.0 lb

## 2023-08-17 DIAGNOSIS — I10 Essential (primary) hypertension: Secondary | ICD-10-CM

## 2023-08-17 DIAGNOSIS — R002 Palpitations: Secondary | ICD-10-CM | POA: Insufficient documentation

## 2023-08-17 DIAGNOSIS — R000 Tachycardia, unspecified: Secondary | ICD-10-CM | POA: Diagnosis not present

## 2023-08-17 NOTE — Patient Instructions (Signed)
Medication Instructions:  Your physician recommends that you continue on your current medications as directed. Please refer to the Current Medication list given to you today.  You can message me if you are interested in the Metoprolol   *If you need a refill on your cardiac medications before your next appointment, please call your pharmacy*   Lab Work: TODAY:  TSH  If you have labs (blood work) drawn today and your tests are completely normal, you will receive your results only by: MyChart Message (if you have MyChart) OR A paper copy in the mail If you have any lab test that is abnormal or we need to change your treatment, we will call you to review the results.   Testing/Procedures: None ordered   Follow-Up: At [facility], you and your health needs are our priority.  As part of our continuing mission to provide you with exceptional heart care, we have created designated Provider Care Teams.  These Care Teams include your primary Cardiologist (physician) and Advanced Practice Providers (APPs -  Physician Assistants and Nurse Practitioners) who all work together to provide you with the care you need, when you need it.  We recommend signing up for the patient portal called "MyChart".  Sign up information is provided on this After Visit Summary.  MyChart is used to connect with patients for Virtual Visits (Telemedicine).  Patients are able to view lab/test results, encounter notes, upcoming appointments, etc.  Non-urgent messages can be sent to your provider as well.   To learn more about what you can do with MyChart, go to [email or web address].    Your next appointment:   6 month(s)  Provider:   Christell Constant, MD  or Tereso Newcomer, PA-C         Other Instructions     1st Floor: - Lobby - Registration  - Pharmacy  - Lab - Cafe  2nd Floor: - PV Lab - Diagnostic Testing (echo, CT, nuclear med)  3rd Floor: - Vacant  4th Floor: - TCTS  (cardiothoracic surgery) - AFib Clinic - Structural Heart Clinic - Vascular Surgery  - Vascular Ultrasound  5th Floor: - HeartCare Cardiology (general and EP) - Clinical Pharmacy for coumadin, hypertension, lipid, weight-loss medications, and med management appointments    Valet parking services will be available as well.

## 2023-08-17 NOTE — Assessment & Plan Note (Signed)
The patient's blood pressure is controlled. -Continue amlodipine/olmesartan 10/40 mg once daily.

## 2023-08-17 NOTE — Assessment & Plan Note (Signed)
She has sinus tachycardia noted on her monitor. Her HR has been high for several years. She notes a hx of syncope that was postural in nature and assoc with tachycardia. She has not had this for a long time. She likely had an element of POTS (Postural Orthostatic Hypotension). She does not currently have dizziness or near syncope. 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 We discussed starting on beta-blocker therapy for symptom management. She would like to hold off on this for now. We discussed increasing activity as well. She has not had her thyroid checked. -Check TSH -Walk daily for at least 20 minutes -If symptoms continue, she will contact me. I will start her on Metoprolol succinate 12.5 mg once daily at that time. -Follow up 6 mos

## 2023-08-17 NOTE — Progress Notes (Signed)
Cardiology Office Note:    Date:  08/17/2023  ID:  [name], DOB 02/19/1971, MRN [id number] PCP: Lorre Munroe, NP   HeartCare Providers Cardiologist:  Christell Constant, MD       Patient Profile:      Hypertension  Hyperlipidemia  Palpitations, PACs, PVCs Sinus tachycardia  Monitor 06/18/23: sinus rhythm; Avg HR 110; PACs < 1%, PVCs < 1% ETT 03/05/16: no ischemia TTE 03/05/16: mild focal basal septal hypertrophy, EF 60-65, Gr 1 DD, NL RVSF       [name] is a 53 y.o. female who returns for follow up on palpitations. She was evaluated by Dr. Izora Ribas in 05/2023. Zio monitor showed rare PACs, PVCs and sinus tachycardia with avg HR 110. No med changes were made.   Discussed the use of AI scribe software for clinical note transcription with the patient, who gave verbal consent to proceed.  History of Present Illness   She has been experiencing episodes of heart palpitations for approximately ten years, describing them as sudden racing of the heart, noticeable even at rest. These episodes occur intermittently without significant change in pattern over the years. In the past, she experienced fainting spells associated with these episodes, but there have been no recent occurrences of fainting or lightheadedness. During a previous hospital visit, her potassium levels were found to be extremely low, which she suspects might have been related to her symptoms. No chest pain or shortness of breath is reported, although slight tightness is felt when her heart races. She mentions that her heart rate can reach the 130s, especially when active, but it usually decreases with rest. She has not had her thyroid checked before. 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 She does not engage in regular exercise outside of her daily activities at work.      ROS-See HPI    Studies Reviewed:        Risk  Assessment/Calculations:             Physical Exam:   VS:  BP 124/70   Pulse (!) 102   Ht 5' 3.5" (1.613 m)   Wt 150 lb (68 kg)   SpO2 95%   BMI 26.15 kg/m    Wt Readings from Last 3 Encounters:  08/17/23 150 lb (68 kg)  07/05/23 146 lb 12.8 oz (66.6 kg)  05/14/23 150 lb 3.2 oz (68.1 kg)    Constitutional:      Appearance: Healthy appearance. Not in distress.  Neck:     Thyroid: No thyromegaly.  Pulmonary:     Breath sounds: Normal breath sounds. No wheezing. No rales.  Cardiovascular:     Normal rate. Regular rhythm.     Murmurs: There is no murmur.  Edema:    Peripheral edema absent.        Assessment and Plan:   Assessment & Plan Palpitations She has sinus tachycardia noted on her monitor. Her HR has been high for several years. She notes a hx of syncope that was postural in nature and assoc with tachycardia. She has not had this for a long time. She likely had an element of POTS (Postural Orthostatic Hypotension). She does not currently have dizziness or near syncope. 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 We discussed starting on beta-blocker therapy for symptom management. She would like to hold off on this for now. We discussed increasing activity as well.  She has not had her thyroid checked. -Check TSH -Walk daily for at least 20 minutes -If symptoms continue, she will contact me. I will start her on Metoprolol succinate 12.5 mg once daily at that time. -Follow up 6 mos  Essential hypertension The patient's blood pressure is controlled. -Continue amlodipine/olmesartan 10/40 mg once daily.      Dispo:  Return in about 6 months (around 02/14/2024) for Routine Follow Up, w/ Dr. Izora Ribas, or Tereso Newcomer, PA-C.  Signed, Tereso Newcomer, PA-C

## 2023-08-22 NOTE — Therapy (Incomplete)
OUTPATIENT PHYSICAL THERAPY THORACOLUMBAR EVALUATION   Patient Name: [name] MRN: [id number] DOB:11-13-1970, 53 y.o., female Today's Date: 08/22/2023  END OF SESSION:   Past Medical History:  Diagnosis Date   Allergy    Anxiety    GERD (gastroesophageal reflux disease)    Hyperlipidemia    Hypertension    PVC (premature ventricular contraction) 07/20/2016   Sinus tachycardia 07/20/2016   Past Surgical History:  Procedure Laterality Date   ABDOMINAL HYSTERECTOMY     TUBAL LIGATION     WISDOM TOOTH EXTRACTION     Patient Active Problem List   Diagnosis Date Noted   Palpitations 08/17/2023   Situational anxiety 01/05/2023   Thrombocytosis 08/28/2021   GERD (gastroesophageal reflux disease) 08/29/2018   HLD (hyperlipidemia) 08/29/2018   Menopausal symptoms 08/30/2017   Essential hypertension 01/26/2007    PCP: ***  REFERRING PROVIDER: Lorre Munroe, NP   REFERRING DIAG: M54.41,G89.29 (ICD-10-CM) - Chronic right-sided low back pain with right-sided sciatica   Rationale for Evaluation and Treatment: Rehabilitation  THERAPY DIAG:  No diagnosis found.  ONSET DATE: ***  SUBJECTIVE:                                                                                                                                                                                           SUBJECTIVE STATEMENT: Pain began when liftign a child at work. The pain, initially localized to the lower back, has now radiated down to the right foot, making it difficult for the patient to put pressure on it. The pain is described as sharp, particularly in the ankle and foot, but no longer felt in the lower back. There is no associated numbness, tingling  urgent care center in late October, where she received a Toradol injection, a steroid dose pack, and a muscle relaxer (Cyclobenzaprine). The patient reports temporary relief for about a week, but the pain returned  Disturbed sleep Receive any  exercises?  Pt saw MD on 12/30.  R SI joint pain, possible sciatica.  MD ordered x rays and ordered prednisone.   Saw MD for tachycardia on 2/11  PERTINENT HISTORY:  Anxiety HTN tachycardia hx of syncope that was postural in nature and assoc with tachycardia.  PAIN:  Are you having pain? {OPRCPAIN:27236}  PRECAUTIONS: {Therapy precautions:24002}  RED FLAGS: {PT Red Flags:29287}   WEIGHT BEARING RESTRICTIONS: {Yes ***/No:24003}  FALLS:  Has patient fallen in last 6 months? {fallsyesno:27318}  LIVING ENVIRONMENT: Lives with: {OPRC lives with:25569::"lives with their family"} Lives in: {Lives in:25570} Stairs: {opstairs:27293} Has following equipment at home: {Assistive devices:23999}  OCCUPATION: ***  PLOF: {PLOF:24004}  PATIENT GOALS: ***  NEXT MD VISIT: ***  OBJECTIVE:  Note: Objective measures were completed at Evaluation unless otherwise noted.  DIAGNOSTIC FINDINGS:  Lumbar x rays on 07/08/23: FINDINGS: There is no evidence of lumbar spine fracture. Mild levocurvature of spine. Minimal narrow intervertebral spaces in the mid lumbar spine. Minimal anterior spurring noted at L2, L3 and L4.   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  PATIENT SURVEYS:  {rehab surveys:24030}  COGNITION: Overall cognitive status: {cognition:24006}     SENSATION: {sensation:27233}  MUSCLE LENGTH: Hamstrings: Right *** deg; Left *** deg Maisie Fus test: Right *** deg; Left *** deg  POSTURE: {posture:25561}  PALPATION: ***  LUMBAR ROM:   AROM eval  Flexion   Extension   Right lateral flexion   Left lateral flexion   Right rotation   Left rotation    (Blank rows = not tested)  LOWER EXTREMITY ROM:     {AROM/PROM:27142}  Right eval Left eval  Hip flexion    Hip extension    Hip abduction    Hip adduction    Hip internal rotation    Hip external rotation    Knee flexion    Knee extension    Ankle dorsiflexion    Ankle plantarflexion     Ankle inversion    Ankle eversion     (Blank rows = not tested)  LOWER EXTREMITY MMT:    MMT Right eval Left eval  Hip flexion    Hip extension    Hip abduction    Hip adduction    Hip internal rotation    Hip external rotation    Knee flexion    Knee extension    Ankle dorsiflexion    Ankle plantarflexion    Ankle inversion    Ankle eversion     (Blank rows = not tested)  LUMBAR SPECIAL TESTS:  Supine SLR:  FUNCTIONAL TESTS:  {Functional tests:24029}  GAIT: Distance walked: *** Assistive device utilized: {Assistive devices:23999} Level of assistance: {Levels of assistance:24026} Comments: ***  TREATMENT DATE: ***                                                                                                                                 PATIENT EDUCATION:  Education details: *** Person educated: {Person educated:25204} Education method: {Education Method:25205} Education comprehension: {Education Comprehension:25206}  HOME EXERCISE PROGRAM: ***  ASSESSMENT:  CLINICAL IMPRESSION: Patient is a *** y.o. *** who was seen today for physical therapy evaluation and treatment for ***.   OBJECTIVE IMPAIRMENTS: {opptimpairments:25111}.   ACTIVITY LIMITATIONS: {activitylimitations:27494}  PARTICIPATION LIMITATIONS: {participationrestrictions:25113}  PERSONAL FACTORS: {Personal factors:25162} are also affecting patient's functional outcome.   REHAB POTENTIAL: {rehabpotential:25112}  CLINICAL DECISION MAKING: {clinical decision making:25114}  EVALUATION COMPLEXITY: {Evaluation complexity:25115}   GOALS: Goals reviewed with patient? {yes/no:20286}  SHORT TERM GOALS: Target date: ***  *** Baseline: Goal status: INITIAL  2.  *** Baseline:  Goal status: INITIAL  3.  *** Baseline:  Goal  status: INITIAL  4.  *** Baseline:  Goal status: INITIAL  5.  *** Baseline:  Goal status: INITIAL  6.  *** Baseline:  Goal status: INITIAL  LONG  TERM GOALS: Target date: ***  *** Baseline:  Goal status: INITIAL  2.  *** Baseline:  Goal status: INITIAL  3.  *** Baseline:  Goal status: INITIAL  4.  *** Baseline:  Goal status: INITIAL  5.  *** Baseline:  Goal status: INITIAL  6.  *** Baseline:  Goal status: INITIAL  PLAN:  PT FREQUENCY: {rehab frequency:25116}  PT DURATION: {rehab duration:25117}  PLANNED INTERVENTIONS: {rehab planned interventions:25118::"97110-Therapeutic exercises","97530- Therapeutic [phone]- Neuromuscular re-education","97535- Self GEXB","28413- Manual therapy"}.  PLAN FOR NEXT SESSION: ***   Aaron Edelman, PT 08/22/2023, 8:36 AM

## 2023-08-23 ENCOUNTER — Ambulatory Visit (HOSPITAL_BASED_OUTPATIENT_CLINIC_OR_DEPARTMENT_OTHER): Payer: 59 | Attending: Internal Medicine | Admitting: Physical Therapy

## 2023-09-07 ENCOUNTER — Encounter: Payer: Self-pay | Admitting: Internal Medicine

## 2023-09-07 DIAGNOSIS — H04123 Dry eye syndrome of bilateral lacrimal glands: Secondary | ICD-10-CM

## 2023-09-07 DIAGNOSIS — H04203 Unspecified epiphora, bilateral lacrimal glands: Secondary | ICD-10-CM

## 2023-09-10 ENCOUNTER — Telehealth: Payer: Self-pay

## 2023-09-10 NOTE — Telephone Encounter (Signed)
 Are we able to send this referral to somewhere else?  [facility] says that they do not see patients for this concern.  She is only seeing [facility] but she is requesting a second opinion.

## 2023-09-10 NOTE — Telephone Encounter (Signed)
 Copied from CRM [id number]. Topic: Referral - Status >> Sep 09, 2023  3:49 PM Turkey B wrote: Reason for CRM: jean from [facility], received referral for pt. She states they no longer see pt's for dry eye or teary eye anything like that and the Dr that was there to do that is no longer there

## 2023-10-03 ENCOUNTER — Other Ambulatory Visit: Payer: Self-pay | Admitting: Internal Medicine

## 2023-10-05 NOTE — Telephone Encounter (Signed)
 Requested medication (s) are due for refill today: historical medication   Requested medication (s) are on the active medication list: yes   Last refill:  na   Future visit scheduled: no   Notes to clinic:  historical medication  do you want to order Rx?     Requested Prescriptions  Pending Prescriptions Disp Refills   busPIRone (BUSPAR) 5 MG tablet [Pharmacy Med Name: BUSPIRONE 5MG  TABLETS] 180 tablet     Sig: TAKE 1 TABLET(5 MG) BY MOUTH TWICE DAILY     Psychiatry: Anxiolytics/Hypnotics - Non-controlled Failed - 10/05/2023 12:44 PM      Failed - Valid encounter within last 12 months    Recent Outpatient Visits   None

## 2023-10-07 ENCOUNTER — Encounter: Admitting: Internal Medicine

## 2023-10-28 ENCOUNTER — Encounter: Admitting: Internal Medicine

## 2023-11-19 ENCOUNTER — Other Ambulatory Visit: Payer: Self-pay | Admitting: Internal Medicine

## 2023-11-22 NOTE — Telephone Encounter (Signed)
 Requested Prescriptions  Pending Prescriptions Disp Refills   amLODipine -olmesartan  (AZOR ) 10-40 MG tablet [Pharmacy Med Name: AMLODIPINE /OLMES MEDOXOM 10-40MG  T] 90 tablet 0    Sig: TAKE 1 TABLET BY MOUTH DAILY     Cardiovascular: CCB + ARB Combos Failed - 11/22/2023  2:29 PM      Failed - K in normal range and within 180 days    Potassium  Date Value Ref Range Status  01/05/2023 3.6 3.5 - 5.3 mmol/L Final         Failed - Cr in normal range and within 180 days    Creat  Date Value Ref Range Status  01/05/2023 0.56 0.50 - 1.03 mg/dL Final         Failed - Na in normal range and within 180 days    Sodium  Date Value Ref Range Status  01/05/2023 141 135 - 146 mmol/L Final         Failed - Valid encounter within last 6 months    Recent Outpatient Visits   None            Passed - Patient is not pregnant      Passed - Last BP in normal range    BP Readings from Last 1 Encounters:  08/17/23 124/70

## 2023-12-07 ENCOUNTER — Encounter: Admitting: Internal Medicine

## 2023-12-21 ENCOUNTER — Encounter: Payer: Self-pay | Admitting: Internal Medicine

## 2023-12-21 ENCOUNTER — Ambulatory Visit (INDEPENDENT_AMBULATORY_CARE_PROVIDER_SITE_OTHER): Admitting: Internal Medicine

## 2023-12-21 VITALS — BP 118/78 | Ht 63.5 in | Wt 142.6 lb

## 2023-12-21 DIAGNOSIS — R739 Hyperglycemia, unspecified: Secondary | ICD-10-CM

## 2023-12-21 DIAGNOSIS — H04123 Dry eye syndrome of bilateral lacrimal glands: Secondary | ICD-10-CM | POA: Diagnosis not present

## 2023-12-21 DIAGNOSIS — Z0001 Encounter for general adult medical examination with abnormal findings: Secondary | ICD-10-CM

## 2023-12-21 DIAGNOSIS — E782 Mixed hyperlipidemia: Secondary | ICD-10-CM | POA: Diagnosis not present

## 2023-12-21 DIAGNOSIS — R682 Dry mouth, unspecified: Secondary | ICD-10-CM

## 2023-12-21 NOTE — Progress Notes (Signed)
 Subjective:    Patient ID: [name], female    DOB: 1971-06-07, 53 y.o.   MRN: [id number]  HPI  Patient presents to clinic today for her annual exam.   Flu: 06/2023 Tetanus: 08/2018 COVID: Pfizer x 2 Shingrix: 2023, [facility] Pap smear: Hysterectomy Mammogram: 02/20[facility] Colon screening: 02/2023, Cologuard Vision screening: annually Dentist: biannually  Diet: She does eat meat. She consumes fruits and veggies. She does eat some fried foods. She drinks mostly juice, tea, some water. Exercise: None  Review of Systems     Past Medical History:  Diagnosis Date   Allergy    Anxiety    GERD (gastroesophageal reflux disease)    Hyperlipidemia    Hypertension    PVC (premature ventricular contraction) 07/20/2016   Sinus tachycardia 07/20/2016    Current Outpatient Medications  Medication Sig Dispense Refill   acyclovir  ointment (ZOVIRAX ) 5 % Apply 1 Application topically every 3 (three) hours as needed (for breakouts).     amLODipine -olmesartan  (AZOR ) 10-40 MG tablet TAKE 1 TABLET BY MOUTH DAILY 90 tablet 0   atorvastatin  (LIPITOR) 20 MG tablet TAKE 1 TABLET(20 MG) BY MOUTH DAILY 90 tablet 2   busPIRone  (BUSPAR ) 5 MG tablet TAKE 1 TABLET(5 MG) BY MOUTH TWICE DAILY 180 tablet 0   pantoprazole  (PROTONIX ) 40 MG tablet TAKE 1 TABLET(40 MG) BY MOUTH DAILY 90 tablet 1   No current facility-administered medications for this visit.    Allergies  Allergen Reactions   Benazepril  Cough   Amlodipine  Besylate Cough    Other Reaction(s): Cough   Lisinopril Cough    Family History  Problem Relation Age of Onset   Hypertension Mother    Heart murmur Father    Ovarian cancer Paternal Grandmother    Lupus Sister    Colon cancer Neg Hx    Esophageal cancer Neg Hx    Pancreatic cancer Neg Hx    Rectal cancer Neg Hx    Stomach cancer Neg Hx     Social History   Socioeconomic History   Marital status: Married    Spouse name: Not on file    Number of children: Not on file   Years of education: Not on file   Highest education level: Not on file  Occupational History   Not on file  Tobacco Use   Smoking status: Former    Current packs/day: 0.30    Types: Cigarettes   Smokeless tobacco: Never   Tobacco comments:    black-n-milds   Vaping Use   Vaping status: Some Days  Substance and Sexual Activity   Alcohol use: Yes    Comment: occasional    Drug use: Yes    Types: Marijuana    Comment: occasional    Sexual activity: Yes    Birth control/protection: Surgical  Other Topics Concern   Not on file  Social History Narrative   Not on file   Social Drivers of Health   Financial Resource Strain: Not on file  Food Insecurity: Not on file  Transportation Needs: Not on file  Physical Activity: Not on file  Stress: Not on file  Social Connections: Not on file  Intimate Partner Violence: Not on file     Constitutional: Denies fever, malaise, fatigue, headache or abrupt weight changes.  HEENT: Pt reports right eye watering, dry mouth. Denies eye pain, eye redness, ear pain, ringing in the ears, wax buildup, runny nose, nasal congestion, bloody nose, or sore throat. Respiratory: Denies difficulty  breathing, shortness of breath, cough or sputum production.   Cardiovascular: Patient reports intermittent swelling in legs.  Denies chest pain, chest tightness, palpitations or swelling in the hands.  Gastrointestinal: Denies abdominal pain, bloating, constipation, diarrhea or blood in the stool.  GU: Denies urgency, frequency, pain with urination, burning sensation, blood in urine, odor or discharge. Musculoskeletal: Denies decrease in range of motion, difficulty with gait, muscle pain or joint pain and swelling.  Skin: Denies redness, rashes, lesions or ulcercations.  Neurological: Patient reports hot flashes.  Denies dizziness, difficulty with memory, difficulty with speech or problems with balance and coordination.  Psych:  Patient has a history of anxiety.  Denies depression, SI/HI.  No other specific complaints in a complete review of systems (except as listed in HPI above).  Objective:   Physical Exam  BP 118/78 (BP Location: Left Arm, Patient Position: Sitting, Cuff Size: Normal)   Ht 5' 3.5 (1.613 m)   Wt 142 lb 9.6 oz (64.7 kg)   BMI 24.86 kg/m    Wt Readings from Last 3 Encounters:  08/17/23 150 lb (68 kg)  07/05/23 146 lb 12.8 oz (66.6 kg)  05/14/23 150 lb 3.2 oz (68.1 kg)    General: Appears her stated age, well developed, well nourished in NAD. Skin: Warm, dry and intact. HEENT: Head: normal shape and size; Eyes: sclera white, no icterus, conjunctiva pink, PERRLA and EOMs intact;  Neck:  Neck supple, trachea midline. No masses, lumps or thyromegaly present.  Cardiovascular: Normal rate and rhythm. S1,S2 noted.  No murmur, rubs or gallops noted. No JVD or BLE edema. No carotid bruits noted. Pulmonary/Chest: Normal effort and positive vesicular breath sounds. No respiratory distress. No wheezes, rales or ronchi noted.  Abdomen: Normal bowel sounds.  Musculoskeletal: Strength 5/5 BUE/BLE.  No difficulty with gait.  Neurological: Alert and oriented. Cranial nerves II-XII grossly intact. Coordination normal.  Psychiatric: Mood and affect normal. Behavior is normal. Judgment and thought content normal.     BMET    Component Value Date/Time   NA 141 01/05/2023 1006   K 3.6 01/05/2023 1006   CL 100 01/05/2023 1006   CO2 28 01/05/2023 1006   GLUCOSE 79 01/05/2023 1006   BUN 8 01/05/2023 1006   CREATININE 0.56 01/05/2023 1006   CALCIUM  9.4 01/05/2023 1006   GFRNONAA >60 02/06/2017 0548   GFRAA >60 02/06/2017 0548    Lipid Panel     Component Value Date/Time   CHOL 176 01/05/2023 1006   TRIG 217 (H) 01/05/2023 1006   HDL 78 01/05/2023 1006   CHOLHDL 2.3 01/05/2023 1006   VLDL 49.4 (H) 03/27/2020 1459   LDLCALC 69 01/05/2023 1006    CBC    Component Value Date/Time   WBC 11.8  (H) 01/05/2023 1006   RBC 4.44 01/05/2023 1006   HGB 13.0 01/05/2023 1006   HGB 12.3 05/10/2007 1305   HCT 38.9 01/05/2023 1006   HCT 35.8 05/10/2007 1305   PLT 467 (H) 01/05/2023 1006   PLT 515 (H) 05/10/2007 1305   MCV 87.6 01/05/2023 1006   MCV 82.8 05/10/2007 1305   MCH 29.3 01/05/2023 1006   MCHC 33.4 01/05/2023 1006   RDW 16.0 (H) 01/05/2023 1006   RDW 15.1 (H) 05/10/2007 1305   LYMPHSABS 3.1 04/13/2017 1449   LYMPHSABS 2.4 05/10/2007 1305   MONOABS 0.6 04/13/2017 1449   MONOABS 0.4 05/10/2007 1305   EOSABS 0.1 04/13/2017 1449   EOSABS 0.1 05/10/2007 1305   BASOSABS 0.1 04/13/2017 1449  BASOSABS 0.0 05/10/2007 1305    Hgb A1C Lab Results  Component Value Date   HGBA1C 6.3 (H) 01/05/2023           Assessment & Plan:   Preventative Health Maintenance:  Encouraged her to get a flu shot the fall Tetanus UTD Encouraged her to get her COVID booster Shingrix vaccine UTD She no longer needs Pap smears Mammogram ordered-she will call to schedule Colon screening UTD Encouraged her to consume a balanced diet and exercise regimen Advised her to see an eye doctor and dentist annually We will check CBC, c-Met, lipid and A1c today  Dry eyes, dry mouth:  Will check Sjogren's antibodies  RTC in 6 months, follow-up chronic conditions Helayne Lo, NP

## 2023-12-21 NOTE — Patient Instructions (Signed)

## 2023-12-22 ENCOUNTER — Ambulatory Visit: Payer: Self-pay | Admitting: Internal Medicine

## 2023-12-22 DIAGNOSIS — H04129 Dry eye syndrome of unspecified lacrimal gland: Secondary | ICD-10-CM

## 2023-12-22 DIAGNOSIS — H579 Unspecified disorder of eye and adnexa: Secondary | ICD-10-CM

## 2023-12-23 LAB — COMPREHENSIVE METABOLIC PANEL WITH GFR
AG Ratio: 1.5 (calc) (ref 1.0–2.5)
ALT: 29 U/L (ref 6–29)
AST: 38 U/L — ABNORMAL HIGH (ref 10–35)
Albumin: 4.8 g/dL (ref 3.6–5.1)
Alkaline phosphatase (APISO): 122 U/L (ref 37–153)
BUN: 11 mg/dL (ref 7–25)
CO2: 26 mmol/L (ref 20–32)
Calcium: 9.9 mg/dL (ref 8.6–10.4)
Chloride: 101 mmol/L (ref 98–110)
Creat: 0.67 mg/dL (ref 0.50–1.03)
Globulin: 3.2 g/dL (ref 1.9–3.7)
Glucose, Bld: 99 mg/dL (ref 65–99)
Potassium: 4.3 mmol/L (ref 3.5–5.3)
Sodium: 139 mmol/L (ref 135–146)
Total Bilirubin: 0.5 mg/dL (ref 0.2–1.2)
Total Protein: 8 g/dL (ref 6.1–8.1)
eGFR: 104 mL/min/{1.73_m2} (ref >=60)

## 2023-12-23 LAB — HEMOGLOBIN A1C
Hgb A1c MFr Bld: 6.2 % — ABNORMAL HIGH (ref <5.7)
Mean Plasma Glucose: 131 mg/dL
eAG (mmol/L): 7.3 mmol/L

## 2023-12-23 LAB — LIPID PANEL
Cholesterol: 174 mg/dL (ref <200)
HDL: 75 mg/dL (ref >=50)
LDL Cholesterol (Calc): 73 mg/dL
Non-HDL Cholesterol (Calc): 99 mg/dL (ref <130)
Total CHOL/HDL Ratio: 2.3 (calc) (ref <5.0)
Triglycerides: 189 mg/dL — ABNORMAL HIGH (ref <150)

## 2023-12-23 LAB — SJOGREN'S SYNDROME ANTIBODS(SSA + SSB)
SSA (Ro) (ENA) Antibody, IgG: <1 AI
SSB (La) (ENA) Antibody, IgG: <1 AI

## 2023-12-23 LAB — CBC
HCT: 40.8 % (ref 35.0–45.0)
Hemoglobin: 13.1 g/dL (ref 11.7–15.5)
MCH: 29.4 pg (ref 27.0–33.0)
MCHC: 32.1 g/dL (ref 32.0–36.0)
MCV: 91.5 fL (ref 80.0–100.0)
MPV: 9.2 fL (ref 7.5–12.5)
Platelets: 489 10*3/uL — ABNORMAL HIGH (ref 140–400)
RBC: 4.46 10*6/uL (ref 3.80–5.10)
RDW: 14.9 % (ref 11.0–15.0)
WBC: 10.8 10*3/uL (ref 3.8–10.8)

## 2023-12-28 ENCOUNTER — Encounter (HOSPITAL_COMMUNITY): Payer: Self-pay

## 2023-12-28 ENCOUNTER — Ambulatory Visit (HOSPITAL_COMMUNITY): Payer: Self-pay

## 2023-12-28 ENCOUNTER — Other Ambulatory Visit: Payer: Self-pay

## 2023-12-28 ENCOUNTER — Emergency Department (HOSPITAL_COMMUNITY)
Admission: EM | Admit: 2023-12-28 | Discharge: 2023-12-28 | Disposition: A | Discharge status: Against Medical Advice | Attending: Emergency Medicine | Admitting: Emergency Medicine

## 2023-12-28 ENCOUNTER — Ambulatory Visit (INDEPENDENT_AMBULATORY_CARE_PROVIDER_SITE_OTHER)

## 2023-12-28 ENCOUNTER — Emergency Department (HOSPITAL_COMMUNITY)

## 2023-12-28 ENCOUNTER — Ambulatory Visit (HOSPITAL_COMMUNITY)
Admission: EM | Admit: 2023-12-28 | Discharge: 2023-12-28 | Disposition: A | Discharge status: Home / Self Care | Attending: Family Medicine | Admitting: Family Medicine

## 2023-12-28 DIAGNOSIS — R079 Chest pain, unspecified: Secondary | ICD-10-CM

## 2023-12-28 DIAGNOSIS — Z5321 Procedure and treatment not carried out due to patient leaving prior to being seen by health care provider: Secondary | ICD-10-CM | POA: Diagnosis not present

## 2023-12-28 DIAGNOSIS — R1013 Epigastric pain: Secondary | ICD-10-CM | POA: Insufficient documentation

## 2023-12-28 DIAGNOSIS — R0789 Other chest pain: Secondary | ICD-10-CM | POA: Diagnosis present

## 2023-12-28 LAB — CBC WITH DIFFERENTIAL/PLATELET
Abs Immature Granulocytes: 0.1 10*3/uL — ABNORMAL HIGH (ref 0.00–0.07)
Basophils Absolute: 0 10*3/uL (ref 0.0–0.1)
Basophils Relative: 0 %
Eosinophils Absolute: 0 10*3/uL (ref 0.0–0.5)
Eosinophils Relative: 0 %
HCT: 38.7 % (ref 36.0–46.0)
Hemoglobin: 12.7 g/dL (ref 12.0–15.0)
Immature Granulocytes: 1 %
Lymphocytes Relative: 19 %
Lymphs Abs: 2.2 10*3/uL (ref 0.7–4.0)
MCH: 30 pg (ref 26.0–34.0)
MCHC: 32.8 g/dL (ref 30.0–36.0)
MCV: 91.3 fL (ref 80.0–100.0)
Monocytes Absolute: 0.5 10*3/uL (ref 0.1–1.0)
Monocytes Relative: 5 %
Neutro Abs: 8.9 10*3/uL — ABNORMAL HIGH (ref 1.7–7.7)
Neutrophils Relative %: 75 %
Platelets: 446 10*3/uL — ABNORMAL HIGH (ref 150–400)
RBC: 4.24 MIL/uL (ref 3.87–5.11)
RDW: 14.7 % (ref 11.5–15.5)
WBC: 11.8 10*3/uL — ABNORMAL HIGH (ref 4.0–10.5)
nRBC: 0 % (ref 0.0–0.2)

## 2023-12-28 LAB — COMPREHENSIVE METABOLIC PANEL WITH GFR
ALT: 30 U/L (ref 0–44)
AST: 36 U/L (ref 15–41)
Albumin: 4.3 g/dL (ref 3.5–5.0)
Alkaline Phosphatase: 94 U/L (ref 38–126)
Anion gap: 15 (ref 5–15)
BUN: 9 mg/dL (ref 6–20)
CO2: 21 mmol/L — ABNORMAL LOW (ref 22–32)
Calcium: 9.8 mg/dL (ref 8.9–10.3)
Chloride: 103 mmol/L (ref 98–111)
Creatinine, Ser: 0.61 mg/dL (ref 0.44–1.00)
GFR, Estimated: >60 mL/min (ref >60)
Glucose, Bld: 104 mg/dL — ABNORMAL HIGH (ref 70–99)
Potassium: 3.5 mmol/L (ref 3.5–5.1)
Sodium: 139 mmol/L (ref 135–145)
Total Bilirubin: 0.8 mg/dL (ref 0.0–1.2)
Total Protein: 8.3 g/dL — ABNORMAL HIGH (ref 6.5–8.1)

## 2023-12-28 LAB — TROPONIN I (HIGH SENSITIVITY): Troponin I (High Sensitivity): 3 ng/L (ref <18)

## 2023-12-28 LAB — LIPASE, BLOOD: Lipase: 39 U/L (ref 11–51)

## 2023-12-28 NOTE — ED Notes (Signed)
 Patient is being discharged from the Urgent Care and sent to the Emergency Department via private vehicle . Per Dr. Rolinda, patient is in need of higher level of care due to chest pain. Patient is aware and verbalizes understanding of plan of care.  Vitals:   12/28/23 1140  BP: 131/86  Pulse: 69  Resp: 16  Temp: 98.3 F (36.8 C)  SpO2: 98%

## 2023-12-28 NOTE — ED Provider Triage Note (Signed)
 Emergency Medicine Provider Triage Evaluation Note  [name] , a 53 y.o. female  was evaluated in triage.  Pt complains of epigastric abdominal pain that radiates along her left chest wall to the left shoulder.  Symptoms started around 10 AM prior to her team's meeting.  Review of Systems  Positive: As above Negative: As above  Physical Exam  BP 135/88 (BP Location: Right Arm)   Pulse (!) 104   Temp 98 F (36.7 C)   Resp 16   Ht 5' 3.5 (1.613 m)   Wt 64.4 kg   SpO2 99%   BMI 24.76 kg/m  Gen:   Awake, no distress   Resp:  Normal effort  MSK:   Moves extremities without difficulty  Other:  No tenderness over the abdomen.  Abdomen is soft.  Medical Decision Making  Medically screening exam initiated at 1:20 PM.  Appropriate orders placed.  [name] was informed that the remainder of the evaluation will be completed by another provider, this initial triage assessment does not replace that evaluation, and the importance of remaining in the ED until their evaluation is complete.     Hildegard Loge, PA-C 12/28/23 1322

## 2023-12-28 NOTE — ED Triage Notes (Addendum)
 Patient c/o constant left chest pain under the left breast that radiates to the back x 1 hour. Patient denies any SOB, but states she is having slight nausea.

## 2023-12-28 NOTE — ED Provider Notes (Signed)
 [facility]   [id number] 12/28/23 Arrival Time: 1135  ASSESSMENT & PLAN:  1. Chest pain, unspecified type    Limited diagnostic resources available here for acute CP less than 2 hours duration. I have personally viewed and independently interpreted the imaging studies ordered this visit. CXR: no acute changes; no pneumothorax.  ECG: Performed today and interpreted by me: sinus tachycardia; no STEMI.  To ED via POV; stable upon discharge,   Reviewed expectations re: course of current medical issues. Questions answered. Outlined signs and symptoms indicating need for more acute intervention. Patient verbalized understanding. After Visit Summary given.   SUBJECTIVE:  History from: patient. [name] is a 53 y.o. female who presents with complaint of lower left chest pain that radiates around to her back now; abrupt onset; approx 1-2 h ago. With assoc nausea; no emesis. Denies assoc SOB/diaphoresis. Pain is persistent and sharp in nature. Denies injury/trauma. Hurts with certain movements but feels deeper inside. Denies numbness/tingling/rash over area. No tx PTA. Ambulatory without assistance.  Denies recreational drug use. Smokes THC and vapes. Social History   Tobacco Use  Smoking Status Former   Current packs/day: 0.30   Types: Cigarettes  Smokeless Tobacco Never  Tobacco Comments   black-n-milds    Social History   Substance and Sexual Activity  Alcohol Use Yes   Comment: occasional      OBJECTIVE:  Vitals:   12/28/23 1140  BP: 131/86  Pulse: 69  Resp: 16  Temp: 98.3 F (36.8 C)  TempSrc: Oral  SpO2: 98%    General appearance: alert, oriented, no acute distress but does appear uncomfortable and holding L chest Eyes: PERRLA; EOMI; conjunctivae normal HENT: normocephalic; atraumatic Neck: supple with FROM Lungs: without labored respirations; speaks full sentences without difficulty; CTAB Heart: regular Chest Wall: without  reproducible tenderness to palpation Abdomen: soft, non-tender; no guarding or rebound tenderness Extremities: without edema; without calf swelling or tenderness; symmetrical without gross deformities Skin: warm and dry; without rash or lesions Neuro: normal gait Psychological: alert and cooperative; normal mood and affect   Imaging: No results found.   Allergies  Allergen Reactions   Benazepril  Cough   Amlodipine  Besylate Cough    Other Reaction(s): Cough   Lisinopril Cough    Past Medical History:  Diagnosis Date   Allergy    Anxiety    GERD (gastroesophageal reflux disease)    Hyperlipidemia    Hypertension    PVC (premature ventricular contraction) 07/20/2016   Sinus tachycardia 07/20/2016   Social History   Socioeconomic History   Marital status: Married    Spouse name: Not on file   Number of children: Not on file   Years of education: Not on file   Highest education level: Not on file  Occupational History   Not on file  Tobacco Use   Smoking status: Former    Current packs/day: 0.30    Types: Cigarettes   Smokeless tobacco: Never   Tobacco comments:    black-n-milds   Vaping Use   Vaping status: Some Days   Substances: Flavoring  Substance and Sexual Activity   Alcohol use: Yes    Comment: occasional    Drug use: Yes    Types: Marijuana    Comment: occasional    Sexual activity: Yes    Birth control/protection: Surgical  Other Topics Concern   Not on file  Social History Narrative   Not on file   Social Drivers of Corporate investment banker  Strain: Not on file  Food Insecurity: Not on file  Transportation Needs: Not on file  Physical Activity: Not on file  Stress: Not on file  Social Connections: Not on file  Intimate Partner Violence: Not on file   Family History  Problem Relation Age of Onset   Hypertension Mother    Heart murmur Father    Ovarian cancer Paternal Grandmother    Lupus Sister    Colon cancer Neg Hx    Esophageal  cancer Neg Hx    Pancreatic cancer Neg Hx    Rectal cancer Neg Hx    Stomach cancer Neg Hx    Past Surgical History:  Procedure Laterality Date   ABDOMINAL HYSTERECTOMY     TUBAL LIGATION     WISDOM TOOTH EXTRACTION        Rolinda Rogue, MD 12/28/23 [phone]

## 2023-12-28 NOTE — ED Notes (Signed)
Pt called for room.. no answer x4.

## 2023-12-28 NOTE — ED Triage Notes (Signed)
 Reports pain in left/chest left upper abd that radiates around to her back.  Reports some nausea.  Denies pain in jaw or arm.

## 2024-01-03 MED ORDER — ESTRADIOL 0.5 MG PO TABS: 0.5000 mg | ORAL_TABLET | Freq: Every day | ORAL | 1 refills | Status: AC

## 2024-01-03 NOTE — Addendum Note (Signed)
 Addended by: ANTONETTE ANGELINE ORN on: 01/03/2024 01:58 PM   Modules accepted: Orders

## 2024-02-23 ENCOUNTER — Other Ambulatory Visit: Payer: Self-pay | Admitting: Internal Medicine

## 2024-02-24 NOTE — Telephone Encounter (Signed)
 Labs in date  Requested Prescriptions  Pending Prescriptions Disp Refills   amLODipine -olmesartan  (AZOR ) 10-40 MG tablet [Pharmacy Med Name: AMLODIPINE /OLMES MEDOXOM 10-40MG  T] 90 tablet 0    Sig: TAKE 1 TABLET BY MOUTH DAILY     Cardiovascular: CCB + ARB Combos Failed - 02/24/2024  1:43 PM      Failed - Last BP in normal range    BP Readings from Last 1 Encounters:  12/28/23 (!) 126/91         Passed - K in normal range and within 180 days    Potassium  Date Value Ref Range Status  12/28/2023 3.5 3.5 - 5.1 mmol/L Final         Passed - Cr in normal range and within 180 days    Creat  Date Value Ref Range Status  12/21/2023 0.67 0.50 - 1.03 mg/dL Final   Creatinine, Ser  Date Value Ref Range Status  12/28/2023 0.61 0.44 - 1.00 mg/dL Final         Passed - Na in normal range and within 180 days    Sodium  Date Value Ref Range Status  12/28/2023 139 135 - 145 mmol/L Final         Passed - Patient is not pregnant      Passed - Valid encounter within last 6 months    Recent Outpatient Visits           2 months ago Encounter for general adult medical examination with abnormal findings   [place] [facility] [place], Angeline ORN, NP               atorvastatin  (LIPITOR) 20 MG tablet [Pharmacy Med Name: ATORVASTATIN  20MG  TABLETS] 90 tablet 2    Sig: TAKE 1 TABLET(20 MG) BY MOUTH DAILY     Cardiovascular:  Antilipid - Statins Failed - 02/24/2024  1:43 PM      Failed - Lipid Panel in normal range within the last 12 months    Cholesterol  Date Value Ref Range Status  12/21/2023 174 <200 mg/dL Final   LDL Cholesterol (Calc)  Date Value Ref Range Status  12/21/2023 73 mg/dL (calc) Final    Comment:    Reference range: <100 . Desirable range <100 mg/dL for primary prevention;   <70 mg/dL for patients with CHD or diabetic patients  with > or = 2 CHD risk factors. SABRA LDL-C is now calculated using the Martin-Hopkins  calculation, which is a validated  novel method providing  better accuracy than the Friedewald equation in the  estimation of LDL-C.  Gladis APPLETHWAITE et al. SANDREA. 7986;689(80): 2061-2068  (http://education.[employer].com/faq/FAQ164)    Direct LDL  Date Value Ref Range Status  03/27/2020 60.0 mg/dL Final    Comment:    Optimal:  <100 mg/dLNear or Above Optimal:  100-129 mg/dLBorderline High:  130-159 mg/dLHigh:  160-189 mg/dLVery High:  >190 mg/dL   HDL  Date Value Ref Range Status  12/21/2023 75 > OR = 50 mg/dL Final   Triglycerides  Date Value Ref Range Status  12/21/2023 189 (H) <150 mg/dL Final         Passed - Patient is not pregnant      Passed - Valid encounter within last 12 months    Recent Outpatient Visits           2 months ago Encounter for general adult medical examination with abnormal findings   [place] [facility] [place], Angeline ORN, NP

## 2024-03-15 ENCOUNTER — Other Ambulatory Visit: Payer: Self-pay | Admitting: Internal Medicine

## 2024-03-16 NOTE — Telephone Encounter (Signed)
 Requested Prescriptions  Pending Prescriptions Disp Refills   pantoprazole  (PROTONIX ) 40 MG tablet [Pharmacy Med Name: PANTOPRAZOLE  40MG  TABLETS] 90 tablet 2    Sig: TAKE 1 TABLET(40 MG) BY MOUTH DAILY     Gastroenterology: Proton Pump Inhibitors Passed - 03/16/2024  3:30 PM      Passed - Valid encounter within last 12 months    Recent Outpatient Visits           2 months ago Encounter for general adult medical examination with abnormal findings   [place] [facility] [place], Angeline ORN, NP

## 2024-04-09 ENCOUNTER — Other Ambulatory Visit: Payer: Self-pay | Admitting: Internal Medicine

## 2024-04-11 NOTE — Telephone Encounter (Signed)
 Too soon for refill, L[facility] 01/03/24 for 90 and 1 RF.  Requested Prescriptions  Pending Prescriptions Disp Refills   estradiol  (ESTRACE ) 0.5 MG tablet [Pharmacy Med Name: ESTRADIOL  0.5MG  TABLETS] 90 tablet 1    Sig: TAKE 1 TABLET(0.5 MG) BY MOUTH DAILY     OB/GYN:  Estrogens Failed - 04/11/2024 10:52 AM      Failed - Mammogram is up-to-date per Health Maintenance      Failed - Last BP in normal range    BP Readings from Last 1 Encounters:  12/28/23 (!) 126/91         Passed - Valid encounter within last 12 months    Recent Outpatient Visits           3 months ago Encounter for general adult medical examination with abnormal findings    [facility] [place], Angeline ORN, NP

## 2024-06-20 NOTE — Progress Notes (Deleted)
 Subjective:    Patient ID: [name], female    DOB: 03/18/1971, 53 y.o.   MRN: [id number]  HPI  Patient presents to clinic today for 68-month follow-up of chronic conditions.  HTN: Her BP today is 124/80.  She is taking amlodipine -olmesartan  as prescribed.  ECG from 12/2023 reviewed.  HLD: Her last LDL was 73, triglycerides 800, 12/2023.  She denies myalgias on atorvastatin .  She does not consume a low-fat diet.  GERD: She is not sure what triggers this.  She denies breakthrough pantoprazole .  Upper GI from 12/2007 reviewed.  Menopausal symptoms: She reports mainly hot flashes, mood swings and brain fog.  She is taking estrace  as prescribed.  She had been on clonidine  in the past.  Anxiety: Situational.  She is taking buspirone  as prescribed.  She is not currently seeing a therapist.  She denies depression, SI/HI.  Thrombocytosis/leukocytosis: Her last WBC count was 11.8, platelet count was 446, 12/2023.  She does not follow with hematology.  Prediabetes: Her last A1c was 6.2%, 12/2023.  She is not taking any oral diabetic medication at this time.  She does not check her sugars.  Review of Systems  Past Medical History:  Diagnosis Date   Allergy    Anxiety    GERD (gastroesophageal reflux disease)    Hyperlipidemia    Hypertension    PVC (premature ventricular contraction) 07/20/2016   Sinus tachycardia 07/20/2016    Current Outpatient Medications  Medication Sig Dispense Refill   acyclovir  ointment (ZOVIRAX ) 5 % Apply 1 Application topically every 3 (three) hours as needed (for breakouts).     amLODipine -olmesartan  (AZOR ) 10-40 MG tablet TAKE 1 TABLET BY MOUTH DAILY 90 tablet 1   atorvastatin  (LIPITOR) 20 MG tablet TAKE 1 TABLET(20 MG) BY MOUTH DAILY 90 tablet 1   busPIRone  (BUSPAR ) 5 MG tablet TAKE 1 TABLET(5 MG) BY MOUTH TWICE DAILY 180 tablet 0   estradiol  (ESTRACE ) 0.5 MG tablet Take 1 tablet (0.5 mg total) by mouth daily. 90 tablet 1   pantoprazole  (PROTONIX ) 40 MG  tablet TAKE 1 TABLET(40 MG) BY MOUTH DAILY 90 tablet 2   No current facility-administered medications for this visit.    Allergies  Allergen Reactions   Benazepril  Cough   Amlodipine  Besylate Cough    Other Reaction(s): Cough   Lisinopril Cough    Family History  Problem Relation Age of Onset   Hypertension Mother    Heart murmur Father    Ovarian cancer Paternal Grandmother    Lupus Sister    Colon cancer Neg Hx    Esophageal cancer Neg Hx    Pancreatic cancer Neg Hx    Rectal cancer Neg Hx    Stomach cancer Neg Hx     Social History   Socioeconomic History   Marital status: Married    Spouse name: Not on file   Number of children: Not on file   Years of education: Not on file   Highest education level: Not on file  Occupational History   Not on file  Tobacco Use   Smoking status: Former    Current packs/day: 0.30    Types: Cigarettes   Smokeless tobacco: Never   Tobacco comments:    black-n-milds   Vaping Use   Vaping status: Some Days   Substances: Flavoring  Substance and Sexual Activity   Alcohol use: Yes    Comment: occasional    Drug use: Yes    Types: Marijuana    Comment: occasional  Sexual activity: Yes    Birth control/protection: Surgical  Other Topics Concern   Not on file  Social History Narrative   Not on file   Social Drivers of Health   Tobacco Use: Low Risk (05/24/2024)   Received from [facility]   Patient History    Smoking Tobacco Use: Never    Smokeless Tobacco Use: Never    Passive Exposure: Never  Financial Resource Strain: Not on file  Food Insecurity: No Food Insecurity (05/24/2024)   Received from [facility]   Epic    Within the past 12 months, you worried that your food would run out before you got the money to buy more.: Never true    Within the past 12 months, the food you bought just didn't last and you didn't have money to get more.: Never true  Transportation Needs: No Transportation Needs (05/24/2024)    Received from [facility]    In the past 12 months, has lack of transportation kept you from medical appointments or from getting medications?: No    In the past 12 months, has lack of transportation kept you from meetings, work, or from getting things needed for daily living?: No  Physical Activity: Not on file  Stress: No Stress Concern Present (05/24/2024)   Received from [facility] - Occupational Stress Questionnaire    Do you feel stress - tense, restless, nervous, or anxious, or unable to sleep at night because your mind is troubled all the time - these days?: Not at all  Social Connections: Not on file  Intimate Partner Violence: Not At Risk (05/24/2024)   Received from [facility]   HITS    Over the last 12 months how often did your partner physically hurt you?: Never    Over the last 12 months how often did your partner insult you or talk down to you?: Never    Over the last 12 months how often did your partner threaten you with physical harm?: Never    Over the last 12 months how often did your partner scream or curse at you?: Never  Depression (PHQ2-9): Medium Risk (12/21/2023)   Depression (PHQ2-9)    PHQ-2 Score: 7  Alcohol Screen: Low Risk (01/05/2023)   Alcohol Screen    Last Alcohol Screening Score (AUDIT): 4  Housing: Low Risk (05/24/2024)   Received from [facility]    In the last 12 months, was there a time when you were not able to pay the mortgage or rent on time?: No    In the past 12 months, how many times have you moved where you were living?: 1    At any time in the past 12 months, were you homeless or living in a shelter (including now)?: No  Utilities: Not At Risk (05/24/2024)   Received from [facility]    In the past 12 months has the electric, gas, oil, or water company threatened to shut off services in your home?: No  Health Literacy: Not on file     Constitutional: Denies  fever, malaise, fatigue, headache or abrupt weight changes.  HEENT: Denies eye pain, eye redness, ear pain, ringing in the ears, wax buildup, runny nose, nasal congestion, bloody nose, or sore throat. Respiratory: Denies difficulty breathing, shortness of breath, cough or sputum production.   Cardiovascular: Denies chest pain, chest tightness, palpitations or swelling in the hands or feet.  Gastrointestinal: Denies abdominal pain, bloating, constipation, diarrhea or blood in the stool.  GU: Denies urgency, frequency, pain with urination, burning sensation, blood in urine, odor or discharge. Musculoskeletal: Denies decrease in range of motion, difficulty with gait, muscle pain or joint pain and swelling.  Skin: Denies redness, rashes, lesions or ulcercations.  Neurological: Patient reports hot flashes.  Denies dizziness, difficulty with memory, difficulty with speech or problems with balance and coordination.  Psych: Patient reports intermittent anxiety.  Denies depression, SI/HI.  No other specific complaints in a complete review of systems (except as listed in HPI above).     Objective:   Physical Exam   There were no vitals taken for this visit.  Wt Readings from Last 3 Encounters:  12/28/23 142 lb (64.4 kg)  12/21/23 142 lb 9.6 oz (64.7 kg)  08/17/23 150 lb (68 kg)    General: Appears her stated age, overweight, in NAD. Skin: Warm, dry and intact.  HEENT: Head: normal shape and size; Eyes: sclera white, no icterus, conjunctiva pink, PERRLA and EOMs intact; Cardiovascular: Normal rate and rhythm. S1,S2 noted.  No murmur, rubs or gallops noted. No JVD or BLE edema. No carotid bruits noted. Pulmonary/Chest: Normal effort and positive vesicular breath sounds. No respiratory distress. No wheezes, rales or ronchi noted.  Abdomen: Soft and nontender. Normal bowel sounds.  Musculoskeletal:  No difficulty with gait.  Neurological: Alert and oriented. Coordination normal.  Psychiatric:  Mood and affect normal. Behavior is normal. Judgment and thought content normal.    BMET    Component Value Date/Time   NA 139 12/28/2023 1320   K 3.5 12/28/2023 1320   CL 103 12/28/2023 1320   CO2 21 (L) 12/28/2023 1320   GLUCOSE 104 (H) 12/28/2023 1320   BUN 9 12/28/2023 1320   CREATININE 0.61 12/28/2023 1320   CREATININE 0.67 12/21/2023 1134   CALCIUM  9.8 12/28/2023 1320   GFRNONAA >60 12/28/2023 1320   GFRAA >60 02/06/2017 0548    Lipid Panel     Component Value Date/Time   CHOL 174 12/21/2023 1134   TRIG 189 (H) 12/21/2023 1134   HDL 75 12/21/2023 1134   CHOLHDL 2.3 12/21/2023 1134   VLDL 49.4 (H) 03/27/2020 1459   LDLCALC 73 12/21/2023 1134    CBC    Component Value Date/Time   WBC 11.8 (H) 12/28/2023 1320   RBC 4.24 12/28/2023 1320   HGB 12.7 12/28/2023 1320   HGB 12.3 05/10/2007 1305   HCT 38.7 12/28/2023 1320   HCT 35.8 05/10/2007 1305   PLT 446 (H) 12/28/2023 1320   PLT 515 (H) 05/10/2007 1305   MCV 91.3 12/28/2023 1320   MCV 82.8 05/10/2007 1305   MCH 30.0 12/28/2023 1320   MCHC 32.8 12/28/2023 1320   RDW 14.7 12/28/2023 1320   RDW 15.1 (H) 05/10/2007 1305   LYMPHSABS 2.2 12/28/2023 1320   LYMPHSABS 2.4 05/10/2007 1305   MONOABS 0.5 12/28/2023 1320   MONOABS 0.4 05/10/2007 1305   EOSABS 0.0 12/28/2023 1320   EOSABS 0.1 05/10/2007 1305   BASOSABS 0.0 12/28/2023 1320   BASOSABS 0.0 05/10/2007 1305    Hgb A1C Lab Results  Component Value Date   HGBA1C 6.2 (H) 12/21/2023           Assessment & Plan:     RTC in 6 months for your annual exam [name], NP

## 2024-06-21 ENCOUNTER — Ambulatory Visit: Admitting: Internal Medicine

## 2024-07-06 NOTE — Progress Notes (Deleted)
 Subjective:    Patient ID: [name], female    DOB: 03/18/1971, 54 y.o.   MRN: [id number]  HPI  Patient presents to clinic today for 68-month follow-up of chronic conditions.  HTN: Her BP today is 124/80.  She is taking amlodipine -olmesartan  as prescribed.  ECG from 12/2023 reviewed.  HLD: Her last LDL was 73, triglycerides 800, 12/2023.  She denies myalgias on atorvastatin .  She does not consume a low-fat diet.  GERD: She is not sure what triggers this.  She denies breakthrough pantoprazole .  Upper GI from 12/2007 reviewed.  Menopausal symptoms: She reports mainly hot flashes, mood swings and brain fog.  She is taking estrace  as prescribed.  She had been on clonidine  in the past.  Anxiety: Situational.  She is taking buspirone  as prescribed.  She is not currently seeing a therapist.  She denies depression, SI/HI.  Thrombocytosis/leukocytosis: Her last WBC count was 11.8, platelet count was 446, 12/2023.  She does not follow with hematology.  Prediabetes: Her last A1c was 6.2%, 12/2023.  She is not taking any oral diabetic medication at this time.  She does not check her sugars.  Review of Systems  Past Medical History:  Diagnosis Date   Allergy    Anxiety    GERD (gastroesophageal reflux disease)    Hyperlipidemia    Hypertension    PVC (premature ventricular contraction) 07/20/2016   Sinus tachycardia 07/20/2016    Current Outpatient Medications  Medication Sig Dispense Refill   acyclovir  ointment (ZOVIRAX ) 5 % Apply 1 Application topically every 3 (three) hours as needed (for breakouts).     amLODipine -olmesartan  (AZOR ) 10-40 MG tablet TAKE 1 TABLET BY MOUTH DAILY 90 tablet 1   atorvastatin  (LIPITOR) 20 MG tablet TAKE 1 TABLET(20 MG) BY MOUTH DAILY 90 tablet 1   busPIRone  (BUSPAR ) 5 MG tablet TAKE 1 TABLET(5 MG) BY MOUTH TWICE DAILY 180 tablet 0   estradiol  (ESTRACE ) 0.5 MG tablet Take 1 tablet (0.5 mg total) by mouth daily. 90 tablet 1   pantoprazole  (PROTONIX ) 40 MG  tablet TAKE 1 TABLET(40 MG) BY MOUTH DAILY 90 tablet 2   No current facility-administered medications for this visit.    Allergies  Allergen Reactions   Benazepril  Cough   Amlodipine  Besylate Cough    Other Reaction(s): Cough   Lisinopril Cough    Family History  Problem Relation Age of Onset   Hypertension Mother    Heart murmur Father    Ovarian cancer Paternal Grandmother    Lupus Sister    Colon cancer Neg Hx    Esophageal cancer Neg Hx    Pancreatic cancer Neg Hx    Rectal cancer Neg Hx    Stomach cancer Neg Hx     Social History   Socioeconomic History   Marital status: Married    Spouse name: Not on file   Number of children: Not on file   Years of education: Not on file   Highest education level: Not on file  Occupational History   Not on file  Tobacco Use   Smoking status: Former    Current packs/day: 0.30    Types: Cigarettes   Smokeless tobacco: Never   Tobacco comments:    black-n-milds   Vaping Use   Vaping status: Some Days   Substances: Flavoring  Substance and Sexual Activity   Alcohol use: Yes    Comment: occasional    Drug use: Yes    Types: Marijuana    Comment: occasional  Sexual activity: Yes    Birth control/protection: Surgical  Other Topics Concern   Not on file  Social History Narrative   Not on file   Social Drivers of Health   Tobacco Use: Low Risk (05/24/2024)   Received from [facility]   Patient History    Smoking Tobacco Use: Never    Smokeless Tobacco Use: Never    Passive Exposure: Never  Financial Resource Strain: Not on file  Food Insecurity: No Food Insecurity (05/24/2024)   Received from [facility]   Epic    Within the past 12 months, you worried that your food would run out before you got the money to buy more.: Never true    Within the past 12 months, the food you bought just didn't last and you didn't have money to get more.: Never true  Transportation Needs: No Transportation Needs (05/24/2024)    Received from [facility]    In the past 12 months, has lack of transportation kept you from medical appointments or from getting medications?: No    In the past 12 months, has lack of transportation kept you from meetings, work, or from getting things needed for daily living?: No  Physical Activity: Not on file  Stress: No Stress Concern Present (05/24/2024)   Received from [facility] - Occupational Stress Questionnaire    Do you feel stress - tense, restless, nervous, or anxious, or unable to sleep at night because your mind is troubled all the time - these days?: Not at all  Social Connections: Not on file  Intimate Partner Violence: Not At Risk (05/24/2024)   Received from [facility]   HITS    Over the last 12 months how often did your partner physically hurt you?: Never    Over the last 12 months how often did your partner insult you or talk down to you?: Never    Over the last 12 months how often did your partner threaten you with physical harm?: Never    Over the last 12 months how often did your partner scream or curse at you?: Never  Depression (PHQ2-9): Medium Risk (12/21/2023)   Depression (PHQ2-9)    PHQ-2 Score: 7  Alcohol Screen: Low Risk (01/05/2023)   Alcohol Screen    Last Alcohol Screening Score (AUDIT): 4  Housing: Low Risk (05/24/2024)   Received from [facility]    In the last 12 months, was there a time when you were not able to pay the mortgage or rent on time?: No    In the past 12 months, how many times have you moved where you were living?: 1    At any time in the past 12 months, were you homeless or living in a shelter (including now)?: No  Utilities: Not At Risk (05/24/2024)   Received from [facility]    In the past 12 months has the electric, gas, oil, or water company threatened to shut off services in your home?: No  Health Literacy: Not on file     Constitutional: Denies  fever, malaise, fatigue, headache or abrupt weight changes.  HEENT: Denies eye pain, eye redness, ear pain, ringing in the ears, wax buildup, runny nose, nasal congestion, bloody nose, or sore throat. Respiratory: Denies difficulty breathing, shortness of breath, cough or sputum production.   Cardiovascular: Denies chest pain, chest tightness, palpitations or swelling in the hands or feet.  Gastrointestinal: Denies abdominal pain, bloating, constipation, diarrhea or blood in the stool.  GU: Denies urgency, frequency, pain with urination, burning sensation, blood in urine, odor or discharge. Musculoskeletal: Denies decrease in range of motion, difficulty with gait, muscle pain or joint pain and swelling.  Skin: Denies redness, rashes, lesions or ulcercations.  Neurological: Patient reports hot flashes.  Denies dizziness, difficulty with memory, difficulty with speech or problems with balance and coordination.  Psych: Patient reports intermittent anxiety.  Denies depression, SI/HI.  No other specific complaints in a complete review of systems (except as listed in HPI above).     Objective:   Physical Exam   There were no vitals taken for this visit.  Wt Readings from Last 3 Encounters:  12/28/23 142 lb (64.4 kg)  12/21/23 142 lb 9.6 oz (64.7 kg)  08/17/23 150 lb (68 kg)    General: Appears her stated age, overweight, in NAD. Skin: Warm, dry and intact.  HEENT: Head: normal shape and size; Eyes: sclera white, no icterus, conjunctiva pink, PERRLA and EOMs intact; Cardiovascular: Normal rate and rhythm. S1,S2 noted.  No murmur, rubs or gallops noted. No JVD or BLE edema. No carotid bruits noted. Pulmonary/Chest: Normal effort and positive vesicular breath sounds. No respiratory distress. No wheezes, rales or ronchi noted.  Abdomen: Soft and nontender. Normal bowel sounds.  Musculoskeletal:  No difficulty with gait.  Neurological: Alert and oriented. Coordination normal.  Psychiatric:  Mood and affect normal. Behavior is normal. Judgment and thought content normal.    BMET    Component Value Date/Time   NA 139 12/28/2023 1320   K 3.5 12/28/2023 1320   CL 103 12/28/2023 1320   CO2 21 (L) 12/28/2023 1320   GLUCOSE 104 (H) 12/28/2023 1320   BUN 9 12/28/2023 1320   CREATININE 0.61 12/28/2023 1320   CREATININE 0.67 12/21/2023 1134   CALCIUM  9.8 12/28/2023 1320   GFRNONAA >60 12/28/2023 1320   GFRAA >60 02/06/2017 0548    Lipid Panel     Component Value Date/Time   CHOL 174 12/21/2023 1134   TRIG 189 (H) 12/21/2023 1134   HDL 75 12/21/2023 1134   CHOLHDL 2.3 12/21/2023 1134   VLDL 49.4 (H) 03/27/2020 1459   LDLCALC 73 12/21/2023 1134    CBC    Component Value Date/Time   WBC 11.8 (H) 12/28/2023 1320   RBC 4.24 12/28/2023 1320   HGB 12.7 12/28/2023 1320   HGB 12.3 05/10/2007 1305   HCT 38.7 12/28/2023 1320   HCT 35.8 05/10/2007 1305   PLT 446 (H) 12/28/2023 1320   PLT 515 (H) 05/10/2007 1305   MCV 91.3 12/28/2023 1320   MCV 82.8 05/10/2007 1305   MCH 30.0 12/28/2023 1320   MCHC 32.8 12/28/2023 1320   RDW 14.7 12/28/2023 1320   RDW 15.1 (H) 05/10/2007 1305   LYMPHSABS 2.2 12/28/2023 1320   LYMPHSABS 2.4 05/10/2007 1305   MONOABS 0.5 12/28/2023 1320   MONOABS 0.4 05/10/2007 1305   EOSABS 0.0 12/28/2023 1320   EOSABS 0.1 05/10/2007 1305   BASOSABS 0.0 12/28/2023 1320   BASOSABS 0.0 05/10/2007 1305    Hgb A1C Lab Results  Component Value Date   HGBA1C 6.2 (H) 12/21/2023           Assessment & Plan:     RTC in 6 months for your annual exam [name], NP

## 2024-07-07 ENCOUNTER — Ambulatory Visit: Admitting: Internal Medicine

## 2024-07-07 ENCOUNTER — Telehealth: Payer: Self-pay

## 2024-07-07 NOTE — Telephone Encounter (Signed)
"  Appointment?   "

## 2024-07-10 ENCOUNTER — Encounter: Payer: Self-pay | Admitting: Internal Medicine

## 2024-07-10 ENCOUNTER — Telehealth: Admitting: Internal Medicine

## 2024-07-10 DIAGNOSIS — R7303 Prediabetes: Secondary | ICD-10-CM | POA: Diagnosis not present

## 2024-07-10 DIAGNOSIS — E782 Mixed hyperlipidemia: Secondary | ICD-10-CM | POA: Diagnosis not present

## 2024-07-10 DIAGNOSIS — I10 Essential (primary) hypertension: Secondary | ICD-10-CM

## 2024-07-10 DIAGNOSIS — N951 Menopausal and female climacteric states: Secondary | ICD-10-CM

## 2024-07-10 DIAGNOSIS — F418 Other specified anxiety disorders: Secondary | ICD-10-CM | POA: Diagnosis not present

## 2024-07-10 DIAGNOSIS — K219 Gastro-esophageal reflux disease without esophagitis: Secondary | ICD-10-CM

## 2024-07-10 DIAGNOSIS — D75839 Thrombocytosis, unspecified: Secondary | ICD-10-CM | POA: Diagnosis not present

## 2024-07-10 NOTE — Assessment & Plan Note (Signed)
 Continue buspirone  5 mg twice daily as needed Support offered

## 2024-07-10 NOTE — Assessment & Plan Note (Signed)
 C-Met and lipid profile today Encouraged to consume a low-fat diet Continue atorvastatin  20 mg daily

## 2024-07-10 NOTE — Assessment & Plan Note (Signed)
 Try to identify and avoid foods that trigger reflux Encourage weight loss as this can help reduce reflux symptoms She will hold off on pantoprazole  40 mg daily and try to take this only as needed

## 2024-07-10 NOTE — Assessment & Plan Note (Signed)
 A1c today Encourage low-carb diet and exercise for weight loss

## 2024-07-10 NOTE — Progress Notes (Signed)
 Virtual Visit via Video Note  I connected with [name] on 07/10/2024 at  4:00 PM EST by a video enabled telemedicine application and verified that I am speaking with the correct person using two identifiers.  Location: Patient: Home Provider: Office  Person's participating in this video call: [name], NP-C and Remmi Armenteros   I discussed the limitations of evaluation and management by telemedicine and the availability of in person appointments. The patient expressed understanding and agreed to proceed.  History of Present Illness:  Patient due for follow-up of chronic conditions.  Patient presents to clinic today for 58-month follow-up of chronic conditions.  HTN: Her BP today is 124/80. She does check her BP at home. She is taking amlodipine -olmesartan  as prescribed.  ECG from 12/2023 reviewed.  HLD: Her last LDL was 73, triglycerides 800, 12/2023.  She denies myalgias on atorvastatin , but she has had some joint pains.  She does not consume a low-fat diet.  GERD: She is not sure what triggers this.  She denies breakthrough pantoprazole  but she reports she ran out 3 days ago and has not noticed a difference in her symptoms.  Upper GI from 12/2007 reviewed.  Menopausal symptoms: She reports mainly hot flashes, mood swings and brain fog.  She is taking estrace  as prescribed and does feel like this has improved.  She had been on clonidine  in the past.  Anxiety: Situational.  She is taking buspirone  as prescribed.  She is not currently seeing a therapist.  She denies depression, SI/HI.  Thrombocytosis/leukocytosis: Her last WBC count was 11.8, platelet count was 446, 12/2023.  She does not follow with hematology.  Prediabetes: Her last A1c was 6.2%, 12/2023.  She is not taking any oral diabetic medication at this time.  She does not check her sugars.  Past Medical History:  Diagnosis Date   Allergy    Anxiety    GERD (gastroesophageal reflux disease)    Hyperlipidemia     Hypertension    PVC (premature ventricular contraction) 07/20/2016   Sinus tachycardia 07/20/2016    Current Outpatient Medications  Medication Sig Dispense Refill   acyclovir  ointment (ZOVIRAX ) 5 % Apply 1 Application topically every 3 (three) hours as needed (for breakouts).     amLODipine -olmesartan  (AZOR ) 10-40 MG tablet TAKE 1 TABLET BY MOUTH DAILY 90 tablet 1   atorvastatin  (LIPITOR) 20 MG tablet TAKE 1 TABLET(20 MG) BY MOUTH DAILY 90 tablet 1   busPIRone  (BUSPAR ) 5 MG tablet TAKE 1 TABLET(5 MG) BY MOUTH TWICE DAILY 180 tablet 0   estradiol  (ESTRACE ) 0.5 MG tablet Take 1 tablet (0.5 mg total) by mouth daily. 90 tablet 1   pantoprazole  (PROTONIX ) 40 MG tablet TAKE 1 TABLET(40 MG) BY MOUTH DAILY 90 tablet 2   No current facility-administered medications for this visit.    Allergies[1]  Family History  Problem Relation Age of Onset   Hypertension Mother    Heart murmur Father    Ovarian cancer Paternal Grandmother    Lupus Sister    Colon cancer Neg Hx    Esophageal cancer Neg Hx    Pancreatic cancer Neg Hx    Rectal cancer Neg Hx    Stomach cancer Neg Hx     Social History   Socioeconomic History   Marital status: Married    Spouse name: Not on file   Number of children: Not on file   Years of education: Not on file   Highest education level: Not on file  Occupational History  Not on file  Tobacco Use   Smoking status: Former    Current packs/day: 0.30    Types: Cigarettes   Smokeless tobacco: Never   Tobacco comments:    black-n-milds   Vaping Use   Vaping status: Some Days   Substances: Flavoring  Substance and Sexual Activity   Alcohol use: Yes    Comment: occasional    Drug use: Yes    Types: Marijuana    Comment: occasional    Sexual activity: Yes    Birth control/protection: Surgical  Other Topics Concern   Not on file  Social History Narrative   Not on file   Social Drivers of Health   Tobacco Use: Low Risk (05/24/2024)   Received from  [facility]   Patient History    Smoking Tobacco Use: Never    Smokeless Tobacco Use: Never    Passive Exposure: Never  Financial Resource Strain: Not on file  Food Insecurity: No Food Insecurity (05/24/2024)   Received from [facility]   Epic    Within the past 12 months, you worried that your food would run out before you got the money to buy more.: Never true    Within the past 12 months, the food you bought just didn't last and you didn't have money to get more.: Never true  Transportation Needs: No Transportation Needs (05/24/2024)   Received from [facility]    In the past 12 months, has lack of transportation kept you from medical appointments or from getting medications?: No    In the past 12 months, has lack of transportation kept you from meetings, work, or from getting things needed for daily living?: No  Physical Activity: Not on file  Stress: No Stress Concern Present (05/24/2024)   Received from [facility] - Occupational Stress Questionnaire    Do you feel stress - tense, restless, nervous, or anxious, or unable to sleep at night because your mind is troubled all the time - these days?: Not at all  Social Connections: Not on file  Intimate Partner Violence: Not At Risk (05/24/2024)   Received from [facility]   HITS    Over the last 12 months how often did your partner physically hurt you?: Never    Over the last 12 months how often did your partner insult you or talk down to you?: Never    Over the last 12 months how often did your partner threaten you with physical harm?: Never    Over the last 12 months how often did your partner scream or curse at you?: Never  Depression (PHQ2-9): Medium Risk (12/21/2023)   Depression (PHQ2-9)    PHQ-2 Score: 7  Alcohol Screen: Low Risk (01/05/2023)   Alcohol Screen    Last Alcohol Screening Score (AUDIT): 4  Housing: Low Risk (05/24/2024)   Received from [facility]    In the last 12 months, was there a time when you were not able to pay the mortgage or rent on time?: No    In the past 12 months, how many times have you moved where you were living?: 1    At any time in the past 12 months, were you homeless or living in a shelter (including now)?: No  Utilities: Not At Risk (05/24/2024)   Received from [facility]    In the past 12 months has the electric, gas, oil, or  water company threatened to shut off services in your home?: No  Health Literacy: Not on file     Constitutional: Denies fever, malaise, fatigue, headache or abrupt weight changes.  HEENT: Denies eye pain, eye redness, ear pain, ringing in the ears, wax buildup, runny nose, nasal congestion, bloody nose, or sore throat. Respiratory: Denies difficulty breathing, shortness of breath, cough or sputum production.   Cardiovascular: Denies chest pain, chest tightness, palpitations or swelling in the hands or feet.  Gastrointestinal: Denies abdominal pain, bloating, constipation, diarrhea or blood in the stool.  GU: Denies urgency, frequency, pain with urination, burning sensation, blood in urine, odor or discharge. Musculoskeletal: Denies decrease in range of motion, difficulty with gait, muscle pain or joint pain and swelling.  Skin: Denies redness, rashes, lesions or ulcercations.  Neurological: Denies dizziness, difficulty with memory, difficulty with speech or problems with balance and coordination.  Psych: Patient has a history of anxiety.  Denies depression, SI/HI.  No other specific complaints in a complete review of systems (except as listed in HPI above).  Observations/Objective:   Wt Readings from Last 3 Encounters:  12/28/23 142 lb (64.4 kg)  12/21/23 142 lb 9.6 oz (64.7 kg)  08/17/23 150 lb (68 kg)    General: Appears her stated age, well developed, well nourished in NAD. Pulmonary/Chest: Normal effort. No respiratory distress.  Neurological: Alert and  oriented. Coordination normal.  Psychiatric: Mood and affect normal. Behavior is normal. Judgment and thought content normal.     BMET    Component Value Date/Time   NA 139 12/28/2023 1320   K 3.5 12/28/2023 1320   CL 103 12/28/2023 1320   CO2 21 (L) 12/28/2023 1320   GLUCOSE 104 (H) 12/28/2023 1320   BUN 9 12/28/2023 1320   CREATININE 0.61 12/28/2023 1320   CREATININE 0.67 12/21/2023 1134   CALCIUM  9.8 12/28/2023 1320   GFRNONAA >60 12/28/2023 1320   GFRAA >60 02/06/2017 0548    Lipid Panel     Component Value Date/Time   CHOL 174 12/21/2023 1134   TRIG 189 (H) 12/21/2023 1134   HDL 75 12/21/2023 1134   CHOLHDL 2.3 12/21/2023 1134   VLDL 49.4 (H) 03/27/2020 1459   LDLCALC 73 12/21/2023 1134    CBC    Component Value Date/Time   WBC 11.8 (H) 12/28/2023 1320   RBC 4.24 12/28/2023 1320   HGB 12.7 12/28/2023 1320   HGB 12.3 05/10/2007 1305   HCT 38.7 12/28/2023 1320   HCT 35.8 05/10/2007 1305   PLT 446 (H) 12/28/2023 1320   PLT 515 (H) 05/10/2007 1305   MCV 91.3 12/28/2023 1320   MCV 82.8 05/10/2007 1305   MCH 30.0 12/28/2023 1320   MCHC 32.8 12/28/2023 1320   RDW 14.7 12/28/2023 1320   RDW 15.1 (H) 05/10/2007 1305   LYMPHSABS 2.2 12/28/2023 1320   LYMPHSABS 2.4 05/10/2007 1305   MONOABS 0.5 12/28/2023 1320   MONOABS 0.4 05/10/2007 1305   EOSABS 0.0 12/28/2023 1320   EOSABS 0.1 05/10/2007 1305   BASOSABS 0.0 12/28/2023 1320   BASOSABS 0.0 05/10/2007 1305    Hgb A1C Lab Results  Component Value Date   HGBA1C 6.2 (H) 12/21/2023       Assessment and Plan:  RTC in 5 months for your annual exam  Follow Up Instructions:    I discussed the assessment and treatment plan with the patient. The patient was provided an opportunity to ask questions and all were answered. The patient agreed with the plan and  demonstrated an understanding of the instructions.   The patient was advised to call back or seek an in-person evaluation if the symptoms worsen or if  the condition fails to improve as anticipated.   [name], NP     [1]  Allergies Allergen Reactions   Benazepril  Cough   Amlodipine  Besylate Cough    Other Reaction(s): Cough   Lisinopril Cough

## 2024-07-10 NOTE — Assessment & Plan Note (Signed)
 CBC today.

## 2024-07-10 NOTE — Assessment & Plan Note (Signed)
Continue Estrace 0.5 mg daily

## 2024-07-10 NOTE — Assessment & Plan Note (Signed)
 Controlled on amlodipine -olmesartan  10-40 mg daily Reinforced DASH diet and exercise for weight loss C-Met today

## 2024-07-10 NOTE — Patient Instructions (Signed)

## 2024-07-17 ENCOUNTER — Encounter: Payer: Self-pay | Admitting: Internal Medicine

## 2024-12-25 ENCOUNTER — Encounter: Admitting: Internal Medicine
